# Patient Record
Sex: Female | Born: 1950 | Race: Black or African American | Hispanic: No | Marital: Single | Smoking: Never smoker
Health system: Southern US, Community
[De-identification: ages and names within clinical notes are randomized; demographics above are authoritative.]

## PROBLEM LIST (undated history)

## (undated) DIAGNOSIS — J42 Unspecified chronic bronchitis: Secondary | ICD-10-CM

## (undated) DIAGNOSIS — T7840XA Allergy, unspecified, initial encounter: Secondary | ICD-10-CM

## (undated) DIAGNOSIS — E119 Type 2 diabetes mellitus without complications: Secondary | ICD-10-CM

## (undated) DIAGNOSIS — M1990 Unspecified osteoarthritis, unspecified site: Secondary | ICD-10-CM

## (undated) DIAGNOSIS — I10 Essential (primary) hypertension: Secondary | ICD-10-CM

## (undated) DIAGNOSIS — B019 Varicella without complication: Secondary | ICD-10-CM

## (undated) DIAGNOSIS — I2699 Other pulmonary embolism without acute cor pulmonale: Secondary | ICD-10-CM

## (undated) DIAGNOSIS — I809 Phlebitis and thrombophlebitis of unspecified site: Secondary | ICD-10-CM

## (undated) DIAGNOSIS — R42 Dizziness and giddiness: Secondary | ICD-10-CM

## (undated) HISTORY — PX: OTHER SURGICAL HISTORY: SHX169

## (undated) HISTORY — DX: Phlebitis and thrombophlebitis of unspecified site: I80.9

## (undated) HISTORY — DX: Unspecified chronic bronchitis: J42

## (undated) HISTORY — PX: TONSILECTOMY, ADENOIDECTOMY, BILATERAL MYRINGOTOMY AND TUBES: SHX2538

## (undated) HISTORY — DX: Unspecified osteoarthritis, unspecified site: M19.90

## (undated) HISTORY — DX: Type 2 diabetes mellitus without complications: E11.9

## (undated) HISTORY — DX: Allergy, unspecified, initial encounter: T78.40XA

## (undated) HISTORY — PX: ABDOMINAL HYSTERECTOMY: SHX81

## (undated) HISTORY — DX: Varicella without complication: B01.9

---

## 1988-09-26 DIAGNOSIS — I2699 Other pulmonary embolism without acute cor pulmonale: Secondary | ICD-10-CM

## 1988-09-26 HISTORY — DX: Other pulmonary embolism without acute cor pulmonale: I26.99

## 2001-06-11 ENCOUNTER — Encounter: Payer: Self-pay | Admitting: Occupational Therapy

## 2001-06-11 ENCOUNTER — Ambulatory Visit (HOSPITAL_COMMUNITY): Admission: RE | Admit: 2001-06-11 | Discharge: 2001-06-11 | Payer: Self-pay | Admitting: Occupational Therapy

## 2002-08-07 ENCOUNTER — Ambulatory Visit (HOSPITAL_COMMUNITY): Admission: RE | Admit: 2002-08-07 | Discharge: 2002-08-07 | Payer: Self-pay | Admitting: General Surgery

## 2002-08-07 ENCOUNTER — Encounter: Payer: Self-pay | Admitting: General Surgery

## 2002-09-25 ENCOUNTER — Ambulatory Visit (HOSPITAL_COMMUNITY): Admission: RE | Admit: 2002-09-25 | Discharge: 2002-09-25 | Payer: Self-pay | Admitting: General Surgery

## 2007-04-12 LAB — HM COLONOSCOPY: HM Colonoscopy: NORMAL

## 2012-10-19 LAB — HM DIABETES EYE EXAM

## 2013-04-11 ENCOUNTER — Ambulatory Visit (INDEPENDENT_AMBULATORY_CARE_PROVIDER_SITE_OTHER): Payer: Commercial Managed Care - PPO | Admitting: Internal Medicine

## 2013-04-11 ENCOUNTER — Encounter: Payer: Self-pay | Admitting: Internal Medicine

## 2013-04-11 VITALS — BP 170/90 | HR 87 | Temp 98.1°F | Resp 12 | Ht 60.0 in | Wt 219.2 lb

## 2013-04-11 DIAGNOSIS — Z1211 Encounter for screening for malignant neoplasm of colon: Secondary | ICD-10-CM

## 2013-04-11 DIAGNOSIS — R5381 Other malaise: Secondary | ICD-10-CM

## 2013-04-11 DIAGNOSIS — E559 Vitamin D deficiency, unspecified: Secondary | ICD-10-CM

## 2013-04-11 DIAGNOSIS — M25569 Pain in unspecified knee: Secondary | ICD-10-CM

## 2013-04-11 DIAGNOSIS — IMO0001 Reserved for inherently not codable concepts without codable children: Secondary | ICD-10-CM

## 2013-04-11 DIAGNOSIS — D649 Anemia, unspecified: Secondary | ICD-10-CM

## 2013-04-11 DIAGNOSIS — E785 Hyperlipidemia, unspecified: Secondary | ICD-10-CM

## 2013-04-11 DIAGNOSIS — E538 Deficiency of other specified B group vitamins: Secondary | ICD-10-CM

## 2013-04-11 DIAGNOSIS — R609 Edema, unspecified: Secondary | ICD-10-CM

## 2013-04-11 DIAGNOSIS — E119 Type 2 diabetes mellitus without complications: Secondary | ICD-10-CM

## 2013-04-11 DIAGNOSIS — I10 Essential (primary) hypertension: Secondary | ICD-10-CM

## 2013-04-11 DIAGNOSIS — Z1239 Encounter for other screening for malignant neoplasm of breast: Secondary | ICD-10-CM

## 2013-04-11 LAB — MICROALBUMIN / CREATININE URINE RATIO
Creatinine,U: 97.5 mg/dL
Microalb Creat Ratio: 0.8 mg/g (ref 0.0–30.0)
Microalb, Ur: 0.8 mg/dL (ref 0.0–1.9)

## 2013-04-11 LAB — COMPREHENSIVE METABOLIC PANEL
ALT: 20 U/L (ref 0–35)
AST: 18 U/L (ref 0–37)
Albumin: 4 g/dL (ref 3.5–5.2)
Alkaline Phosphatase: 71 U/L (ref 39–117)
BUN: 16 mg/dL (ref 6–23)
CO2: 26 mEq/L (ref 19–32)
Calcium: 9.1 mg/dL (ref 8.4–10.5)
Chloride: 103 mEq/L (ref 96–112)
Creatinine, Ser: 0.7 mg/dL (ref 0.4–1.2)
GFR: 114.17 mL/min (ref >60.00)
Glucose, Bld: 147 mg/dL — ABNORMAL HIGH (ref 70–99)
Potassium: 4.2 mEq/L (ref 3.5–5.1)
Sodium: 138 mEq/L (ref 135–145)
Total Bilirubin: 0.2 mg/dL — ABNORMAL LOW (ref 0.3–1.2)
Total Protein: 7.4 g/dL (ref 6.0–8.3)

## 2013-04-11 LAB — CBC WITH DIFFERENTIAL/PLATELET
Basophils Absolute: 0 10*3/uL (ref 0.0–0.1)
Hemoglobin: 12.5 g/dL (ref 12.0–15.0)
Lymphocytes Relative: 29.2 % (ref 12.0–46.0)
Monocytes Relative: 4.5 % (ref 3.0–12.0)
Neutro Abs: 5.8 10*3/uL (ref 1.4–7.7)
Neutrophils Relative %: 65.4 % (ref 43.0–77.0)
RDW: 14.8 % — ABNORMAL HIGH (ref 11.5–14.6)

## 2013-04-11 LAB — HEMOGLOBIN A1C: Hgb A1c MFr Bld: 8.8 % — ABNORMAL HIGH (ref 4.6–6.5)

## 2013-04-11 LAB — LIPID PANEL
Cholesterol: 256 mg/dL — ABNORMAL HIGH (ref 0–200)
HDL: 45.2 mg/dL (ref >39.00)
Total CHOL/HDL Ratio: 6
Triglycerides: 264 mg/dL — ABNORMAL HIGH (ref 0.0–149.0)
VLDL: 52.8 mg/dL — ABNORMAL HIGH (ref 0.0–40.0)

## 2013-04-11 LAB — HM DIABETES FOOT EXAM: HM Diabetic Foot Exam: NORMAL

## 2013-04-11 LAB — IRON AND TIBC: %SAT: 13 % — ABNORMAL LOW (ref 20–55)

## 2013-04-11 LAB — VITAMIN B12: Vitamin B-12: 155 pg/mL — ABNORMAL LOW (ref 211–911)

## 2013-04-11 LAB — FERRITIN: Ferritin: 56.9 ng/mL (ref 10.0–291.0)

## 2013-04-11 NOTE — Patient Instructions (Addendum)
You need one calcium tablet daily ,  And 800 units of Vitamin D  Daily unless your level is low  We are checking your labs today and will set you up for mammogram  If you need to start medications, we will call you and may have you return earlier to discuss medications  Otherwise we will see you in 3 months for your physical   This is  One version of a  "Low GI"  Diet:  It will still lower your blood sugars and allow you to lose 4 to 8  lbs  per month if you follow it carefully.  Your goal with exercise is a minimum of 30 minutes of aerobic exercise 5 days per week (Walking does not count once it becomes easy!)    All of the foods can be found at grocery stores and in bulk at [employer].  The Atkins protein bars and shakes are available in more varieties at Target, WalMart and [employer]'s Foods.     7 AM Breakfast:  Choose from the following:  Low carbohydrate Protein  Shakes (I recommend the EAS AdvantEdge "Carb Control" shakes  Or the low carb shakes by Atkins.    2.5 carbs   Arnold's "Sandwhich Thin"toasted  w/ peanut butter (no jelly: about 20 net carbs  "Bagel Thin" with cream cheese and salmon: about 20 carbs   a scrambled egg/bacon/cheese burrito made with Mission's "carb balance" whole wheat tortilla  (about 10 net carbs )   Avoid cereal and bananas, oatmeal and cream of wheat and grits. They are loaded with carbohydrates!   10 AM: high protein snack  Protein bar by Atkins (the snack size, under 200 cal, usually < 6 net carbs).    A stick of cheese:  Around 1 carb,  100 cal     Dannon Light n Fit [place] Yogurt  (80 cal, 8 carbs)  Other so called "protein bars" and Greek yogurts tend to be loaded with carbohydrates.  Remember, in food advertising, the word "energy" is synonymous for " carbohydrate."  Lunch:   A Sandwich using the bread choices listed, Can use any  Eggs,  lunchmeat, grilled meat or canned tuna), avocado, regular mayo/mustard  and cheese.  A Salad using blue cheese,  ranch,  Goddess or vinagrette,  No croutons or "confetti" and no "candied nuts" but regular nuts OK.   No pretzels or chips.  Pickles and miniature sweet peppers are a good low carb alternative that provide a "crunch"  The bread is the only source of carbohydrate in a sandwich and  can be decreased by trying some of these alternatives to traditional loaf bread  Joseph's makes a pita bread and a flat bread that are 50 cal and 4 net carbs available at BJs and WalMart.  This can be toasted to use with hummous as well  Toufayan makes a low carb flatbread that's 100 cal and 9 net carbs available at [employer] and [employer] makes 2 sizes of  Low carb whole wheat tortilla  (The large one is 210 cal and 6 net carbs) Avoid "Low fat dressings, as well as Reyne Dumas and [address] dressings They are loaded with sugar!   3 PM/ Mid day  Snack:  Consider  1 ounce of  almonds, walnuts, pistachios, pecans, peanuts,  Macadamia nuts or a nut medley.  Avoid "granola"; the dried cranberries and raisins are loaded with carbohydrates. Mixed nuts as long as there are no raisins,  cranberries or dried fruit.     6 PM  Dinner:     Meat/fowl/fish with a green salad, and either broccoli, cauliflower, green beans, spinach, brussel sprouts or  Lima beans. DO NOT BREAD THE PROTEIN!!      There is a low carb pasta by Dreamfield's that is acceptable and tastes great: only 5 digestible carbs/serving.( All grocery stores but BJs carry it )  Try Kai Levins Angelo's chicken piccata or chicken or eggplant parm over low carb pasta.(Lowes and BJs)   Clifton Custard Sanchez's "Carnitas" (pulled pork, no sauce,  0 carbs) or his beef pot roast to make a dinner burrito (at BJ's)  Pesto over low carb pasta (bj's sells a good quality pesto in the center refrigerated section of the deli   Whole wheat pasta is still full of digestible carbs and  Not as low in glycemic index as Dreamfield's.   Brown rice is still rice,  So skip the rice and noodles  if you eat [place] or [place] (or at least limit to 1/2 cup)  9 PM snack :   Breyer's "low carb" fudgsicle or  ice cream bar (Carb Smart line), or  Weight Watcher's ice cream bar , or another "no sugar added" ice cream;  a serving of fresh berries/cherries with whipped cream   Cheese or DANNON'S LlGHT N FIT [place] YOGURT  Avoid bananas, pineapple, grapes  and watermelon on a regular basis because they are high in sugar.  THINK OF THEM AS DESSERT  Remember that snack Substitutions should be less than 10 NET carbs per serving and meals < 20 carbs. Remember to subtract fiber grams to get the "net carbs."

## 2013-04-11 NOTE — Progress Notes (Signed)
Patient ID: Jeanette Spencer, female   DOB: 07/28/1951, 62 y.o.   MRN: [id number]    Patient Active Problem List   Diagnosis Date Noted  . Other and unspecified hyperlipidemia 04/13/2013  . Essential hypertension, benign 04/13/2013  . Edema 04/13/2013  . Recurrent knee pain 04/13/2013  . Type II or unspecified type diabetes mellitus without mention of complication, uncontrolled     Subjective:  CC:   Chief Complaint  Patient presents with  . Establish Care    HPI:   Jeanette Spencer is a 62 y.o. female who presents as a new patient to establish primary care with the chief complaint of Multiple complaints related to history of DM2, hypertension, DJD knees, and obesity.   no treatment in 2 years due to loss of insurance.    Bilateral knee pain,  Feels like the patella moves and the catches frequently when she walks. 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  Has had ultrasounds remotely of left leg due to recurrent swelling of left knee and history of post operative DVT/PE.     Leg swelling: Uses compression stockings occasionally when the swelling is more pronounced but not on a daily basis.  Swelling is aggravated periodically by prolonged work hours of standing.    Uncontrolled dm.  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 Not following a low glycemic diet and has gained 37 lbs since then .  No neuropathic symptoms or urinary frequency.  nOt checking sugars .    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   Surgery was done at  [facility]).  She recalls thinking that the DVT may have been present prior to surgery based on history of red and blue  streaks noted in both legs prior to surgery but not evaluated prior to the surgery.  Was taking oral estrogen at the time.  Was anticoagulated for 6 years on coumadin.,  And rstogen odsoe was lowered but not discontinued by PCP.   Self discontinued it 18 years ago (after 6 years) along with the estrogen .   History of pneumonia x 2 as an infant,  Has had recurrent episodes of bronchitis since then,  No tobacco history .  Some allergic rhinitis, no episodes of  sinusitis in 2 years.  Not on a daily antihistamine or steroid nasal spray. Does not have daily symptoms .  History of obesity and Htn which resolved with wt loss years ago  Normal bp reading a few weeks ago at [facility] during an employee health screen .   Past Medical History  Diagnosis Date  . Arthritis   . Chronic bronchitis   . Allergy   . Phlebitis   . Chicken pox   . Diabetes mellitus without complication     Past Surgical History  Procedure Laterality Date  . Abdominal hysterectomy    . Tonsilectomy, adenoidectomy, bilateral myringotomy and tubes    . Abdominal tumor Left     size of grapefruit    Family History  Problem Relation Age of Onset  . Diabetes  Mother   . Cancer Father   . Cancer Paternal Aunt   . Cancer Paternal Uncle   . Diabetes Paternal Grandmother     History   Social History  . Marital Status: Legally Separated    Spouse Name: N/A    Number of Children: N/A  . Years of Education: N/A   Occupational History  . Not on file.   Social History Main Topics  . Smoking status: Never Smoker   . Smokeless tobacco: Never Used  . Alcohol Use: Yes     Comment: rarely  . Drug Use: No  . Sexually Active: No   Other Topics Concern  . Not on file   Social History Narrative  . No narrative on file       @ALLHX @    Review of Systems:   The remainder of the review of systems was negative except those addressed in the HPI.       Objective:  BP 170/90  Pulse 87  Temp(Src) 98.1 F  (36.7 C) (Oral)  Resp 12  Ht 5' (1.524 m)  Wt 219 lb 4 oz (99.451 kg)  BMI 42.82 kg/m2  SpO2 98%  General appearance: alert, cooperative and appears stated age Ears: normal TM's and external ear canals both ears Throat: lips, mucosa, and tongue normal; teeth and gums normal Neck: no adenopathy, no carotid bruit, supple, symmetrical, trachea midline and thyroid not enlarged, symmetric, no tenderness/mass/nodules Back: symmetric, no curvature. ROM normal. No CVA tenderness. Lungs: clear to auscultation bilaterally Heart: regular rate and rhythm, S1, S2 normal, no murmur, click, rub or gallop Abdomen: soft, non-tender; bowel sounds normal; no masses,  no organomegaly Pulses: 2+ and symmetric Skin: Skin color, texture, turgor normal. No rashes or lesions Lymph nodes: Cervical, supraclavicular, and axillary nodes normal.  Assessment and Plan:  Type II or unspecified type diabetes mellitus without mention of complication, uncontrolled a1c 8.8 secondary to lapse in medications and nonadherence to low glycemic index diet.  Her fasting glucose today is 147. She has normal renal function mild hyperlipidemia and obesity. She has no proteinuria but does have hypertension. We will resume metformin, start atorvastatin and lisinopril and have her return  In 6 weeks  For repeat cMET and lipids along with a log of fasting and postprandial blood sugars. We will consider adding additional therapy with glipizide if her  postprandial sugars are continually 150. She will be offered referral to diabetes education for nutritional counseling and weight management . She was counseled on the need to follow a low glycemic index diet and printed example was  given to her today.  I have recommended starting a daily baby aspirin. Referral for diabetic eye exam in process.  Other and unspecified hyperlipidemia Her untreated LDL 166 /  Trigs 260.  I am Recommending therapy with  lipitor 20 mg given concurrent DM and HTN.  She will return in 6 weeks for repeat fasting lipids and CMET  Essential hypertension, benign She has no evidence of proteinuria today but given her history of diabetes and hyperlipidemia recommend starting lisinopril 10 mg daily.  Edema Exam is consistent with venous insufficiency. She has a history of DVT remotely. She is no signs of heart failure on exam.  Normal pulses.. She has no proteinuria. Will confirmed with ultrasounds of both legs. Recommended continued use of compression stockings on a daily basis.  Recurrent knee pain She has no effusions on exam but does have some mild crepitus. Likely DJD as a cause. Will  defer workup for now. When necessary Aleve and Tylenol.   Updated Medication List Outpatient Encounter Prescriptions as of 04/11/2013  Medication Sig Dispense Refill  . atorvastatin (LIPITOR) 20 MG tablet Take 1 tablet (20 mg total) by mouth daily.  90 tablet  3  . lisinopril (PRINIVIL,ZESTRIL) 10 MG tablet Take 1 tablet (10 mg total) by mouth daily.  90 tablet  3  . metFORMIN (GLUCOPHAGE) 850 MG tablet Take 1 tablet (850 mg total) by mouth 2 (two) times daily with a meal.  180 tablet  3   No facility-administered encounter medications on file as of 04/11/2013.     Orders Placed This Encounter  Procedures  . Fecal occult blood, imunochemical  . MM Digital Screening  . HM MAMMOGRAPHY  . Lipid panel  . Hemoglobin A1c  . Microalbumin / creatinine urine ratio  . Comprehensive metabolic panel  . TSH  . CBC with Differential  . Vitamin B12  . Vitamin D 25 hydroxy  . Ferritin  . Iron and TIBC  . LDL cholesterol, direct  . Ambulatory referral to Ophthalmology  . Ambulatory referral to diabetic education  . HM DIABETES FOOT EXAM  . HM COLONOSCOPY    No Follow-up on file.

## 2013-04-12 LAB — VITAMIN D 25 HYDROXY (VIT D DEFICIENCY, FRACTURES): Vit D, 25-Hydroxy: 24 ng/mL — ABNORMAL LOW (ref 30–89)

## 2013-04-12 LAB — TSH: TSH: 1.68 u[IU]/mL (ref 0.35–5.50)

## 2013-04-12 LAB — LDL CHOLESTEROL, DIRECT: Direct LDL: 166.7 mg/dL

## 2013-04-13 ENCOUNTER — Encounter: Payer: Self-pay | Admitting: Internal Medicine

## 2013-04-13 DIAGNOSIS — M25569 Pain in unspecified knee: Secondary | ICD-10-CM | POA: Insufficient documentation

## 2013-04-13 DIAGNOSIS — E785 Hyperlipidemia, unspecified: Secondary | ICD-10-CM | POA: Insufficient documentation

## 2013-04-13 DIAGNOSIS — E1121 Type 2 diabetes mellitus with diabetic nephropathy: Secondary | ICD-10-CM | POA: Insufficient documentation

## 2013-04-13 DIAGNOSIS — R609 Edema, unspecified: Secondary | ICD-10-CM | POA: Insufficient documentation

## 2013-04-13 DIAGNOSIS — I10 Essential (primary) hypertension: Secondary | ICD-10-CM | POA: Insufficient documentation

## 2013-04-13 MED ORDER — METFORMIN HCL 850 MG PO TABS: 850.0000 mg | ORAL_TABLET | Freq: Two times a day (BID) | ORAL | Status: DC

## 2013-04-13 MED ORDER — LISINOPRIL 10 MG PO TABS: 10.0000 mg | ORAL_TABLET | Freq: Every day | ORAL | Status: DC

## 2013-04-13 MED ORDER — ATORVASTATIN CALCIUM 20 MG PO TABS: 20.0000 mg | ORAL_TABLET | Freq: Every day | ORAL | Status: DC

## 2013-04-13 NOTE — Assessment & Plan Note (Signed)
She has no effusions on exam but does have some mild crepitus. Likely DJD as a cause. Will defer workup for now. When necessary Aleve and Tylenol.

## 2013-04-13 NOTE — Assessment & Plan Note (Signed)
Exam is consistent with venous insufficiency. She has a history of DVT remotely. She is no signs of heart failure on exam.  Normal pulses.. She has no proteinuria. Will confirmed with ultrasounds of both legs. Recommended continued use of compression stockings on a daily basis.

## 2013-04-13 NOTE — Assessment & Plan Note (Addendum)
a1c 8.8 secondary to lapse in medications and nonadherence to low glycemic index diet.  Her fasting glucose today is 147. She has normal renal function mild hyperlipidemia and obesity. She has no proteinuria but does have hypertension. We will resume metformin, start atorvastatin and lisinopril and have her return  In 6 weeks  For repeat cMET and lipids along with a log of fasting and postprandial blood sugars. We will consider adding additional therapy with glipizide if her  postprandial sugars are continually 150. She will be offered referral to diabetes education for nutritional counseling and weight management . She was counseled on the need to follow a low glycemic index diet and printed example was  given to her today.  I have recommended starting a daily baby aspirin. Referral for diabetic eye exam in process.

## 2013-04-13 NOTE — Assessment & Plan Note (Signed)
Her untreated LDL 166 /  Trigs 260.  I am Recommending therapy with  lipitor 20 mg given concurrent DM and HTN. She will return in 6 weeks for repeat fasting lipids and CMET

## 2013-04-13 NOTE — Assessment & Plan Note (Signed)
She has no evidence of proteinuria today but given her history of diabetes and hyperlipidemia recommend starting lisinopril 10 mg daily.

## 2013-04-14 ENCOUNTER — Encounter: Payer: Self-pay | Admitting: Internal Medicine

## 2013-04-15 ENCOUNTER — Encounter: Payer: Self-pay | Admitting: Emergency Medicine

## 2013-04-30 ENCOUNTER — Encounter: Payer: Self-pay | Admitting: *Deleted

## 2013-05-06 ENCOUNTER — Encounter: Payer: Self-pay | Admitting: Internal Medicine

## 2013-05-23 ENCOUNTER — Encounter: Payer: Self-pay | Admitting: Internal Medicine

## 2013-05-23 ENCOUNTER — Ambulatory Visit (INDEPENDENT_AMBULATORY_CARE_PROVIDER_SITE_OTHER): Payer: Commercial Managed Care - PPO | Admitting: *Deleted

## 2013-05-23 ENCOUNTER — Ambulatory Visit (INDEPENDENT_AMBULATORY_CARE_PROVIDER_SITE_OTHER): Payer: Commercial Managed Care - PPO | Admitting: Internal Medicine

## 2013-05-23 VITALS — BP 156/84 | HR 66 | Temp 97.6°F | Resp 14 | Wt 211.2 lb

## 2013-05-23 DIAGNOSIS — E785 Hyperlipidemia, unspecified: Secondary | ICD-10-CM

## 2013-05-23 DIAGNOSIS — Z888 Allergy status to other drugs, medicaments and biological substances status: Secondary | ICD-10-CM

## 2013-05-23 DIAGNOSIS — I10 Essential (primary) hypertension: Secondary | ICD-10-CM

## 2013-05-23 DIAGNOSIS — Z789 Other specified health status: Secondary | ICD-10-CM

## 2013-05-23 DIAGNOSIS — E538 Deficiency of other specified B group vitamins: Secondary | ICD-10-CM | POA: Insufficient documentation

## 2013-05-23 DIAGNOSIS — Z1211 Encounter for screening for malignant neoplasm of colon: Secondary | ICD-10-CM

## 2013-05-23 LAB — FECAL OCCULT BLOOD, IMMUNOCHEMICAL: Fecal Occult Bld: NEGATIVE

## 2013-05-23 MED ORDER — CYANOCOBALAMIN 1000 MCG/ML IJ SOLN
1000.0000 ug | Freq: Once | INTRAMUSCULAR | Status: AC
  Administered 2013-05-23: 1000 ug via INTRAMUSCULAR

## 2013-05-23 MED ORDER — CYANOCOBALAMIN 1000 MCG/ML IJ SOLN: 1000.0000 ug | Freq: Once | INTRAMUSCULAR | Status: DC

## 2013-05-23 MED ORDER — SYRINGE (DISPOSABLE) 1 ML MISC: 1.0000 | Status: DC

## 2013-05-23 MED ORDER — GLUCOSE BLOOD VI STRP: ORAL_STRIP | Status: DC

## 2013-05-23 MED ORDER — METFORMIN HCL 500 MG PO TABS: 500.0000 mg | ORAL_TABLET | Freq: Two times a day (BID) | ORAL | Status: DC

## 2013-05-23 NOTE — Assessment & Plan Note (Addendum)
She is Not tolerating metformin 850 mg bid due to diarrhea.  I have reduced the dose. She her cut her pills in half. Have also recommended a low glycemic index diet and have asked her start checking her blood sugars twice daily. She is reminded to take a baby aspirin daily and start the ACE inhibitor. We will repeat her cholesterol in 3 months and start statin therapy at that time if indicated.

## 2013-05-23 NOTE — Progress Notes (Signed)
Patient ID: Jeanette Spencer, female   DOB: 1951/02/18, 62 y.o.   MRN: [id number]   Patient Active Problem List   Diagnosis Date Noted  . Statin intolerance 05/23/2013  . B12 deficiency 05/23/2013  . Other and unspecified hyperlipidemia 04/13/2013  . Essential hypertension, benign 04/13/2013  . Edema 04/13/2013  . Recurrent knee pain 04/13/2013  . Type II or unspecified type diabetes mellitus without mention of complication, uncontrolled     Subjective:  CC:   Chief Complaint  Patient presents with  . Follow-up    6 week    HPI:   Ellicia A Watkinsis a 62 y.o. female who presents for follow up on uncontrolled dm,  Hyperlipidemia, hypertension obesity. Patient had been lost to followup and had not been taking any medications for nearly a year when she presented lat month  She was started on metformin, lisinopril and lipitor last month for A1c greater than 8. She has been having daily gi upset with metformin despite using it for a month, including   Nocturnal loose stools she is not checking her blood sugars because she is rather strips. She also did not start the cholesterol medication.   Past Medical History  Diagnosis Date  . Arthritis   . Chronic bronchitis   . Allergy   . Phlebitis   . Chicken pox   . Diabetes mellitus without complication     Past Surgical History  Procedure Laterality Date  . Abdominal hysterectomy    . Tonsilectomy, adenoidectomy, bilateral myringotomy and tubes    . Abdominal tumor Left     size of grapefruit       The following portions of the patient's history were reviewed and updated as appropriate: Allergies, current medications, and problem list.    Review of Systems:   12 Pt  review of systems was negative except those addressed in the HPI,     History   Social History  . Marital Status: Legally Separated    Spouse Name: N/A    Number of Children: N/A  . Years of Education: N/A   Occupational History  . Not on file.    Social History Main Topics  . Smoking status: Never Smoker   . Smokeless tobacco: Never Used  . Alcohol Use: Yes     Comment: rarely  . Drug Use: No  . Sexual Activity: No   Other Topics Concern  . Not on file   Social History Narrative  . No narrative on file    Objective:  Filed Vitals:   05/23/13 0926  BP: 156/84  Pulse: 66  Temp: 97.6 F (36.4 C)  Resp: 14     General appearance: alert, cooperative and appears stated age Ears: normal TM's and external ear canals both ears Throat: lips, mucosa, and tongue normal; teeth and gums normal Neck: no adenopathy, no carotid bruit, supple, symmetrical, trachea midline and thyroid not enlarged, symmetric, no tenderness/mass/nodules Back: symmetric, no curvature. ROM normal. No CVA tenderness. Lungs: clear to auscultation bilaterally Heart: regular rate and rhythm, S1, S2 normal, no murmur, click, rub or gallop Abdomen: soft, non-tender; bowel sounds normal; no masses,  no organomegaly Pulses: 2+ and symmetric Skin: Skin color, texture, turgor normal. No rashes or lesions Lymph nodes: Cervical, supraclavicular, and axillary nodes normal.  Assessment and Plan:  Type II or unspecified type diabetes mellitus without mention of complication, uncontrolled She is Not tolerating metformin 850 mg bid due to diarrhea.  I have reduced the dose. She her cut  her pills in half. Have also recommended a low glycemic index diet and have asked her start checking her blood sugars twice daily. She is reminded to take a baby aspirin daily and start the ACE inhibitor. We will repeat her cholesterol in 3 months and start statin therapy at that time if indicated.  B12 deficiency She would prefer to use non-injection therapy; however given her use of metformin I'm recommending that we start off with IM injections and switched to sublingual after several weeks.  Essential hypertension, benign She was prescribed lisinopril 10 mg daily at last visit  one month ago and blood pressure has improved but is not at goal yet. We'll add HCTA to lisinopril   Other and unspecified hyperlipidemia Untreated secondary to statin intolerance. We will repeat in 3 months.   Updated Medication List Outpatient Encounter Prescriptions as of 05/23/2013  Medication Sig Dispense Refill  . metFORMIN (GLUCOPHAGE) 500 MG tablet Take 1 tablet (500 mg total) by mouth 2 (two) times daily with a meal.  60 tablet  3  . [DISCONTINUED] metFORMIN (GLUCOPHAGE) 850 MG tablet Take 1 tablet (850 mg total) by mouth 2 (two) times daily with a meal.  180 tablet  3  . cyanocobalamin (,VITAMIN B-12,) 1000 MCG/ML injection Inject 1 mL (1,000 mcg total) into the muscle once. I ml IM injection monthly  10 mL  2  . glucose blood test strip Use as instructed to check sugars twice daily.  Uncontrolled diabetes mellitus  100 each  12  . hydrochlorothiazide (HYDRODIURIL) 25 MG tablet Take 1 tablet (25 mg total) by mouth daily.  90 tablet  3  . lisinopril (PRINIVIL,ZESTRIL) 10 MG tablet Take 1 tablet (10 mg total) by mouth daily.  90 tablet  3  . Syringe, Disposable, 1 ML MISC 1 Syringe by Does not apply route once a week. For b12 injections  25 each  2  . [DISCONTINUED] atorvastatin (LIPITOR) 20 MG tablet Take 1 tablet (20 mg total) by mouth daily.  90 tablet  3  . [EXPIRED] cyanocobalamin ((VITAMIN B-12)) injection 1,000 mcg        No facility-administered encounter medications on file as of 05/23/2013.     Orders Placed This Encounter  Procedures  . HM DIABETES EYE EXAM    No Follow-up on file.

## 2013-05-23 NOTE — Patient Instructions (Addendum)
You may cut the metformin in half and take it twice daily  Please check your blood sugars twice daily:  1) fasting and 2) 2 hours after a meal (you can vary from day to day which meal you check)  Send me your blood sugars in 2 weeks through Mychart or fax/call the office. If your sugars are still too high we will add generic glipizide   You received a B12 injection today because you B12 is low .  You need to give yourself a B12 injection weekly for the next 3 weeks,  Then monthly  You do not need iron supplements unless you iron has not improved at next check  Return in late October for repeat fasting labs followed by an office visit.

## 2013-05-24 ENCOUNTER — Encounter: Payer: Self-pay | Admitting: Internal Medicine

## 2013-05-24 MED ORDER — HYDROCHLOROTHIAZIDE 25 MG PO TABS: 25.0000 mg | ORAL_TABLET | Freq: Every day | ORAL | Status: DC

## 2013-05-24 NOTE — Assessment & Plan Note (Addendum)
She was prescribed lisinopril 10 mg daily at last visit one month ago and blood pressure has improved but is not at goal yet. We'll add HCTA to lisinopril

## 2013-05-24 NOTE — Assessment & Plan Note (Signed)
Untreated secondary to statin intolerance. We will repeat in 3 months.

## 2013-05-24 NOTE — Assessment & Plan Note (Signed)
She would prefer to use non-injection therapy; however given her use of metformin I'm recommending that we start off with IM injections and switched to sublingual after several weeks.

## 2013-05-25 ENCOUNTER — Encounter: Payer: Self-pay | Admitting: Internal Medicine

## 2013-06-28 ENCOUNTER — Telehealth: Payer: Self-pay | Admitting: Internal Medicine

## 2013-06-28 NOTE — Telephone Encounter (Signed)
Left message per DPR for patient to call office.  

## 2013-06-28 NOTE — Telephone Encounter (Signed)
[facility] has reported that Her mammogram was abnormal on the left .   She will be contacted to get additional films and an ultrasound the facility. If she has not heard from them by Monday let us know

## 2013-07-04 ENCOUNTER — Telehealth: Payer: Self-pay | Admitting: *Deleted

## 2013-07-04 NOTE — Telephone Encounter (Signed)
Pt is coming in for labs tomorrow 10.10.2014 what labs and dx?

## 2013-07-04 NOTE — Telephone Encounter (Signed)
Tell patient she might as well wait a week since she was supposed to come back for fasting labs in late August.  We cannot check her 1qc until Oct17th.  So delay her lab appt until then

## 2013-07-04 NOTE — Telephone Encounter (Signed)
Called to let pt know she can wait til the 10.17.2014 but didn't get an answer from none of the number

## 2013-07-05 ENCOUNTER — Telehealth: Payer: Self-pay | Admitting: Internal Medicine

## 2013-07-05 ENCOUNTER — Other Ambulatory Visit: Payer: Commercial Managed Care - PPO

## 2013-07-05 NOTE — Telephone Encounter (Signed)
Left message for pt to return my call.

## 2013-07-12 ENCOUNTER — Ambulatory Visit (INDEPENDENT_AMBULATORY_CARE_PROVIDER_SITE_OTHER): Payer: Commercial Managed Care - PPO | Admitting: Internal Medicine

## 2013-07-12 ENCOUNTER — Encounter: Payer: Self-pay | Admitting: Internal Medicine

## 2013-07-12 VITALS — BP 140/76 | HR 77 | Temp 97.9°F | Resp 12 | Wt 204.0 lb

## 2013-07-12 DIAGNOSIS — Z23 Encounter for immunization: Secondary | ICD-10-CM

## 2013-07-12 DIAGNOSIS — Z888 Allergy status to other drugs, medicaments and biological substances status: Secondary | ICD-10-CM

## 2013-07-12 DIAGNOSIS — Z789 Other specified health status: Secondary | ICD-10-CM

## 2013-07-12 DIAGNOSIS — IMO0001 Reserved for inherently not codable concepts without codable children: Secondary | ICD-10-CM

## 2013-07-12 DIAGNOSIS — I10 Essential (primary) hypertension: Secondary | ICD-10-CM

## 2013-07-12 DIAGNOSIS — E785 Hyperlipidemia, unspecified: Secondary | ICD-10-CM

## 2013-07-12 DIAGNOSIS — E538 Deficiency of other specified B group vitamins: Secondary | ICD-10-CM

## 2013-07-12 LAB — COMPREHENSIVE METABOLIC PANEL
AST: 15 U/L (ref 0–37)
Albumin: 3.9 g/dL (ref 3.5–5.2)
BUN: 19 mg/dL (ref 6–23)
Calcium: 9.3 mg/dL (ref 8.4–10.5)
Chloride: 105 mEq/L (ref 96–112)
Creatinine, Ser: 0.6 mg/dL (ref 0.4–1.2)
GFR: 120.27 mL/min (ref >60.00)
Glucose, Bld: 109 mg/dL — ABNORMAL HIGH (ref 70–99)
Potassium: 4.6 mEq/L (ref 3.5–5.1)
Sodium: 141 mEq/L (ref 135–145)
Total Bilirubin: 0.5 mg/dL (ref 0.3–1.2)

## 2013-07-12 LAB — LIPID PANEL
Cholesterol: 233 mg/dL — ABNORMAL HIGH (ref 0–200)
Total CHOL/HDL Ratio: 4
Triglycerides: 146 mg/dL (ref 0.0–149.0)

## 2013-07-12 LAB — LDL CHOLESTEROL, DIRECT: Direct LDL: 159.8 mg/dL

## 2013-07-12 NOTE — Progress Notes (Signed)
Patient ID: Jeanette Spencer, female   DOB: Aug 27, 1951, 62 y.o.   MRN: [id number]   Patient Active Problem List   Diagnosis Date Noted  . Statin intolerance 05/23/2013  . B12 deficiency 05/23/2013  . Other and unspecified hyperlipidemia 04/13/2013  . Essential hypertension, benign 04/13/2013  . Edema 04/13/2013  . Recurrent knee pain 04/13/2013  . Type II or unspecified type diabetes mellitus without mention of complication, uncontrolled     Subjective:  CC:   Chief Complaint  Patient presents with  . Follow-up    3 month followup    HPI:   Jeanette A Watkinsis a 62 y.o. female who presents Follow up on hypertension, diabetes hyperlipidemia and obesity.  She has had an intentional 15 lb weight loss since July.  Was referred and went to [facility] pharmacy for diabetes refresher course with follow up planned   Blood sugars have been < 120 in the morning fasting,  And her post prandial evenings/bedtime sugars have been < 100.  No hypoglycemic symptoms or known episodes.   Tolerating all medications  without cough .  Statin intolerant due to myalgias.   Past Medical History  Diagnosis Date  . Arthritis   . Chronic bronchitis   . Allergy   . Phlebitis   . Chicken pox   . Diabetes mellitus without complication     Past Surgical History  Procedure Laterality Date  . Abdominal hysterectomy    . Tonsilectomy, adenoidectomy, bilateral myringotomy and tubes    . Abdominal tumor Left     size of grapefruit       The following portions of the patient's history were reviewed and updated as appropriate: Allergies, current medications, and problem list.    Review of Systems:   12 Pt  review of systems was negative except those addressed in the HPI,     History   Social History  . Marital Status: Legally Separated    Spouse Name: N/A    Number of Children: N/A  . Years of Education: N/A   Occupational History  . Not on file.   Social History Main Topics  . Smoking  status: Never Smoker   . Smokeless tobacco: Never Used  . Alcohol Use: Yes     Comment: rarely  . Drug Use: No  . Sexual Activity: No   Other Topics Concern  . Not on file   Social History Narrative  . No narrative on file    Objective:  Filed Vitals:   07/12/13 1008  BP: 140/76  Pulse: 77  Temp: 97.9 F (36.6 C)  Resp: 12     General appearance: alert, cooperative and appears stated age Ears: normal TM's and external ear canals both ears Throat: lips, mucosa, and tongue normal; teeth and gums normal Neck: no adenopathy, no carotid bruit, supple, symmetrical, trachea midline and thyroid not enlarged, symmetric, no tenderness/mass/nodules Back: symmetric, no curvature. ROM normal. No CVA tenderness. Lungs: clear to auscultation bilaterally Heart: regular rate and rhythm, S1, S2 normal, no murmur, click, rub or gallop Abdomen: soft, non-tender; bowel sounds normal; no masses,  no organomegaly Pulses: 2+ and symmetric Skin: Skin color, texture, turgor normal. No rashes or lesions Lymph nodes: Cervical, supraclavicular, and axillary nodes normal.  Assessment and Plan:  Statin intolerance Lab Results  Component Value Date   CHOL 233* 07/12/2013   HDL 52.80 07/12/2013   LDLDIRECT 159.8 07/12/2013   TRIG 146.0 07/12/2013   CHOLHDL 4 07/12/2013    Other and unspecified  hyperlipidemia Lab Results  Component Value Date   CHOL 233* 07/12/2013   HDL 52.80 07/12/2013   LDLDIRECT 159.8 07/12/2013   TRIG 146.0 07/12/2013   CHOLHDL 4 07/12/2013   Untreated LDL is 160,  Secondary to statin intolerance .  No prior trial of wellchol.  Will repeat in 3 months and if still > 100 recommend rial of wellchol  Type II or unspecified type diabetes mellitus without mention of complication, uncontrolled Improving blood sugars  .A1c improved as well  . Patient is up-to-date on eye exams and foot exam was done today.  There is  no proteinuria on today's micro urinalysis .  Fasting  lipids have been reviewed and statin therapy has not been tolerated in trials past.   Lab Results  Component Value Date   HGBA1C 7.2* 07/12/2013    B12 deficiency Injections started. Now on monthly IM injections.   Essential hypertension, benign Well controlled on current regimen. Renal function stable, no changes today. Lab Results  Component Value Date   CREATININE 0.6 07/12/2013    Updated Medication List Outpatient Encounter Prescriptions as of 07/12/2013  Medication Sig Dispense Refill  . cyanocobalamin (,VITAMIN B-12,) 1000 MCG/ML injection Inject 1 mL (1,000 mcg total) into the muscle once. I ml IM injection monthly  10 mL  2  . glucose blood test strip Use as instructed to check sugars twice daily.  Uncontrolled diabetes mellitus  100 each  12  . [EXPIRED] metFORMIN (GLUCOPHAGE) 500 MG tablet Take 850 mg by mouth daily with breakfast.      . metFORMIN (GLUCOPHAGE) 850 MG tablet Take 850 mg by mouth daily with breakfast.      . Syringe, Disposable, 1 ML MISC 1 Syringe by Does not apply route once a week. For b12 injections  25 each  2  . [DISCONTINUED] metFORMIN (GLUCOPHAGE) 500 MG tablet Take 1 tablet (500 mg total) by mouth 2 (two) times daily with a meal.  60 tablet  3  . hydrochlorothiazide (HYDRODIURIL) 25 MG tablet Take 1 tablet (25 mg total) by mouth daily.  90 tablet  3  . lisinopril (PRINIVIL,ZESTRIL) 10 MG tablet Take 1 tablet (10 mg total) by mouth daily.  90 tablet  3   No facility-administered encounter medications on file as of 07/12/2013.     Orders Placed This Encounter  Procedures  . Pneumococcal conjugate vaccine 13-valent less than 5yo IM  . Tdap vaccine greater than or equal to 7yo IM  . LDL cholesterol, direct    No Follow-up on file.

## 2013-07-12 NOTE — Patient Instructions (Addendum)
If your 2 hr post prandial sugar is > 150 on the shake,  Reduce the bananas or the grapes in the shake (they are high glycemic index)   You can add splenda if you need a sweetener   You had the TDaP and the Prevnar vaccines today ,    Return in 3 months

## 2013-07-14 NOTE — Assessment & Plan Note (Addendum)
Injections started. Now on monthly IM injections.

## 2013-07-14 NOTE — Assessment & Plan Note (Addendum)
Lab Results  Component Value Date   CHOL 233* 07/12/2013   HDL 52.80 07/12/2013   LDLDIRECT 159.8 07/12/2013   TRIG 146.0 07/12/2013   CHOLHDL 4 07/12/2013   Untreated LDL is 160,  Secondary to statin intolerance .  No prior trial of wellchol.  Will repeat in 3 months and if still > 100 recommend rial of wellchol

## 2013-07-14 NOTE — Assessment & Plan Note (Signed)
Well controlled on current regimen. Renal function stable, no changes today. Lab Results  Component Value Date   CREATININE 0.6 07/12/2013

## 2013-07-14 NOTE — Assessment & Plan Note (Signed)
Improving blood sugars  .A1c improved as well  . Patient is up-to-date on eye exams and foot exam was done today.  There is  no proteinuria on today's micro urinalysis .  Fasting lipids have been reviewed and statin therapy has not been tolerated in trials past.   Lab Results  Component Value Date   HGBA1C 7.2* 07/12/2013

## 2013-07-14 NOTE — Assessment & Plan Note (Signed)
Lab Results  Component Value Date   CHOL 233* 07/12/2013   HDL 52.80 07/12/2013   LDLDIRECT 159.8 07/12/2013   TRIG 146.0 07/12/2013   CHOLHDL 4 07/12/2013

## 2013-07-15 ENCOUNTER — Encounter: Payer: Self-pay | Admitting: Internal Medicine

## 2013-07-17 NOTE — Telephone Encounter (Signed)
Mailed unread message to pt  

## 2013-10-18 ENCOUNTER — Ambulatory Visit (INDEPENDENT_AMBULATORY_CARE_PROVIDER_SITE_OTHER): Payer: Commercial Managed Care - PPO | Admitting: Internal Medicine

## 2013-10-18 ENCOUNTER — Encounter: Payer: Self-pay | Admitting: Internal Medicine

## 2013-10-18 VITALS — BP 162/82 | HR 81 | Temp 98.1°F | Resp 18 | Wt 201.5 lb

## 2013-10-18 DIAGNOSIS — J069 Acute upper respiratory infection, unspecified: Secondary | ICD-10-CM

## 2013-10-18 DIAGNOSIS — I10 Essential (primary) hypertension: Secondary | ICD-10-CM

## 2013-10-18 DIAGNOSIS — IMO0001 Reserved for inherently not codable concepts without codable children: Secondary | ICD-10-CM

## 2013-10-18 DIAGNOSIS — E1165 Type 2 diabetes mellitus with hyperglycemia: Secondary | ICD-10-CM

## 2013-10-18 DIAGNOSIS — E119 Type 2 diabetes mellitus without complications: Secondary | ICD-10-CM

## 2013-10-18 DIAGNOSIS — E785 Hyperlipidemia, unspecified: Secondary | ICD-10-CM

## 2013-10-18 LAB — MICROALBUMIN / CREATININE URINE RATIO
CREATININE, U: 153.5 mg/dL
MICROALB/CREAT RATIO: 0.7 mg/g (ref 0.0–30.0)
Microalb, Ur: 1.1 mg/dL (ref 0.0–1.9)

## 2013-10-18 LAB — COMPREHENSIVE METABOLIC PANEL
ALBUMIN: 3.9 g/dL (ref 3.5–5.2)
ALT: 18 U/L (ref 0–35)
AST: 17 U/L (ref 0–37)
Alkaline Phosphatase: 58 U/L (ref 39–117)
BUN: 16 mg/dL (ref 6–23)
CHLORIDE: 105 meq/L (ref 96–112)
CO2: 28 mEq/L (ref 19–32)
Calcium: 9.2 mg/dL (ref 8.4–10.5)
Creatinine, Ser: 0.7 mg/dL (ref 0.4–1.2)
GFR: 108.36 mL/min (ref >60.00)
Glucose, Bld: 108 mg/dL — ABNORMAL HIGH (ref 70–99)
POTASSIUM: 4.4 meq/L (ref 3.5–5.1)
Sodium: 140 mEq/L (ref 135–145)
Total Bilirubin: 0.4 mg/dL (ref 0.3–1.2)
Total Protein: 7.5 g/dL (ref 6.0–8.3)

## 2013-10-18 LAB — HM DIABETES FOOT EXAM: HM Diabetic Foot Exam: NORMAL

## 2013-10-18 LAB — HEMOGLOBIN A1C: Hgb A1c MFr Bld: 6.9 % — ABNORMAL HIGH (ref 4.6–6.5)

## 2013-10-18 LAB — LDL CHOLESTEROL, DIRECT: Direct LDL: 161.4 mg/dL

## 2013-10-18 NOTE — Patient Instructions (Signed)
Your blood pressure is very high today. Please have it checked a few times over the next two weeks at work and let me know the results   You have a viral  Syndrome .  The post nasal drip is causing your sore throat. Lavage your sinuses twice daily with Simply saline nasal spray. Use benadryl 25 mg every 8 hours and Sudafed PE 10 to 30 mg  every 8 hours to manage the drainage and congestion. Gargle with salt water often for the sore throat. If the throat is no better  In 3 to 4 days OR  if you develop T > 100.4,  Green nasal discharge,  Or facial pain,  Call for an antibiotic.  Managing Your High Blood Pressure Blood pressure is a measurement of how forceful your blood is pressing against the walls of the arteries. Arteries are muscular tubes within the circulatory system. Blood pressure does not stay the same. Blood pressure rises when you are active, excited, or nervous; and it lowers during sleep and relaxation. If the numbers measuring your blood pressure stay above normal most of the time, you are at risk for health problems. High blood pressure (hypertension) is a long-term (chronic) condition in which blood pressure is elevated. A blood pressure reading is recorded as two numbers, such as 120 over 80 (or 120/80). The first, higher number is called the systolic pressure. It is a measure of the pressure in your arteries as the heart beats. The second, lower number is called the diastolic pressure. It is a measure of the pressure in your arteries as the heart relaxes between beats.  Keeping your blood pressure in a normal range is important to your overall health and prevention of health problems, such as heart disease and stroke. When your blood pressure is uncontrolled, your heart has to work harder than normal. High blood pressure is a very common condition in adults because blood pressure tends to rise with age. Men and women are equally likely to have hypertension but at different times in life.  Before age 5, men are more likely to have hypertension. After 64 years of age, women are more likely to have it. Hypertension is especially common in African Americans. This condition often has no signs or symptoms. The cause of the condition is usually not known. Your caregiver can help you come up with a plan to keep your blood pressure in a normal, healthy range. BLOOD PRESSURE STAGES Blood pressure is classified into four stages: normal, prehypertension, stage 1, and stage 2. Your blood pressure reading will be used to determine what type of treatment, if any, is necessary. Appropriate treatment options are tied to these four stages:  Normal  Systolic pressure (mm Hg): below 120.  Diastolic pressure (mm Hg): below 80. Prehypertension  Systolic pressure (mm Hg): 120 to 139.  Diastolic pressure (mm Hg): 80 to 89. Stage1  Systolic pressure (mm Hg): 140 to 159.  Diastolic pressure (mm Hg): 90 to 99. Stage2  Systolic pressure (mm Hg): 160 or above.  Diastolic pressure (mm Hg): 100 or above. RISKS RELATED TO HIGH BLOOD PRESSURE Managing your blood pressure is an important responsibility. Uncontrolled high blood pressure can lead to:  A heart attack.  A stroke.  A weakened blood vessel (aneurysm).  Heart failure.  Kidney damage.  Eye damage.  Metabolic syndrome.  Memory and concentration problems. HOW TO MANAGE YOUR BLOOD PRESSURE Blood pressure can be managed effectively with lifestyle changes and medicines (if needed). Your caregiver  will help you come up with a plan to bring your blood pressure within a normal range. Your plan should include the following: Education  Read all information provided by your caregivers about how to control blood pressure.  Educate yourself on the latest guidelines and treatment recommendations. New research is always being done to further define the risks and treatments for high blood pressure. Lifestylechanges  Control your  weight.  Avoid smoking.  Stay physically active.  Reduce the amount of salt in your diet.  Reduce stress.  Control any chronic conditions, such as high cholesterol or diabetes.  Reduce your alcohol intake. Medicines  Several medicines (antihypertensive medicines) are available, if needed, to bring blood pressure within a normal range. Communication  Review all the medicines you take with your caregiver because there may be side effects or interactions.  Talk with your caregiver about your diet, exercise habits, and other lifestyle factors that may be contributing to high blood pressure.  See your caregiver regularly. Your caregiver can help you create and adjust your plan for managing high blood pressure. RECOMMENDATIONS FOR TREATMENT AND FOLLOW-UP  The following recommendations are based on current guidelines for managing high blood pressure in nonpregnant adults. Use these recommendations to identify the proper follow-up period or treatment option based on your blood pressure reading. You can discuss these options with your caregiver.  Systolic pressure of 627 to 035 or diastolic pressure of 80 to 89: Follow up with your caregiver as directed.  Systolic pressure of 009 to 381 or diastolic pressure of 90 to 100: Follow up with your caregiver within 2 months.  Systolic pressure above 829 or diastolic pressure above 937: Follow up with your caregiver within 1 month.  Systolic pressure above 169 or diastolic pressure above 678: Consider antihypertensive therapy; follow up with your caregiver within 1 week.  Systolic pressure above 938 or diastolic pressure above 101: Begin antihypertensive therapy; follow up with your caregiver within 1 week. Document Released: 06/06/2012 Document Reviewed: 06/06/2012 [facility] Patient Information 2014 [place], [place].

## 2013-10-18 NOTE — Progress Notes (Signed)
Patient ID: Jeanette Spencer, female   DOB: 1950/11/08, 63 y.o.   MRN: [id number]   Patient Active Problem List   Diagnosis Date Noted  . Statin intolerance 05/23/2013  . B12 deficiency 05/23/2013  . Other and unspecified hyperlipidemia 04/13/2013  . Essential hypertension, benign 04/13/2013  . Edema 04/13/2013  . Recurrent knee pain 04/13/2013  . Type II or unspecified type diabetes mellitus without mention of complication, uncontrolled     Subjective:  CC:   Chief Complaint  Patient presents with  . Follow-up  . Diabetes    HPI:   Jeanette A Watkinsis a 63 y.o. female who presents 3 month DM follow up  she has made considerable effort to lose weight and dropped down to 197 pounds but gained a few pounds back due to holidays and concurrent bronchitis which has limited her ability to follow the diet and exercise.   fastings 108  Post prandials also < 125 on metformin once daily.  Since she reduce the metformin to once daily her diarrhea has also improved.    bp is elevated,  Has not checkedit  recently at work    Past Medical History  Diagnosis Date  . Arthritis   . Chronic bronchitis   . Allergy   . Phlebitis   . Chicken pox   . Diabetes mellitus without complication     Past Surgical History  Procedure Laterality Date  . Abdominal hysterectomy    . Tonsilectomy, adenoidectomy, bilateral myringotomy and tubes    . Abdominal tumor Left     size of grapefruit       The following portions of the patient's history were reviewed and updated as appropriate: Allergies, current medications, and problem list.    Review of Systems:   12 Pt  review of systems was negative except those addressed in the HPI,     History   Social History  . Marital Status: Legally Separated    Spouse Name: N/A    Number of Children: N/A  . Years of Education: N/A   Occupational History  . Not on file.   Social History Main Topics  . Smoking status: Never Smoker   .  Smokeless tobacco: Never Used  . Alcohol Use: Yes     Comment: rarely  . Drug Use: No  . Sexual Activity: No   Other Topics Concern  . Not on file   Social History Narrative  . No narrative on file    Objective:  Filed Vitals:   10/18/13 1000  BP: 162/82  Pulse: 81  Temp: 98.1 F (36.7 C)  Resp: 18     General appearance: alert, cooperative and appears stated age HEENT: normal,  No sinus pain,  Pharyngitis . TMs normal Neck: no adenopathy, no carotid bruit, supple, symmetrical, trachea midline and thyroid not enlarged, symmetric, no tenderness/mass/nodules Back: symmetric, no curvature. ROM normal. No CVA tenderness. Lungs: clear to auscultation bilaterally Heart: regular rate and rhythm, S1, S2 normal, no murmur, click, rub or gallop Abdomen: soft, non-tender; bowel sounds normal; no masses,  no organomegaly Pulses: 2+ and symmetric Skin: Skin color, texture, turgor normal. No rashes or lesions Lymph nodes: Cervical, supraclavicular, and axillary nodes normal. Foot exam:  Nails are well trimmed,  No callouses,  Sensation intact to microfilament  Assessment and Plan:  Type II or unspecified type diabetes mellitus without mention of complication, uncontrolled Improving blood sugars  .A1c improved as well  . Patient is up-to-date on eye exams and foot  exam was done today.  There is  no proteinuria on today's micro urinalysis .  Fasting lipids have been reviewed and statin therapy has not been tolerated in trials past.   Lab Results  Component Value Date   HGBA1C 6.9* 10/18/2013   Lab Results  Component Value Date   MICROALBUR 1.1 10/18/2013    Other and unspecified hyperlipidemia Her cholesterol remains untreated with medication due to history of statin intolerance. We will consider trial of WelChol in the future. Lab Results  Component Value Date   CHOL 233* 07/12/2013   HDL 52.80 07/12/2013   LDLDIRECT 161.4 10/18/2013   TRIG 146.0 07/12/2013   CHOLHDL 4  07/12/2013   Lab Results  Component Value Date   ALT 18 10/18/2013   AST 17 10/18/2013   ALKPHOS 58 10/18/2013   BILITOT 0.4 10/18/2013    Essential hypertension, benign Elevated today.  Reviewed list of meds, patient is not taking OTC meds that could be causing,. It.  Have asked patient to recheck bp at home a minimum of 5 times over the next 4 weeks and call readings to office for adjustment of medications.    Lab Results  Component Value Date   MICROALBUR 1.1 10/18/2013   Lab Results  Component Value Date   CREATININE 0.7 10/18/2013    Viral URI Symptoms of  URI are caused by viral infection currently given her current symptoms.   I have explained that in viral URIS, an antibiotic will not help the symptoms and will increase the risk of developing diarrhea. Advised to use oral and nasal decongestants,  Ibuprofen 400 mg and tylenol 650 mq 8 hrs for aches and pains,  tessalon every 8 hours prn cough  Advised to start round of abx only if symptoms worsen to include fevers, facial pain, purulent sputum./drainage.    Updated Medication List Outpatient Encounter Prescriptions as of 10/18/2013  Medication Sig  . cyanocobalamin (,VITAMIN B-12,) 1000 MCG/ML injection Inject 1 mL (1,000 mcg total) into the muscle once. I ml IM injection monthly  . glucose blood test strip Use as instructed to check sugars twice daily.  Uncontrolled diabetes mellitus  . metFORMIN (GLUCOPHAGE) 850 MG tablet Take 850 mg by mouth daily with breakfast.  . Syringe, Disposable, 1 ML MISC 1 Syringe by Does not apply route once a week. For b12 injections  . hydrochlorothiazide (HYDRODIURIL) 25 MG tablet Take 1 tablet (25 mg total) by mouth daily.  [name] lisinopril (PRINIVIL,ZESTRIL) 10 MG tablet Take 1 tablet (10 mg total) by mouth daily.

## 2013-10-18 NOTE — Progress Notes (Signed)
Pre-visit discussion using our clinic review tool. No additional management support is needed unless otherwise documented below in the visit note.  

## 2013-10-20 ENCOUNTER — Encounter: Payer: Self-pay | Admitting: Internal Medicine

## 2013-10-20 DIAGNOSIS — J069 Acute upper respiratory infection, unspecified: Secondary | ICD-10-CM | POA: Insufficient documentation

## 2013-10-20 NOTE — Assessment & Plan Note (Addendum)
Elevated today.  Reviewed list of meds, patient is not taking OTC meds that could be causing,. It.  Have asked patient to recheck bp at home a minimum of 5 times over the next 4 weeks and call readings to office for adjustment of medications.    Lab Results  Component Value Date   MICROALBUR 1.1 10/18/2013   Lab Results  Component Value Date   CREATININE 0.7 10/18/2013

## 2013-10-20 NOTE — Assessment & Plan Note (Signed)
Improving blood sugars  .A1c improved as well  . Patient is up-to-date on eye exams and foot exam was done today.  There is  no proteinuria on today's micro urinalysis .  Fasting lipids have been reviewed and statin therapy has not been tolerated in trials past.   Lab Results  Component Value Date   HGBA1C 6.9* 10/18/2013   Lab Results  Component Value Date   MICROALBUR 1.1 10/18/2013

## 2013-10-20 NOTE — Assessment & Plan Note (Signed)
Her cholesterol remains untreated with medication due to history of statin intolerance. We will consider trial of WelChol in the future. Lab Results  Component Value Date   CHOL 233* 07/12/2013   HDL 52.80 07/12/2013   LDLDIRECT 161.4 10/18/2013   TRIG 146.0 07/12/2013   CHOLHDL 4 07/12/2013   Lab Results  Component Value Date   ALT 18 10/18/2013   AST 17 10/18/2013   ALKPHOS 58 10/18/2013   BILITOT 0.4 10/18/2013

## 2013-10-20 NOTE — Assessment & Plan Note (Signed)
Symptoms of  URI are caused by viral infection currently given her current symptoms.   I have explained that in viral URIS, an antibiotic will not help the symptoms and will increase the risk of developing diarrhea. Advised to use oral and nasal decongestants,  Ibuprofen 400 mg and tylenol 650 mq 8 hrs for aches and pains,  tessalon every 8 hours prn cough  Advised to start round of abx only if symptoms worsen to include fevers, facial pain, purulent sputum./drainage.  

## 2013-10-23 ENCOUNTER — Telehealth: Payer: Self-pay

## 2013-10-23 NOTE — Telephone Encounter (Signed)
Relevant patient education assigned to patient using Emmi. ° °

## 2013-10-30 ENCOUNTER — Telehealth: Payer: Self-pay | Admitting: Internal Medicine

## 2013-10-30 NOTE — Telephone Encounter (Signed)
Relevant patient education assigned to patient using Emmi. ° °

## 2014-04-16 ENCOUNTER — Encounter: Payer: Self-pay | Admitting: *Deleted

## 2014-04-18 NOTE — Telephone Encounter (Signed)
Mailed unread message to pt  

## 2014-05-06 ENCOUNTER — Telehealth: Payer: Self-pay | Admitting: *Deleted

## 2014-05-06 NOTE — Telephone Encounter (Signed)
Left message, notifying pt she is due follow appt with Dr. Derrel Nip as well as fasting lab appt a few days prior. Requested call back to office to schedule

## 2014-09-15 ENCOUNTER — Telehealth: Payer: Self-pay | Admitting: *Deleted

## 2014-09-15 DIAGNOSIS — E785 Hyperlipidemia, unspecified: Principal | ICD-10-CM

## 2014-09-15 DIAGNOSIS — E1169 Type 2 diabetes mellitus with other specified complication: Secondary | ICD-10-CM

## 2014-09-15 NOTE — Telephone Encounter (Signed)
Pt is coming tomorrow what labs and dx?  

## 2014-09-16 ENCOUNTER — Other Ambulatory Visit (INDEPENDENT_AMBULATORY_CARE_PROVIDER_SITE_OTHER): Payer: Commercial Managed Care - PPO

## 2014-09-16 DIAGNOSIS — E1169 Type 2 diabetes mellitus with other specified complication: Secondary | ICD-10-CM

## 2014-09-16 DIAGNOSIS — E785 Hyperlipidemia, unspecified: Secondary | ICD-10-CM

## 2014-09-16 LAB — MICROALBUMIN / CREATININE URINE RATIO
Creatinine,U: 208.3 mg/dL
Microalb Creat Ratio: 0.9 mg/g (ref 0.0–30.0)
Microalb, Ur: 1.8 mg/dL (ref 0.0–1.9)

## 2014-09-16 LAB — LIPID PANEL
Cholesterol: 240 mg/dL — ABNORMAL HIGH (ref 0–200)
HDL: 45.9 mg/dL (ref >39.00)
NonHDL: 194.1
TRIGLYCERIDES: 223 mg/dL — AB (ref 0.0–149.0)
Total CHOL/HDL Ratio: 5
VLDL: 44.6 mg/dL — ABNORMAL HIGH (ref 0.0–40.0)

## 2014-09-16 LAB — COMPREHENSIVE METABOLIC PANEL
ALT: 17 U/L (ref 0–35)
AST: 15 U/L (ref 0–37)
Albumin: 3.8 g/dL (ref 3.5–5.2)
Alkaline Phosphatase: 54 U/L (ref 39–117)
BILIRUBIN TOTAL: 0.7 mg/dL (ref 0.2–1.2)
BUN: 18 mg/dL (ref 6–23)
CO2: 27 meq/L (ref 19–32)
CREATININE: 0.8 mg/dL (ref 0.4–1.2)
Calcium: 9.1 mg/dL (ref 8.4–10.5)
Chloride: 103 mEq/L (ref 96–112)
GFR: 99.78 mL/min (ref >60.00)
GLUCOSE: 135 mg/dL — AB (ref 70–99)
Potassium: 4.3 mEq/L (ref 3.5–5.1)
Sodium: 137 mEq/L (ref 135–145)
TOTAL PROTEIN: 6.9 g/dL (ref 6.0–8.3)

## 2014-09-16 LAB — LDL CHOLESTEROL, DIRECT: Direct LDL: 165.6 mg/dL

## 2014-09-16 LAB — HEMOGLOBIN A1C: Hgb A1c MFr Bld: 7.8 % — ABNORMAL HIGH (ref 4.6–6.5)

## 2014-09-18 ENCOUNTER — Encounter: Payer: Self-pay | Admitting: Internal Medicine

## 2014-09-18 ENCOUNTER — Ambulatory Visit (INDEPENDENT_AMBULATORY_CARE_PROVIDER_SITE_OTHER): Payer: Commercial Managed Care - PPO | Admitting: Internal Medicine

## 2014-09-18 VITALS — BP 140/86 | HR 82 | Temp 98.2°F | Resp 14 | Ht 60.0 in | Wt 207.0 lb

## 2014-09-18 DIAGNOSIS — E1165 Type 2 diabetes mellitus with hyperglycemia: Secondary | ICD-10-CM

## 2014-09-18 DIAGNOSIS — E1169 Type 2 diabetes mellitus with other specified complication: Secondary | ICD-10-CM

## 2014-09-18 DIAGNOSIS — E785 Hyperlipidemia, unspecified: Secondary | ICD-10-CM

## 2014-09-18 DIAGNOSIS — IMO0002 Reserved for concepts with insufficient information to code with codable children: Secondary | ICD-10-CM

## 2014-09-18 DIAGNOSIS — I10 Essential (primary) hypertension: Secondary | ICD-10-CM

## 2014-09-18 DIAGNOSIS — E119 Type 2 diabetes mellitus without complications: Secondary | ICD-10-CM

## 2014-09-18 LAB — HM DIABETES FOOT EXAM: HM Diabetic Foot Exam: NORMAL

## 2014-09-18 MED ORDER — LISINOPRIL 5 MG PO TABS: 5.0000 mg | ORAL_TABLET | Freq: Every day | ORAL | Status: DC

## 2014-09-18 MED ORDER — SYRINGE (DISPOSABLE) 1 ML MISC: 1.0000 | Status: DC

## 2014-09-18 MED ORDER — METFORMIN HCL 850 MG PO TABS: 850.0000 mg | ORAL_TABLET | Freq: Two times a day (BID) | ORAL | Status: DC

## 2014-09-18 MED ORDER — CYANOCOBALAMIN 1000 MCG/ML IJ SOLN: 1000.0000 ug | INTRAMUSCULAR | Status: DC

## 2014-09-18 NOTE — Progress Notes (Signed)
Patient ID: Jeanette Spencer, female   DOB: 07/17/1951, 63 y.o.   MRN: [id number]  Patient Active Problem List   Diagnosis Date Noted  . Statin intolerance 05/23/2013  . B12 deficiency 05/23/2013  . Hyperlipidemia associated with type 2 diabetes mellitus 04/13/2013  . Essential hypertension, benign 04/13/2013  . Edema 04/13/2013  . Recurrent knee pain 04/13/2013  . Type II diabetes mellitus, uncontrolled     Subjective:  CC:   Chief Complaint  Patient presents with  . Follow-up    diabetes     HPI:   Jeanette Spencer is a 63 y.o. female who presents for   Follow up on DM. Type 2 with hyperlipidemia.  She was last  seen January 2015 and has been lost to follow up.  She feels generally well, is not exercising and checking blood sugars once daily at variable times.  BS have been under 1500 fasting and usually up to 180 post prandially.  Denies any recent hypoglyemic events.  Taking ers medications as directed. Following a carbohydrate modified diet 6 days per week. Denies numbness, burning and tingling of extremities. Appetite is good.      Past Medical History  Diagnosis Date  . Arthritis   . Chronic bronchitis   . Allergy   . Phlebitis   . Chicken pox   . Diabetes mellitus without complication     Past Surgical History  Procedure Laterality Date  . Abdominal hysterectomy    . Tonsilectomy, adenoidectomy, bilateral myringotomy and tubes    . Abdominal tumor Left     size of grapefruit       The following portions of the patient's history were reviewed and updated as appropriate: Allergies, current medications, and problem list.    Review of Systems:   Patient denies headache, fevers, malaise, unintentional weight loss, skin rash, eye pain, sinus congestion and sinus pain, sore throat, dysphagia,  hemoptysis , cough, dyspnea, wheezing, chest pain, palpitations, orthopnea, edema, abdominal pain, nausea, melena, diarrhea, constipation, flank pain, dysuria, hematuria,  urinary  Frequency, nocturia, numbness, tingling, seizures,  Focal weakness, Loss of consciousness,  Tremor, insomnia, depression, anxiety, and suicidal ideation.     History   Social History  . Marital Status: Legally Separated    Spouse Name: N/A    Number of Children: N/A  . Years of Education: N/A   Occupational History  . Not on file.   Social History Main Topics  . Smoking status: Never Smoker   . Smokeless tobacco: Never Used  . Alcohol Use: Yes     Comment: rarely  . Drug Use: No  . Sexual Activity: No   Other Topics Concern  . Not on file   Social History Narrative    Objective:  Filed Vitals:   09/18/14 1031  BP: 140/86  Pulse: 82  Temp: 98.2 F (36.8 C)  Resp: 14     General appearance: alert, cooperative and appears stated age Ears: normal TM's and external ear canals both ears Throat: lips, mucosa, and tongue normal; teeth and gums normal Neck: no adenopathy, no carotid bruit, supple, symmetrical, trachea midline and thyroid not enlarged, symmetric, no tenderness/mass/nodules Back: symmetric, no curvature. ROM normal. No CVA tenderness. Lungs: clear to auscultation bilaterally Heart: regular rate and rhythm, S1, S2 normal, no murmur, click, rub or gallop Abdomen: soft, non-tender; bowel sounds normal; no masses,  no organomegaly Pulses: 2+ and symmetric Skin: Skin color, texture, turgor normal. No rashes or lesions Lymph nodes: Cervical, supraclavicular, and  axillary nodes normal.  Assessment and Plan:  Essential hypertension, benign Elevated today,  Patient advised to resume lisinopril at 5 mg daily,  Recurrent knee pain under  diminishing control on current regimen, and your a1c has increased to 7.8   There is  no proteinuria on today's micro urinalysis .  Fasting lipids have been reviewed and statin therapy has not been tolerated in trials past.   Lab Results  Component Value Date   HGBA1C 7.8* 09/16/2014   Lab Results  Component Value  Date   MICROALBUR 1.8 09/16/2014     Type II diabetes mellitus, uncontrolled Loss of contorl due to dietary noncompliance.  Metformin increased to two times daily and low GI diet given. No proteinuria noted.statin intolerant  Foot exam normal.  Eye exam up to date.   Lab Results  Component Value Date   HGBA1C 7.8* 09/16/2014   Lab Results  Component Value Date   MICROALBUR 1.8 09/16/2014     Hyperlipidemia associated with type 2 diabetes mellitus Her untreated LDL 166 /  Trigs 260.  She has had adverse reactions to gemfibrozil and statins in the past   Updated Medication List Outpatient Encounter Prescriptions as of 09/18/2014  Medication Sig  . cyanocobalamin (,VITAMIN B-12,) 1000 MCG/ML injection Inject 1 mL (1,000 mcg total) into the muscle every 30 (thirty) days. I ml IM injection monthly  . glucose blood test strip Use as instructed to check sugars twice daily.  Uncontrolled diabetes mellitus  . metFORMIN (GLUCOPHAGE) 850 MG tablet Take 1 tablet (850 mg total) by mouth 2 (two) times daily with a meal.  . Syringe, Disposable, 1 ML MISC 1 Syringe by Does not apply route once a week. For use with IM b12 injections  . [DISCONTINUED] cyanocobalamin (,VITAMIN B-12,) 1000 MCG/ML injection Inject 1 mL (1,000 mcg total) into the muscle once. I ml IM injection monthly  . [DISCONTINUED] hydrochlorothiazide (HYDRODIURIL) 25 MG tablet Take 1 tablet (25 mg total) by mouth daily.  . [DISCONTINUED] lisinopril (PRINIVIL,ZESTRIL) 10 MG tablet Take 1 tablet (10 mg total) by mouth daily.  . [DISCONTINUED] metFORMIN (GLUCOPHAGE) 850 MG tablet Take 850 mg by mouth 2 (two) times daily with a meal.   . [DISCONTINUED] Syringe, Disposable, 1 ML MISC 1 Syringe by Does not apply route once a week. For b12 injections  . lisinopril (PRINIVIL,ZESTRIL) 5 MG tablet Take 1 tablet (5 mg total) by mouth daily.     Orders Placed This Encounter  Procedures  . HM DIABETES EYE EXAM  . HM DIABETES FOOT EXAM     No Follow-up on file.

## 2014-09-18 NOTE — Progress Notes (Signed)
Pre visit review using our clinic review tool, if applicable. No additional management support is needed unless otherwise documented below in the visit note. 

## 2014-09-18 NOTE — Patient Instructions (Addendum)
Your blood pressure is elevated.  I want you to resume lisinopril at 5 mg daily   I want you to lose 25 lbs over the next six months with a low glycemic index diet and regular exercise (30 minutes of cardio 5 days per week is your goal)  This is  my version of a  "Low GI"  Diet:  It will still lower your blood sugars and allow you to lose 4 to 8  lbs  per month if you follow it carefully.  Your goal with exercise is a minimum of 30 minutes of aerobic exercise 5 days per week (Walking does not count once it becomes easy!)     All of the foods can be found at grocery stores and in bulk at [employer].  The Atkins protein bars and shakes are available in more varieties at Target, [employer] and [place].     7 AM Breakfast:  Choose from the following:  Low carbohydrate Protein  Shakes (I recommend the EAS AdvantEdge "Carb Control" shakes  Or the low carb shakes by Atkins.    2.5 carbs   Arnold's "Sandwhich Thin"toasted  w/ peanut butter (no jelly: about 20 net carbs  "Bagel Thin" with cream cheese and salmon: about 20 carbs   a scrambled egg/bacon/cheese burrito made with Mission's "carb balance" whole wheat tortilla  (about 10 net carbs )  A slice of home made fritatta (egg based dish without a crust:  google it)    Avoid cereal and bananas, oatmeal and cream of wheat and grits. They are loaded with carbohydrates!   10 AM: high protein snack  Protein bar by Atkins (the snack size, under 200 cal, usually < 6 net carbs).    A stick of cheese:  Around 1 carb,  100 cal     Dannon Light n Fit [place] Yogurt  (80 cal, 8 carbs)  Other so called "protein bars" and Greek yogurts tend to be loaded with carbohydrates.  Remember, in food advertising, the word "energy" is synonymous for " carbohydrate."  Lunch:   A Sandwich using the bread choices listed, Can use any  Eggs,  lunchmeat, grilled meat or canned tuna), avocado, regular mayo/mustard  and cheese.  A Salad using blue cheese, ranch,  Goddess or  vinagrette,  No croutons or "confetti" and no "candied nuts" but regular nuts OK.   No pretzels or chips.  Pickles and miniature sweet peppers are a good low carb alternative that provide a "crunch"  The bread is the only source of carbohydrate in a sandwich and  can be decreased by trying some of these alternatives to traditional loaf bread  Joseph's makes a pita bread and a flat bread that are 50 cal and 4 net carbs available at [place] and [place].  This can be toasted to use with hummous as well  Toufayan makes a low carb flatbread that's 100 cal and 9 net carbs available at [employer] and [employer]J's makes 2 sizes of  Low carb whole wheat tortilla  (The large one is 210 cal and 6 net carbs)  Flat Out makes flatbreads that are low carb as well  Avoid "Low fat dressings, as well as Barry Brunner and Wellman dressings They are loaded with sugar!   3 PM/ Mid day  Snack:  Consider  1 ounce of  almonds, walnuts, pistachios, pecans, peanuts,  Macadamia nuts or a nut medley.  Avoid "granola"; the dried cranberries and raisins are loaded  with carbohydrates. Mixed nuts as long as there are no raisins,  cranberries or dried fruit.    Try the prosciutto/mozzarella cheese sticks by Fiorruci  In deli /backery section   High protein   To avoid overindulging in snacks: Try drinking a glass of unsweeted almond/coconut milk  Or a cup of coffee with your Atkins chocolate bar to keep you from having 3!!!   Pork rinds!  Yes Pork Rinds        6 PM  Dinner:     Meat/fowl/fish with a green salad, and either broccoli, cauliflower, green beans, spinach, brussel sprouts or  Lima beans. DO NOT BREAD THE PROTEIN!!      There is a low carb pasta by Dreamfield's that is acceptable and tastes great: only 5 digestible carbs/serving.( All grocery stores but BJs carry it )  Try Hurley Cisco Angelo's chicken piccata or chicken or eggplant parm over low carb pasta.(Lowes and BJs)   Marjory Lies Sanchez's "Carnitas" (pulled pork, no  sauce,  0 carbs) or his beef pot roast to make a dinner burrito (at BJ's)  Pesto over low carb pasta (bj's sells a good quality pesto in the center refrigerated section of the deli   Try satueeing  Cheral Marker with mushroooms  Whole wheat pasta is still full of digestible carbs and  Not as low in glycemic index as Dreamfield's.   Brown rice is still rice,  So skip the rice and noodles if you eat [place] or [place] (or at least limit to 1/2 cup)  9 PM snack :   Breyer's "low carb" fudgsicle or  ice cream bar (Carb Smart line), or  Weight Watcher's ice cream bar , or another "no sugar added" ice cream;  a serving of fresh berries/cherries with whipped cream   Cheese or DANNON'S LlGHT N FIT GREEK YOGURT or the [place]os greek yogurt   8 ounces of [place] unsweetened almond/cococunut milk  Cheese and crackers (using WASA crackers,  They are low carb) or peanut butter on low carb crackers or pita bread     Avoid bananas, pineapple, grapes  and watermelon on a regular basis because they are high in sugar.  THINK OF THEM AS DESSERT  Remember that snack Substitutions should be less than 10 NET carbs per serving and meals should be < 25 net carbs. Remember that carbohydrates from fiber do not affect blood sugar, so you can  subtract fiber grams to get the "net carbs " of any particular food item.

## 2014-09-20 ENCOUNTER — Encounter: Payer: Self-pay | Admitting: Internal Medicine

## 2014-09-20 NOTE — Assessment & Plan Note (Signed)
Her untreated LDL 166 /  Trigs 260.  She has had adverse reactions to gemfibrozil and statins in the past

## 2014-09-20 NOTE — Assessment & Plan Note (Signed)
Loss of contorl due to dietary noncompliance.  Metformin increased to two times daily and low GI diet given. No proteinuria noted.statin intolerant  Foot exam normal.  Eye exam up to date.   Lab Results  Component Value Date   HGBA1C 7.8* 09/16/2014   Lab Results  Component Value Date   MICROALBUR 1.8 09/16/2014

## 2014-09-20 NOTE — Assessment & Plan Note (Signed)
under  diminishing control on current regimen, and your a1c has increased to 7.8   There is  no proteinuria on today's micro urinalysis .  Fasting lipids have been reviewed and statin therapy has not been tolerated in trials past.   Lab Results  Component Value Date   HGBA1C 7.8* 09/16/2014   Lab Results  Component Value Date   MICROALBUR 1.8 09/16/2014

## 2014-09-20 NOTE — Assessment & Plan Note (Signed)
Elevated today,  Patient advised to resume lisinopril at 5 mg daily,

## 2014-12-08 LAB — HM DIABETES EYE EXAM

## 2014-12-16 ENCOUNTER — Telehealth: Payer: Self-pay | Admitting: *Deleted

## 2014-12-16 DIAGNOSIS — IMO0002 Reserved for concepts with insufficient information to code with codable children: Secondary | ICD-10-CM

## 2014-12-16 DIAGNOSIS — E1169 Type 2 diabetes mellitus with other specified complication: Secondary | ICD-10-CM

## 2014-12-16 DIAGNOSIS — E1165 Type 2 diabetes mellitus with hyperglycemia: Secondary | ICD-10-CM

## 2014-12-16 DIAGNOSIS — I10 Essential (primary) hypertension: Secondary | ICD-10-CM

## 2014-12-16 DIAGNOSIS — E785 Hyperlipidemia, unspecified: Secondary | ICD-10-CM

## 2014-12-16 DIAGNOSIS — E538 Deficiency of other specified B group vitamins: Secondary | ICD-10-CM

## 2014-12-16 NOTE — Telephone Encounter (Signed)
What labs and dx?  

## 2014-12-17 ENCOUNTER — Other Ambulatory Visit: Payer: Self-pay | Admitting: Internal Medicine

## 2014-12-17 ENCOUNTER — Other Ambulatory Visit: Payer: Self-pay

## 2014-12-17 ENCOUNTER — Other Ambulatory Visit (INDEPENDENT_AMBULATORY_CARE_PROVIDER_SITE_OTHER): Payer: Commercial Managed Care - PPO

## 2014-12-17 DIAGNOSIS — IMO0002 Reserved for concepts with insufficient information to code with codable children: Secondary | ICD-10-CM

## 2014-12-17 DIAGNOSIS — E1169 Type 2 diabetes mellitus with other specified complication: Secondary | ICD-10-CM | POA: Diagnosis not present

## 2014-12-17 DIAGNOSIS — E119 Type 2 diabetes mellitus without complications: Secondary | ICD-10-CM

## 2014-12-17 DIAGNOSIS — E785 Hyperlipidemia, unspecified: Secondary | ICD-10-CM | POA: Diagnosis not present

## 2014-12-17 DIAGNOSIS — E1165 Type 2 diabetes mellitus with hyperglycemia: Secondary | ICD-10-CM

## 2014-12-17 DIAGNOSIS — E538 Deficiency of other specified B group vitamins: Secondary | ICD-10-CM

## 2014-12-17 NOTE — Addendum Note (Signed)
Addended by: Johnsie Cancel on: 12/17/2014 03:31 PM   Modules accepted: Orders

## 2014-12-17 NOTE — Progress Notes (Unsigned)
Re-entered labs as future.

## 2014-12-18 LAB — COMPREHENSIVE METABOLIC PANEL
ALBUMIN: 4 g/dL (ref 3.5–5.2)
ALT: 15 U/L (ref 0–35)
AST: 13 U/L (ref 0–37)
Alkaline Phosphatase: 54 U/L (ref 39–117)
BILIRUBIN TOTAL: 0.3 mg/dL (ref 0.2–1.2)
BUN: 17 mg/dL (ref 6–23)
CHLORIDE: 104 meq/L (ref 96–112)
CO2: 28 meq/L (ref 19–32)
Calcium: 9.3 mg/dL (ref 8.4–10.5)
Creatinine, Ser: 0.73 mg/dL (ref 0.40–1.20)
GFR: 102.86 mL/min (ref >60.00)
Glucose, Bld: 119 mg/dL — ABNORMAL HIGH (ref 70–99)
POTASSIUM: 4.8 meq/L (ref 3.5–5.1)
SODIUM: 138 meq/L (ref 135–145)
TOTAL PROTEIN: 6.9 g/dL (ref 6.0–8.3)

## 2014-12-18 LAB — LIPID PANEL
CHOL/HDL RATIO: 4
Cholesterol: 238 mg/dL — ABNORMAL HIGH (ref 0–200)
HDL: 53.4 mg/dL (ref >39.00)
LDL Cholesterol: 153 mg/dL — ABNORMAL HIGH (ref 0–99)
NONHDL: 184.6
Triglycerides: 156 mg/dL — ABNORMAL HIGH (ref 0.0–149.0)
VLDL: 31.2 mg/dL (ref 0.0–40.0)

## 2014-12-18 LAB — VITAMIN B12: Vitamin B-12: 409 pg/mL (ref 211–911)

## 2014-12-18 LAB — HEMOGLOBIN A1C: HEMOGLOBIN A1C: 7.2 % — AB (ref 4.6–6.5)

## 2014-12-19 ENCOUNTER — Ambulatory Visit: Payer: Commercial Managed Care - PPO | Admitting: Internal Medicine

## 2014-12-22 ENCOUNTER — Encounter: Payer: Self-pay | Admitting: Internal Medicine

## 2014-12-22 ENCOUNTER — Ambulatory Visit (INDEPENDENT_AMBULATORY_CARE_PROVIDER_SITE_OTHER): Payer: Commercial Managed Care - PPO | Admitting: Internal Medicine

## 2014-12-22 VITALS — BP 134/74 | HR 76 | Temp 97.8°F | Resp 16 | Wt 196.8 lb

## 2014-12-22 DIAGNOSIS — E1169 Type 2 diabetes mellitus with other specified complication: Secondary | ICD-10-CM

## 2014-12-22 DIAGNOSIS — I10 Essential (primary) hypertension: Secondary | ICD-10-CM | POA: Diagnosis not present

## 2014-12-22 DIAGNOSIS — E669 Obesity, unspecified: Secondary | ICD-10-CM

## 2014-12-22 DIAGNOSIS — T148 Other injury of unspecified body region: Secondary | ICD-10-CM | POA: Diagnosis not present

## 2014-12-22 DIAGNOSIS — IMO0002 Reserved for concepts with insufficient information to code with codable children: Secondary | ICD-10-CM

## 2014-12-22 DIAGNOSIS — E1165 Type 2 diabetes mellitus with hyperglycemia: Secondary | ICD-10-CM | POA: Diagnosis not present

## 2014-12-22 DIAGNOSIS — E785 Hyperlipidemia, unspecified: Secondary | ICD-10-CM

## 2014-12-22 DIAGNOSIS — E119 Type 2 diabetes mellitus without complications: Secondary | ICD-10-CM

## 2014-12-22 DIAGNOSIS — E1159 Type 2 diabetes mellitus with other circulatory complications: Secondary | ICD-10-CM

## 2014-12-22 DIAGNOSIS — W57XXXA Bitten or stung by nonvenomous insect and other nonvenomous arthropods, initial encounter: Secondary | ICD-10-CM

## 2014-12-22 LAB — HM DIABETES FOOT EXAM: HM Diabetic Foot Exam: NORMAL

## 2014-12-22 MED ORDER — METFORMIN HCL 850 MG PO TABS: 850.0000 mg | ORAL_TABLET | Freq: Two times a day (BID) | ORAL | Status: DC

## 2014-12-22 NOTE — Progress Notes (Signed)
Patient ID: Jeanette Spencer, female   DOB: Sep 25, 1951, 64 y.o.   MRN: [id number]  Patient Active Problem List   Diagnosis Date Noted  . Bed bug bite 12/23/2014  . Obesity, diabetes, and hypertension syndrome 12/23/2014  . Statin intolerance 05/23/2013  . B12 deficiency 05/23/2013  . Hyperlipidemia associated with type 2 diabetes mellitus 04/13/2013  . Essential hypertension, benign 04/13/2013  . Edema 04/13/2013  . Recurrent knee pain 04/13/2013  . Type II diabetes mellitus, uncontrolled     Subjective:  CC:   Chief Complaint  Patient presents with  . Follow-up    Small red papuleds to bilateral arms and left leg an to area between shoullder upper back nech area.  . Diabetes    Follow up on Labs.  . Back Pain    Stopped lisinopril due to causing back pain?    HPI:   Jeanette Spencer is a 64 y.o. female who presents for  Follow up on type 2 DM with obesity, hypertension and hyperlipidemia.  She has been taking her medications as directed but has stopped taking lisinopril bc she thinks it was causing right sided CVA pain. She is following a low glycemic index diet but not exercising or attempting to lose weight.   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  The rash in is the form of a line of welps along left arm and  on right wrist,   With a few n her ankles,  None have occurred on covered areas.     Past Medical History  Diagnosis Date  . Arthritis   . Chronic bronchitis   . Allergy   . Phlebitis   . Chicken pox   . Diabetes mellitus without complication     Past Surgical History  Procedure Laterality Date  . Abdominal hysterectomy    . Tonsilectomy, adenoidectomy, bilateral myringotomy and tubes    . Abdominal tumor Left     size of grapefruit       The following portions of the patient's history were reviewed and updated as appropriate: Allergies,  current medications, and problem list.    Review of Systems:   Patient denies headache, fevers, malaise, unintentional weight loss, skin rash, eye pain, sinus congestion and sinus pain, sore throat, dysphagia,  hemoptysis , cough, dyspnea, wheezing, chest pain, palpitations, orthopnea, edema, abdominal pain, nausea, melena, diarrhea, constipation, flank pain, dysuria, hematuria, urinary  Frequency, nocturia, numbness, tingling, seizures,  Focal weakness, Loss of consciousness,  Tremor, insomnia, depression, anxiety, and suicidal ideation.     History   Social History  . Marital Status: Legally Separated    Spouse Name: N/A  . Number of Children: N/A  . Years of Education: N/A   Occupational History  . Not on file.   Social History Main Topics  . Smoking status: Never Smoker   . Smokeless tobacco: Never Used  . Alcohol Use: Yes     Comment: rarely  . Drug Use: No  . Sexual Activity: No   Other Topics Concern  . Not on file   Social History Narrative    Objective:  Filed Vitals:   12/22/14 1036  BP: 134/74  Pulse: 76  Temp: 97.8 F (36.6 C)  Resp: 16     General appearance: alert, cooperative and appears stated age Ears: normal TM's and external ear canals both ears Throat:  lips, mucosa, and tongue normal; teeth and gums normal Neck: no adenopathy, no carotid bruit, supple, symmetrical, trachea midline and thyroid not enlarged, symmetric, no tenderness/mass/nodules Back: symmetric, no curvature. ROM normal. No CVA tenderness. Lungs: clear to auscultation bilaterally Heart: regular rate and rhythm, S1, S2 normal, no murmur, click, rub or gallop Abdomen: soft, non-tender; bowel sounds normal; no masses,  no organomegaly Pulses: 2+ and symmetric Skin: Skin color, texture, turgor normal. No rashes or lesions Lymph nodes: Cervical, supraclavicular, and axillary nodes normal.  Assessment and Plan:  Bed bug bite Suggested by exam and history of contact with new  patient's reclining chair  Supportive care. advice given.    Type II diabetes mellitus, uncontrolled Loss of control has been nearly reversed with dietary compliance and increase in metformin. [name] No proteinuria noted.statin intolerant  Foot exam normal.  Eye exam up to date.   Lab Results  Component Value Date   HGBA1C 7.2* 12/17/2014   Lab Results  Component Value Date   MICROALBUR 1.8 09/16/2014        Essential hypertension, benign Well controlled on current regimen despite stopping lisinopril. Renal function stable, no changes today.   Hyperlipidemia associated with type 2 diabetes mellitus Her untreated LDL 166 /  Trigs 260.  She has had adverse reactions to gemfibrozil and statins in the past     Obesity, diabetes, and hypertension syndrome I have addressed  BMI and recommended a low glycemic index diet utilizing smaller more frequent meals to increase metabolism.  I have also recommended that patient start exercising with a goal of 30 minutes of aerobic exercise a minimum of 5 days per week.     Updated Medication List Outpatient Encounter Prescriptions as of 12/22/2014  Medication Sig  . cyanocobalamin (,VITAMIN B-12,) 1000 MCG/ML injection Inject 1 mL (1,000 mcg total) into the muscle every 30 (thirty) days. I ml IM injection monthly  . glucose blood test strip Use as instructed to check sugars twice daily.  Uncontrolled diabetes mellitus  . metFORMIN (GLUCOPHAGE) 850 MG tablet Take 1 tablet (850 mg total) by mouth 2 (two) times daily with a meal.  . Syringe, Disposable, 1 ML MISC 1 Syringe by Does not apply route once a week. For use with IM b12 injections  . [DISCONTINUED] metFORMIN (GLUCOPHAGE) 850 MG tablet Take 1 tablet (850 mg total) by mouth 2 (two) times daily with a meal.  . [DISCONTINUED] lisinopril (PRINIVIL,ZESTRIL) 5 MG tablet Take 1 tablet (5 mg total) by mouth daily. (Patient not taking: Reported on 12/22/2014)     Orders Placed This Encounter   Procedures  . HM DIABETES EYE EXAM  . HM DIABETES FOOT EXAM    Return in about 3 months (around 03/24/2015) for follow up diabetes.

## 2014-12-22 NOTE — Progress Notes (Signed)
Pre-visit discussion using our clinic review tool. No additional management support is needed unless otherwise documented below in the visit note.  

## 2014-12-22 NOTE — Patient Instructions (Addendum)
Your A1c is much better  at 7.2 .  Please return every 3 months until your A1c is < 7.0 and continue your metformin.    continue to check sugars not more than once daily  1) fasting (goal is 80 to 120)  2) 2 hours post prandial (goal is 150 or less after eating)  Keep  Up the good work on the wt loss .  You're 16 lbs away from your goal  You do not need the lisinopril   You are due for your annual mammogram   For your bug bites, you can use   Benadryl at night , Use allegra or claritin  During the day and benadryl cream as needed    Bedbugs Bedbugs are tiny bugs that live in and around beds. During the day, they hide in mattresses and other places near beds. They come out at night and bite people lying in bed. They need blood to live and grow. Bedbugs can be found in beds anywhere. Usually, they are found in places where many people come and go (hotels, shelters, hospitals). It does not matter whether the place is dirty or clean. Getting bitten by bedbugs rarely causes a medical problem. The biggest problem can be getting rid of them. This often takes the work of a Financial risk analyst. CAUSES  Less use of pesticides. Bedbugs were common before the 1950s. Then, strong pesticides such as DDT nearly wiped them out. Today, these pesticides are not used because they harm the environment and can cause health problems.  More travel. Besides mattresses, bedbugs can also live in clothing and luggage. They can come along as people travel from place to place. Bedbugs are more common in certain parts of the world. When people travel to those areas, the bugs can come home with them.  Presence of birds and bats. Bedbugs often infest birds and bats. If you have these animals in or near your home, bedbugs may infest your house, too. SYMPTOMS It does not hurt to be bitten by a bedbug. You will probably not wake up when you are bitten. Bedbugs usually bite areas of the skin that are not covered. Symptoms  may show when you wake up, or they may take a day or more to show up. Symptoms may include:  Small red bumps on the skin. These might be lined up in a row or clustered in a group.  A darker red dot in the middle of red bumps.  Blisters on the skin. There may be swelling and very bad itching. These may be signs of an allergic reaction. This does not happen often. DIAGNOSIS Bedbug bites might look and feel like other types of insect bites. The bugs do not stay on the body like ticks or lice. They bite, drop off, and crawl away to hide. Your caregiver will probably:  Ask about your symptoms.  Ask about your recent activities and travel.  Check your skin for bedbug bites.  Ask you to check at home for signs of bedbugs. You should look for:  Spots or stains on the bed or nearby. This could be from bedbugs that were crushed or from their eggs or waste.  Bedbugs themselves. They are reddish-brown, oval, and flat. They do not fly. They are about the size of an apple seed.  Places to look for bedbugs include:  Beds. Check mattresses, headboards, box springs, and bed frames.  On drapes and curtains near the bed.  Under carpeting in the bedroom.  Behind electrical outlets.  Behind any wallpaper that is peeling.  Inside luggage. TREATMENT Most bedbug bites do not need treatment. They usually go away on their own in a few days. The bites are not dangerous. However, treatment may be needed if you have scratched so much that your skin has become infected. You may also need treatment if you are allergic to bedbug bites. Treatment options include:  A drug that stops swelling and itching (corticosteroid). Usually, a cream is rubbed on the skin. If you have a bad rash, you may be given a corticosteroid pill.  Oral antihistamines. These are pills to help control itching.  Antibiotic medicines. An antibiotic may be prescribed for infected skin. HOME CARE INSTRUCTIONS   Take any medicine  prescribed by your caregiver for your bites. Follow the directions carefully.  Consider wearing pajamas with long sleeves and pant legs.  Your bedroom may need to be treated. A pest control expert should make sure the bedbugs are gone. You may need to throw away mattresses or luggage. Ask the pest control expert what you can do to keep the bedbugs from coming back. Common suggestions include:  Putting a plastic cover over your mattress.  Washing and drying your clothes and bedding in hot water and a hot dryer. The temperature should be hotter than 120 F (48.9 C). Bedbugs are killed by high temperatures.  Vacuuming carefully all around your bed. Vacuum in all cracks and crevices where the bugs might hide. Do this often.  Carefully checking all used furniture, bedding, or clothes that you bring into your house.  Eliminating bird nests and bat roosts.  If you get bedbug bites when traveling, check all your possessions carefully before bringing them into your house. If you find any bugs on clothes or in your luggage, consider throwing those items away. SEEK MEDICAL CARE IF:  You have red bug bites that keep coming back.  You have red bug bites that itch badly.  You have bug bites that cause a skin rash.  You have scratch marks that are red and sore. SEEK IMMEDIATE MEDICAL CARE IF: You have a fever. Document Released: 10/15/2010 Document Revised: 12/05/2011 Document Reviewed: 10/15/2010 [facility] Patient Information 2015 [place], [place]. This information is not intended to replace advice given to you by your health care provider. Make sure you discuss any questions you have with your health care provider.

## 2014-12-23 ENCOUNTER — Encounter: Payer: Self-pay | Admitting: Internal Medicine

## 2014-12-23 DIAGNOSIS — W57XXXA Bitten or stung by nonvenomous insect and other nonvenomous arthropods, initial encounter: Secondary | ICD-10-CM | POA: Insufficient documentation

## 2014-12-23 NOTE — Assessment & Plan Note (Signed)
Suggested by exam and history of contact with new patient's reclining chair  Supportive care. advice given.

## 2014-12-23 NOTE — Assessment & Plan Note (Signed)
Well controlled on current regimen despite stopping lisinopril. Renal function stable, no changes today.

## 2014-12-23 NOTE — Assessment & Plan Note (Signed)
I have addressed  BMI and recommended a low glycemic index diet utilizing smaller more frequent meals to increase metabolism.  I have also recommended that patient start exercising with a goal of 30 minutes of aerobic exercise a minimum of 5 days per week.  

## 2014-12-23 NOTE — Assessment & Plan Note (Signed)
Her untreated LDL 166 /  Trigs 260.  She has had adverse reactions to gemfibrozil and statins in the past

## 2014-12-23 NOTE — Assessment & Plan Note (Signed)
Loss of control has been nearly reversed with dietary compliance and increase in metformin. [name] No proteinuria noted.statin intolerant  Foot exam normal.  Eye exam up to date.   Lab Results  Component Value Date   HGBA1C 7.2* 12/17/2014   Lab Results  Component Value Date   MICROALBUR 1.8 09/16/2014

## 2015-01-06 ENCOUNTER — Emergency Department
Admit: 2015-01-06 | Disposition: A | Discharge status: Home / Self Care | Payer: Self-pay | Admitting: Emergency Medicine

## 2015-03-24 ENCOUNTER — Other Ambulatory Visit: Payer: Commercial Managed Care - PPO

## 2015-03-25 ENCOUNTER — Ambulatory Visit: Payer: Commercial Managed Care - PPO | Admitting: Internal Medicine

## 2015-05-06 ENCOUNTER — Other Ambulatory Visit: Payer: Commercial Managed Care - PPO

## 2015-05-07 ENCOUNTER — Ambulatory Visit: Payer: Commercial Managed Care - PPO | Admitting: Internal Medicine

## 2015-09-07 ENCOUNTER — Telehealth: Payer: Self-pay | Admitting: *Deleted

## 2015-09-07 ENCOUNTER — Other Ambulatory Visit: Payer: Self-pay

## 2015-09-07 MED ORDER — GLUCOSE BLOOD VI STRP: ORAL_STRIP | Status: AC

## 2015-09-07 MED ORDER — CYANOCOBALAMIN 1000 MCG/ML IJ SOLN: 1000.0000 ug | INTRAMUSCULAR | Status: DC

## 2015-09-07 NOTE — Telephone Encounter (Signed)
Completed her request

## 2015-09-07 NOTE — Telephone Encounter (Signed)
Patient was requesting a medication refill for hr Accu check test stripes, and B-12. Please advise

## 2015-10-13 ENCOUNTER — Other Ambulatory Visit: Payer: Self-pay

## 2015-10-13 DIAGNOSIS — E538 Deficiency of other specified B group vitamins: Secondary | ICD-10-CM

## 2015-10-13 DIAGNOSIS — E119 Type 2 diabetes mellitus without complications: Secondary | ICD-10-CM

## 2015-10-13 DIAGNOSIS — E669 Obesity, unspecified: Secondary | ICD-10-CM

## 2015-10-13 DIAGNOSIS — E785 Hyperlipidemia, unspecified: Secondary | ICD-10-CM

## 2015-10-13 DIAGNOSIS — I10 Essential (primary) hypertension: Secondary | ICD-10-CM

## 2015-10-13 DIAGNOSIS — E1169 Type 2 diabetes mellitus with other specified complication: Secondary | ICD-10-CM

## 2015-10-13 DIAGNOSIS — E1100 Type 2 diabetes mellitus with hyperosmolarity without nonketotic hyperglycemic-hyperosmolar coma (NKHHC): Secondary | ICD-10-CM

## 2015-10-13 DIAGNOSIS — E1159 Type 2 diabetes mellitus with other circulatory complications: Secondary | ICD-10-CM

## 2015-10-13 NOTE — Telephone Encounter (Signed)
Please advise, last OV was in March of 2016.

## 2015-10-14 NOTE — Telephone Encounter (Signed)
Refill denied. Needs fasting labs and appt.   Lab Results  Component Value Date   HGBA1C 7.2* 12/17/2014

## 2016-06-27 ENCOUNTER — Encounter: Payer: Self-pay | Admitting: Emergency Medicine

## 2016-06-27 ENCOUNTER — Ambulatory Visit
Admission: EM | Admit: 2016-06-27 | Discharge: 2016-06-27 | Disposition: A | Discharge status: Home / Self Care | Payer: Medicare Other | Attending: Family Medicine | Admitting: Family Medicine

## 2016-06-27 DIAGNOSIS — S86911A Strain of unspecified muscle(s) and tendon(s) at lower leg level, right leg, initial encounter: Secondary | ICD-10-CM | POA: Diagnosis not present

## 2016-06-27 MED ORDER — HYDROCODONE-ACETAMINOPHEN 5-325 MG PO TABS: ORAL_TABLET | ORAL | 0 refills | Status: DC

## 2016-06-27 NOTE — ED Provider Notes (Signed)
[facility]    CSN: [id number] Arrival date & time: 06/27/16  0850     History   Chief Complaint Chief Complaint  Patient presents with  . Ankle Pain    HPI Jeanette Spencer is a 65 y.o. female.   65 yo female with a c/o right ankle and lower leg pain and swelling for the past 3 days. Denies any falls or direct trauma. States she's done some recent walking and may have twisted her foot.    The history is provided by the patient.    Past Medical History:  Diagnosis Date  . Allergy   . Arthritis   . Chicken pox   . Chronic bronchitis (HCC)   . Diabetes mellitus without complication (HCC)   . Phlebitis     Patient Active Problem List   Diagnosis Date Noted  . Bed bug bite 12/23/2014  . Obesity, diabetes, and hypertension syndrome (HCC) 12/23/2014  . Statin intolerance 05/23/2013  . B12 deficiency 05/23/2013  . Hyperlipidemia associated with type 2 diabetes mellitus (HCC) 04/13/2013  . Essential hypertension, benign 04/13/2013  . Edema 04/13/2013  . Recurrent knee pain 04/13/2013  . Type II diabetes mellitus, uncontrolled (HCC)     Past Surgical History:  Procedure Laterality Date  . ABDOMINAL HYSTERECTOMY    . abdominal tumor Left    size of grapefruit  . TONSILECTOMY, ADENOIDECTOMY, BILATERAL MYRINGOTOMY AND TUBES      OB History    No data available       Home Medications    Prior to Admission medications   Medication Sig Start Date End Date Taking? Authorizing Provider  cyanocobalamin (,VITAMIN B-12,) 1000 MCG/ML injection Inject 1 mL (1,000 mcg total) into the muscle every 30 (thirty) days. I ml IM injection monthly 09/07/15   Sherlene Shams, MD  glucose blood test strip Use as instructed to check sugars twice daily.  Uncontrolled diabetes mellitus E11.65 09/07/15   Sherlene Shams, MD  HYDROcodone-acetaminophen (NORCO/VICODIN) 5-325 MG tablet 1-2 tabs po qhs 06/27/16   Payton Mccallum, MD  metFORMIN (GLUCOPHAGE) 850 MG tablet Take 1  tablet (850 mg total) by mouth 2 (two) times daily with a meal. 12/22/14   Sherlene Shams, MD  Syringe, Disposable, 1 ML MISC 1 Syringe by Does not apply route once a week. For use with IM b12 injections 09/18/14   Sherlene Shams, MD    Family History Family History  Problem Relation Age of Onset  . Diabetes Mother   . Cancer Father   . Cancer Paternal Aunt   . Cancer Paternal Uncle   . Diabetes Paternal Grandmother     Social History Social History  Substance Use Topics  . Smoking status: Never Smoker  . Smokeless tobacco: Never Used  . Alcohol use Yes     Comment: rarely     Allergies   Lipitor [atorvastatin]   Review of Systems Review of Systems   Physical Exam Triage Vital Signs ED Triage Vitals  Enc Vitals Group     BP 06/27/16 0905 (!) 152/80     Pulse Rate 06/27/16 0905 79     Resp 06/27/16 0905 16     Temp 06/27/16 0905 97.3 F (36.3 C)     Temp Source 06/27/16 0905 Tympanic     SpO2 06/27/16 0905 99 %     Weight 06/27/16 0904 196 lb (88.9 kg)     Height 06/27/16 0904 5' (1.524 m)     Head  Circumference --      Peak Flow --      Pain Score 06/27/16 0906 7     Pain Loc --      Pain Edu? --      Excl. in [place]? --    No data found.   Updated Vital Signs BP (!) 152/80 (BP Location: Left Arm)   Pulse 79   Temp 97.3 F (36.3 C) (Tympanic)   Resp 16   Ht 5' (1.524 m)   Wt 196 lb (88.9 kg)   SpO2 99%   BMI 38.28 kg/m   Visual Acuity Right Eye Distance:   Left Eye Distance:   Bilateral Distance:    Right Eye Near:   Left Eye Near:    Bilateral Near:     Physical Exam  Constitutional: She appears well-developed and well-nourished. No distress.  Musculoskeletal:       Right ankle: Normal. She exhibits normal range of motion, no swelling, no ecchymosis, no deformity, no laceration and normal pulse. No tenderness. No AITFL tenderness found. Achilles tendon normal.       Right lower leg: She exhibits tenderness (over the lower shin area over the  muscle) and swelling (mild). She exhibits no bony tenderness, no edema, no deformity and no laceration.  Skin: She is not diaphoretic.  Nursing note and vitals reviewed.    UC Treatments / Results  Labs (all labs ordered are listed, but only abnormal results are displayed) Labs Reviewed - No data to display  EKG  EKG Interpretation None       Radiology No results found.  Procedures Procedures (including critical care time)  Medications Ordered in UC Medications - No data to display   Initial Impression / Assessment and Plan / UC Course  I have reviewed the triage vital signs and the nursing notes.  Pertinent labs & imaging results that were available during my care of the patient were reviewed by me and considered in my medical decision making (see chart for details).  Clinical Course      Final Clinical Impressions(s) / UC Diagnoses   Final diagnoses:  Muscle strain of right lower leg, initial encounter    New Prescriptions Discharge Medication List as of 06/27/2016  9:45 AM    START taking these medications   Details  HYDROcodone-acetaminophen (NORCO/VICODIN) 5-325 MG tablet 1-2 tabs po qhs, Print       1. diagnosis reviewed with patient 2. rx as per orders above; reviewed possible side effects, interactions, risks and benefits  3. Recommend supportive treatment with rest, ice, elevation 4. Follow-up prn if symptoms worsen or don't improve   Norval Gable, MD 06/27/16 1343

## 2016-06-27 NOTE — ED Triage Notes (Signed)
Patient c/o right ankle and foot pain that woke her up Saturday morning.

## 2016-06-29 ENCOUNTER — Telehealth: Payer: Self-pay

## 2016-06-29 NOTE — Telephone Encounter (Signed)
Courtesy call back completed today after patient's visit at [facility]. Patient improved and will call back with any questions or concerns.  

## 2016-09-01 DIAGNOSIS — H2513 Age-related nuclear cataract, bilateral: Secondary | ICD-10-CM | POA: Diagnosis not present

## 2017-06-21 NOTE — Telephone Encounter (Signed)
Error

## 2017-08-11 ENCOUNTER — Other Ambulatory Visit: Payer: Self-pay | Admitting: *Deleted

## 2017-08-11 ENCOUNTER — Telehealth: Payer: Self-pay | Admitting: Internal Medicine

## 2017-08-11 DIAGNOSIS — E119 Type 2 diabetes mellitus without complications: Secondary | ICD-10-CM

## 2017-08-11 MED ORDER — METFORMIN HCL 850 MG PO TABS: 850.0000 mg | ORAL_TABLET | Freq: Two times a day (BID) | ORAL | 0 refills | Status: DC

## 2017-08-11 NOTE — Telephone Encounter (Signed)
Copied from [place] [id number]. Topic: Quick Communication - See Telephone Encounter >> Aug 11, 2017  1:40 PM Ebony Hail wrote: CRM for notification. See Telephone encounter for:  pt has OV 12/26 but is in need or rx refills  metFORMIN (GLUCOPHAGE) 850 MG tablet [[id number]]  Order Details  Dose: 850 mg Route: Oral Frequency: 2 times daily with meals     Dispense Quantity: 180 tablet Refills: 2 Fills remaining: --            [place] [address], [place]    08/11/17.

## 2017-08-11 NOTE — Telephone Encounter (Signed)
Pt notified that medication would be refilled. No further needs voiced at this time.

## 2017-09-20 ENCOUNTER — Ambulatory Visit: Payer: Medicare Other | Admitting: Internal Medicine

## 2017-09-27 ENCOUNTER — Encounter: Payer: Self-pay | Admitting: Internal Medicine

## 2017-09-27 ENCOUNTER — Ambulatory Visit (INDEPENDENT_AMBULATORY_CARE_PROVIDER_SITE_OTHER): Payer: Medicare Other | Admitting: Internal Medicine

## 2017-09-27 VITALS — BP 150/78 | HR 85 | Temp 97.7°F | Resp 16 | Ht 59.75 in | Wt 194.0 lb

## 2017-09-27 DIAGNOSIS — E785 Hyperlipidemia, unspecified: Secondary | ICD-10-CM

## 2017-09-27 DIAGNOSIS — E1121 Type 2 diabetes mellitus with diabetic nephropathy: Secondary | ICD-10-CM | POA: Diagnosis not present

## 2017-09-27 DIAGNOSIS — Z1231 Encounter for screening mammogram for malignant neoplasm of breast: Secondary | ICD-10-CM | POA: Diagnosis not present

## 2017-09-27 DIAGNOSIS — Z1239 Encounter for other screening for malignant neoplasm of breast: Secondary | ICD-10-CM

## 2017-09-27 DIAGNOSIS — I10 Essential (primary) hypertension: Secondary | ICD-10-CM

## 2017-09-27 DIAGNOSIS — E1169 Type 2 diabetes mellitus with other specified complication: Secondary | ICD-10-CM

## 2017-09-27 DIAGNOSIS — E669 Obesity, unspecified: Secondary | ICD-10-CM

## 2017-09-27 DIAGNOSIS — E538 Deficiency of other specified B group vitamins: Secondary | ICD-10-CM | POA: Diagnosis not present

## 2017-09-27 DIAGNOSIS — Z23 Encounter for immunization: Secondary | ICD-10-CM

## 2017-09-27 DIAGNOSIS — L659 Nonscarring hair loss, unspecified: Secondary | ICD-10-CM | POA: Diagnosis not present

## 2017-09-27 DIAGNOSIS — E1165 Type 2 diabetes mellitus with hyperglycemia: Secondary | ICD-10-CM

## 2017-09-27 DIAGNOSIS — E119 Type 2 diabetes mellitus without complications: Secondary | ICD-10-CM | POA: Diagnosis not present

## 2017-09-27 DIAGNOSIS — E1159 Type 2 diabetes mellitus with other circulatory complications: Secondary | ICD-10-CM

## 2017-09-27 NOTE — Patient Instructions (Addendum)
Your blood pressure is elevated.  I will recommend a medication once I see your labs   You received the Pneumovax vaccine today,  BECAUSE YOU HAVE DIABETES AND IT IS STANDARD OF CARE     To make a low carb chip :  Take the Joseph's Lavash or Pita bread,  Or the [place] Low carb whole wheat tortilla   Place on metal cookie sheet  Brush with olive oil  Sprinkle garlic powder (NOT garlic salt), grated parmesan cheese, mediterranean seasoning , or all of them?  Bake at 275 for 30 minutes   We have substitutions for your potatoes!!  Try the mashed cauliflower and riced cauliflower dishes instead of rice and mashed potatoes  Mashed turnips are also very low carb!   For desserts :  Try the Dannon Lt n Fit greek yogurt dessert flavors and top with reddi Whip .  8 carbs,  80 calories  Try Oikos Triple Zero [place] Yogurt in the salted caramel, and the coffee flavors  With Whipped Cream for dessert  breyer's low carb ice cream, available in bars (on a stick, better ) or scoopable ice cream  HERE ARE THE LOW CARB  BREAD CHOICES

## 2017-09-27 NOTE — Progress Notes (Signed)
Subjective:  Patient ID: Jeanette Spencer, female    DOB: [birth year]  Age: 67 y.o. MRN: [id number]  CC: The primary encounter diagnosis was Alopecia. Diagnoses of Obesity, diabetes, and hypertension syndrome ([place]), Hyperlipidemia associated with type 2 diabetes mellitus ([place]), Breast cancer screening, Need for 23-polyvalent pneumococcal polysaccharide vaccine, Uncontrolled type 2 diabetes mellitus with hyperglycemia ([place]), Diabetic nephropathy associated with type 2 diabetes mellitus ([place]), Essential hypertension, benign, B12 deficiency, and Alopecia of scalp were also pertinent to this visit.  HPI :    Jeanette Spencer presents for   reestablishment of care,  Last seen March 2016.  Lost to follow up.    Has been preoccupied with sister's major medical problems, including  narcotic addiction, intestinal obstruction, malnutrition, and  Type 2 DM., complicated by medical noncompliance .  Patient feels stressed out.  Stress eats.  Not exercising or checking blood sugars.  Tries to follow a low GI diet but feels restrained by finances.   Obesity  BMI 38  Blood pressure elevated.  Has not taken lisinopril in over 6 month due to lapse in care.   Hair loss front and crown.  Told by 2 dermatologists she had an "autoimmune " cause but not given treatment    mammomgram  Overdue  Due for colonsocpy  last eye Feb 2018 [facility]  Working night shift,  [place] eggs and toast in the am .. Nibbles all night until she goes to sleep.  Tries to use fruit  And microwave popcorn.      Lab Results  Component Value Date   HGBA1C 12.4 (H) 09/27/2017     Outpatient Medications Prior to Visit  Medication Sig Dispense Refill  . glucose blood test strip Use as instructed to check sugars twice daily.  Uncontrolled diabetes mellitus E11.65 100 each 12  . metFORMIN (GLUCOPHAGE) 850 MG tablet Take 1 tablet (850 mg total) 2 (two) times daily with a meal by mouth. 60 tablet 0  . cyanocobalamin (,VITAMIN B-12,)  1000 MCG/ML injection Inject 1 mL (1,000 mcg total) into the muscle every 30 (thirty) days. I ml IM injection monthly (Patient not taking: Reported on 09/27/2017) 10 mL 2  . HYDROcodone-acetaminophen (NORCO/VICODIN) 5-325 MG tablet 1-2 tabs po qhs (Patient not taking: Reported on 09/27/2017) 6 tablet 0  . Syringe, Disposable, 1 ML MISC 1 Syringe by Does not apply route once a week. For use with IM b12 injections (Patient not taking: Reported on 09/27/2017) 25 each 0   No facility-administered medications prior to visit.     Review of Systems;  Patient denies headache, fevers, malaise, unintentional weight loss, skin rash, eye pain, sinus congestion and sinus pain, sore throat, dysphagia,  hemoptysis , cough, dyspnea, wheezing, chest pain, palpitations, orthopnea, edema, abdominal pain, nausea, melena, diarrhea, constipation, flank pain, dysuria, hematuria, urinary  Frequency, nocturia, numbness, tingling, seizures,  Focal weakness, Loss of consciousness,  Tremor, insomnia, depression, anxiety, and suicidal ideation.      Objective:  BP (!) 150/78 (BP Location: Left Arm, Patient Position: Sitting, Cuff Size: Normal)   Pulse 85   Temp 97.7 F (36.5 C) (Oral)   Resp 16   Ht 4' 11.75" (1.518 m)   Wt 194 lb (88 kg)   SpO2 96%   BMI 38.21 kg/m   BP Readings from Last 3 Encounters:  09/27/17 (!) 150/78  06/27/16 (!) 152/80  12/22/14 134/74    Wt Readings from Last 3 Encounters:  09/27/17 194 lb (88 kg)  06/27/16 196  lb (88.9 kg)  12/22/14 196 lb 12 oz (89.2 kg)    General appearance: alert, cooperative and appears stated age Scalp"  Alopecia of crown and occiput  Neck: no adenopathy, no carotid bruit, supple, symmetrical, trachea midline and thyroid not enlarged, symmetric, no tenderness/mass/nodules Back: symmetric, no curvature. ROM normal. No CVA tenderness. Lungs: clear to auscultation bilaterally Heart: regular rate and rhythm, S1, S2 normal, no murmur, click, rub or  gallop Abdomen: soft, non-tender; bowel sounds normal; no masses,  no organomegaly Pulses: 2+ and symmetric Skin: Skin color, texture, turgor normal. No rashes or lesions Lymph nodes: Cervical, supraclavicular, and axillary nodes normal.  Lab Results  Component Value Date   HGBA1C 12.4 (H) 09/27/2017   HGBA1C 7.2 (H) 12/17/2014   HGBA1C 7.8 (H) 09/16/2014    Lab Results  Component Value Date   CREATININE 0.87 09/27/2017   CREATININE 0.73 12/17/2014   CREATININE 0.8 09/16/2014    Lab Results  Component Value Date   WBC 8.9 04/11/2013   HGB 12.5 04/11/2013   HCT 37.9 04/11/2013   PLT 268.0 04/11/2013   GLUCOSE 275 (H) 09/27/2017   CHOL 254 (H) 09/27/2017   TRIG (H) 09/27/2017    421.0 Triglyceride is over 400; calculations on Lipids are invalid.   HDL 47.20 09/27/2017   LDLDIRECT 172.0 09/27/2017   LDLCALC 153 (H) 12/17/2014   ALT 15 09/27/2017   AST 13 09/27/2017   NA 137 09/27/2017   K 3.9 09/27/2017   CL 100 09/27/2017   CREATININE 0.87 09/27/2017   BUN 20 09/27/2017   CO2 28 09/27/2017   TSH 1.37 09/27/2017   HGBA1C 12.4 (H) 09/27/2017   MICROALBUR 2.3 (H) 09/27/2017    No results found.  Assessment & Plan:   Problem List Items Addressed This Visit    Hyperlipidemia associated with type 2 diabetes mellitus ([place])   Relevant Medications   losartan (COZAAR) 50 MG tablet   Other Relevant Orders   LDL cholesterol, direct (Completed)   Obesity, diabetes, and hypertension syndrome (HCC)   Relevant Medications   losartan (COZAAR) 50 MG tablet   Other Relevant Orders   Hemoglobin A1c (Completed)   Comprehensive metabolic panel (Completed)   Lipid panel (Completed)   Microalbumin / creatinine urine ratio (Completed)   Alopecia of scalp    Thyroid function normal.   Trial of biotin supplements.      B12 deficiency    Injections started. Treated in the past with  Monthly  IM injections.  Repeat level in one week.  Lab Results  Component Value Date    [id number] 902 12/17/2014         Relevant Orders   B12   Breast cancer screening    Mammogram ordered       Relevant Orders   MM DIGITAL SCREENING BILATERAL   Diabetic nephropathy associated with type 2 diabetes mellitus ([place])    Starting losartan  Lab Results  Component Value Date   MICROALBUR 2.3 (H) 09/27/2017   Lab Results  Component Value Date   CREATININE 0.87 09/27/2017         Relevant Medications   losartan (COZAAR) 50 MG tablet   Essential hypertension, benign    Previously on low dose lisinopril.  Starting losartan rtc one week for BMET      Relevant Medications   losartan (COZAAR) 50 MG tablet   Other Relevant Orders   Basic metabolic panel   Type II diabetes mellitus, uncontrolled ([place])    Secondary  to loss to follow up of over 2 years.  Resume metformin,  Add lantus 15 units .  Losartan for htn. And nephropathy  rtc one moth.  Lab Results  Component Value Date   HGBA1C 12.4 (H) 09/27/2017   Lab Results  Component Value Date   CREATININE 0.87 09/27/2017   Lab Results  Component Value Date   MICROALBUR 2.3 (H) 09/27/2017         Relevant Medications   losartan (COZAAR) 50 MG tablet    Other Visit Diagnoses    Alopecia    -  Primary   Relevant Orders   TSH (Completed)   T4, free (Completed)   Need for 23-polyvalent pneumococcal polysaccharide vaccine       Relevant Orders   Pneumococcal polysaccharide vaccine 23-valent greater than or equal to 2yo subcutaneous/IM (Completed)    A total of 40 minutes was spent with patient more than half of which was spent in counseling patient on the above mentioned issues , reviewing and explaining recent labs and imaging studies done, and coordination of care.  I have discontinued Antonette A. Stipe's Syringe (Disposable), cyanocobalamin, and HYDROcodone-acetaminophen. I am also having her start on losartan. Additionally, I am having her maintain her glucose blood.  Meds ordered this encounter   Medications  . losartan (COZAAR) 50 MG tablet    Sig: Take 1 tablet (50 mg total) by mouth daily.    Dispense:  90 tablet    Refill:  3    Medications Discontinued During This Encounter  Medication Reason  . cyanocobalamin (,VITAMIN B-12,) 1000 MCG/ML injection Patient has not taken in last 30 days  . HYDROcodone-acetaminophen (NORCO/VICODIN) 5-325 MG tablet Patient has not taken in last 30 days  . Syringe, Disposable, 1 ML MISC Patient has not taken in last 30 days    Follow-up: No Follow-up on file.   [name], MD

## 2017-09-28 ENCOUNTER — Encounter: Payer: Self-pay | Admitting: Internal Medicine

## 2017-09-28 ENCOUNTER — Other Ambulatory Visit: Payer: Self-pay | Admitting: Internal Medicine

## 2017-09-28 DIAGNOSIS — L659 Nonscarring hair loss, unspecified: Secondary | ICD-10-CM | POA: Insufficient documentation

## 2017-09-28 DIAGNOSIS — Z1239 Encounter for other screening for malignant neoplasm of breast: Secondary | ICD-10-CM | POA: Insufficient documentation

## 2017-09-28 DIAGNOSIS — E1121 Type 2 diabetes mellitus with diabetic nephropathy: Secondary | ICD-10-CM | POA: Insufficient documentation

## 2017-09-28 DIAGNOSIS — E119 Type 2 diabetes mellitus without complications: Secondary | ICD-10-CM

## 2017-09-28 LAB — MICROALBUMIN / CREATININE URINE RATIO
CREATININE, U: 44.2 mg/dL
MICROALB/CREAT RATIO: 5.2 mg/g (ref 0.0–30.0)
Microalb, Ur: 2.3 mg/dL — ABNORMAL HIGH (ref 0.0–1.9)

## 2017-09-28 LAB — COMPREHENSIVE METABOLIC PANEL
ALK PHOS: 68 U/L (ref 39–117)
ALT: 15 U/L (ref 0–35)
AST: 13 U/L (ref 0–37)
Albumin: 4.2 g/dL (ref 3.5–5.2)
BILIRUBIN TOTAL: 0.3 mg/dL (ref 0.2–1.2)
BUN: 20 mg/dL (ref 6–23)
CO2: 28 mEq/L (ref 19–32)
CREATININE: 0.87 mg/dL (ref 0.40–1.20)
Calcium: 9.9 mg/dL (ref 8.4–10.5)
Chloride: 100 mEq/L (ref 96–112)
GFR: 83.3 mL/min (ref >60.00)
GLUCOSE: 275 mg/dL — AB (ref 70–99)
Potassium: 3.9 mEq/L (ref 3.5–5.1)
SODIUM: 137 meq/L (ref 135–145)
TOTAL PROTEIN: 7.6 g/dL (ref 6.0–8.3)

## 2017-09-28 LAB — LDL CHOLESTEROL, DIRECT: Direct LDL: 172 mg/dL

## 2017-09-28 LAB — TSH: TSH: 1.37 u[IU]/mL (ref 0.35–4.50)

## 2017-09-28 LAB — T4, FREE: Free T4: 0.79 ng/dL (ref 0.60–1.60)

## 2017-09-28 LAB — HEMOGLOBIN A1C: Hgb A1c MFr Bld: 12.4 % — ABNORMAL HIGH (ref 4.6–6.5)

## 2017-09-28 LAB — LIPID PANEL
CHOL/HDL RATIO: 5
Cholesterol: 254 mg/dL — ABNORMAL HIGH (ref 0–200)
HDL: 47.2 mg/dL (ref >39.00)
Triglycerides: 421 mg/dL — ABNORMAL HIGH (ref 0.0–149.0)

## 2017-09-28 MED ORDER — INSULIN GLARGINE 100 UNIT/ML ~~LOC~~ SOLN: 15.0000 [IU] | Freq: Every day | SUBCUTANEOUS | 11 refills | Status: DC

## 2017-09-28 MED ORDER — LOSARTAN POTASSIUM 50 MG PO TABS: 50.0000 mg | ORAL_TABLET | Freq: Every day | ORAL | 3 refills | Status: DC

## 2017-09-28 MED ORDER — METFORMIN HCL 850 MG PO TABS: 850.0000 mg | ORAL_TABLET | Freq: Two times a day (BID) | ORAL | 2 refills | Status: DC

## 2017-09-28 MED ORDER — "INSULIN SYRINGE/NEEDLE 28G X 1/2"" 1 ML MISC": 1.0000 | Freq: Every day | 0 refills | Status: DC

## 2017-09-28 NOTE — Assessment & Plan Note (Signed)
Previously on low dose lisinopril.  Starting losartan rtc one week for BMET

## 2017-09-28 NOTE — Assessment & Plan Note (Signed)
Starting losartan  Lab Results  Component Value Date   MICROALBUR 2.3 (H) 09/27/2017   Lab Results  Component Value Date   CREATININE 0.87 09/27/2017

## 2017-09-28 NOTE — Assessment & Plan Note (Signed)
Thyroid function normal.   Trial of biotin supplements.

## 2017-09-28 NOTE — Assessment & Plan Note (Signed)
Injections started. Treated in the past with  Monthly  IM injections.  Repeat level in one week.  Lab Results  Component Value Date   [id number] 254 12/17/2014

## 2017-09-28 NOTE — Assessment & Plan Note (Signed)
Secondary to loss to follow up of over 2 years.  Resume metformin,  Add lantus 15 units .  Losartan for htn. And nephropathy  rtc one moth.  Lab Results  Component Value Date   HGBA1C 12.4 (H) 09/27/2017   Lab Results  Component Value Date   CREATININE 0.87 09/27/2017   Lab Results  Component Value Date   MICROALBUR 2.3 (H) 09/27/2017

## 2017-09-28 NOTE — Assessment & Plan Note (Signed)
Mammogram ordered

## 2017-10-03 ENCOUNTER — Other Ambulatory Visit: Payer: Self-pay | Admitting: Internal Medicine

## 2017-10-03 MED ORDER — GLIPIZIDE 5 MG PO TABS: 5.0000 mg | ORAL_TABLET | Freq: Two times a day (BID) | ORAL | 3 refills | Status: DC

## 2017-10-24 ENCOUNTER — Telehealth: Payer: Self-pay | Admitting: Internal Medicine

## 2017-10-24 NOTE — Telephone Encounter (Signed)
Pt was at the office and wanted to let Dr. Derrel Nip know that she got sick and is still sick after Tullo gave her the pneumococcal polysaccharide injection. Pt states she is starting to feel better.

## 2017-10-25 NOTE — Telephone Encounter (Signed)
FYI

## 2017-11-26 LAB — HM DIABETES EYE EXAM

## 2017-12-27 ENCOUNTER — Ambulatory Visit (INDEPENDENT_AMBULATORY_CARE_PROVIDER_SITE_OTHER): Payer: Medicare Other | Admitting: Internal Medicine

## 2017-12-27 ENCOUNTER — Encounter: Payer: Self-pay | Admitting: Internal Medicine

## 2017-12-27 VITALS — BP 158/82 | HR 91 | Temp 98.8°F | Resp 15 | Ht 59.75 in | Wt 198.4 lb

## 2017-12-27 DIAGNOSIS — E119 Type 2 diabetes mellitus without complications: Secondary | ICD-10-CM

## 2017-12-27 DIAGNOSIS — I10 Essential (primary) hypertension: Secondary | ICD-10-CM

## 2017-12-27 DIAGNOSIS — E1121 Type 2 diabetes mellitus with diabetic nephropathy: Secondary | ICD-10-CM | POA: Diagnosis not present

## 2017-12-27 DIAGNOSIS — E1169 Type 2 diabetes mellitus with other specified complication: Secondary | ICD-10-CM

## 2017-12-27 DIAGNOSIS — E1165 Type 2 diabetes mellitus with hyperglycemia: Secondary | ICD-10-CM | POA: Diagnosis not present

## 2017-12-27 DIAGNOSIS — E1159 Type 2 diabetes mellitus with other circulatory complications: Secondary | ICD-10-CM

## 2017-12-27 DIAGNOSIS — E669 Obesity, unspecified: Secondary | ICD-10-CM | POA: Diagnosis not present

## 2017-12-27 MED ORDER — LOSARTAN POTASSIUM-HCTZ 50-12.5 MG PO TABS: 1.0000 | ORAL_TABLET | Freq: Every day | ORAL | 3 refills | Status: DC

## 2017-12-27 NOTE — Patient Instructions (Signed)
Your blood sugars look much better ,  So I am not changing your dose of metformin and glipizide unless your A1c is > 7.0    However, your blood pressure is still too high.  Our goal is 130/70 or less.   I am changing  your medication to losartan/hct   This has a mild diuretic (fluid pill) to help get rid of excess fluid and lower your blood pressure.   Return for fasting labs next week at your convenience

## 2017-12-27 NOTE — Progress Notes (Signed)
Subjective:  Patient ID: Jeanette Spencer, female    DOB: 08/16/1951  Age: 67 y.o. MRN: [id number]  CC: The primary encounter diagnosis was Uncontrolled type 2 diabetes mellitus with hyperglycemia ([place]). Diagnoses of Diabetic nephropathy associated with type 2 diabetes mellitus ([place]), Essential hypertension, benign, and Obesity, diabetes, and hypertension syndrome ([place]) were also pertinent to this visit.  HPI Jeanette Spencer presents for 3 month follow up on controlled diabetes.  Last a1c was 12. 4 Patient has no complaints today.  Patient is following a low glycemic index diet and taking all prescribed medications regularly without side effects.  Fasting sugars have been under less than 140 most of the time and post prandials have been under 160 except on rare occasions. Patient is exercising about 3 times per week and intentionally trying to lose weight .  Patient has had an eye exam in the last 12 months and checks feet regularly for signs of infection.  Patient does not walk barefoot outside,  And denies an numbness tingling or burning in feet. Patient is up to date on all recommended vaccinations  Patient is taking her medications as prescribed and notes no adverse effects.  Home BP readings have NOT BEEN DONE  .  She is avoiding added salt in her diet and is drinking Bragg's vinegar to lower her blood pressure,  does not want to increase losartan because  she has recurrent epsidoes of "seimmy headedness" but has'mt checker her blood pressure .  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  Has been having recurrent intermittent  pain in posterior shoulder blade on the left.  Aggravated by deep breathing ,  Occurred at rest , lasted 5-10 minutes.  Not brought on by eating.     Outpatient Medications Prior to Visit  Medication Sig Dispense Refill  . glipiZIDE (GLUCOTROL) 5 MG tablet  Take 1 tablet (5 mg total) by mouth 2 (two) times daily before a meal. 60 tablet 3  . glucose blood test strip Use as instructed to check sugars twice daily.  Uncontrolled diabetes mellitus E11.65 100 each 12  . metFORMIN (GLUCOPHAGE) 850 MG tablet Take 1 tablet (850 mg total) by mouth 2 (two) times daily with a meal. 180 tablet 2  . losartan (COZAAR) 50 MG tablet Take 1 tablet (50 mg total) by mouth daily. 90 tablet 3  . INS SYRINGE/NEEDLE 1CC/28G (B-D INSULIN SYRINGE 1CC/28G) 28G X 1/2" 1 ML MISC 1 Syringe by Does not apply route daily after supper. (Patient not taking: Reported on 12/27/2017) 100 each 0   No facility-administered medications prior to visit.     Review of Systems;  Patient denies headache, fevers, malaise, unintentional weight loss, skin rash, eye pain, sinus congestion and sinus pain, sore throat, dysphagia,  hemoptysis , cough, dyspnea, wheezing, chest pain, palpitations, orthopnea, edema, abdominal pain, nausea, melena, diarrhea, constipation, flank pain, dysuria, hematuria, urinary  Frequency, nocturia, numbness, tingling, seizures,  Focal weakness, Loss of consciousness,  Tremor, insomnia, depression, anxiety, and suicidal ideation.      Objective:  BP (!) 158/82 (BP Location: Left Arm, Patient Position: Sitting, Cuff Size: Large)   Pulse 91   Temp 98.8 F (37.1 C) (Oral)   Resp 15   Ht 4' 11.75" (1.518 m)   Wt 198 lb 6.4 oz (90 kg)   SpO2 96%  BMI 39.07 kg/m   BP Readings from Last 3 Encounters:  12/27/17 (!) 158/82  09/27/17 (!) 150/78  06/27/16 (!) 152/80    Wt Readings from Last 3 Encounters:  12/27/17 198 lb 6.4 oz (90 kg)  09/27/17 194 lb (88 kg)  06/27/16 196 lb (88.9 kg)    General appearance: alert, cooperative and appears stated age Ears: normal TM's and external ear canals both ears Throat: lips, mucosa, and tongue normal; teeth and gums normal Neck: no adenopathy, no carotid bruit, supple, symmetrical, trachea midline and thyroid not  enlarged, symmetric, no tenderness/mass/nodules Back: symmetric, no curvature. ROM normal. No CVA tenderness. Lungs: clear to auscultation bilaterally Heart: regular rate and rhythm, S1, S2 normal, no murmur, click, rub or gallop Abdomen: soft, non-tender; bowel sounds normal; no masses,  no organomegaly Pulses: 2+ and symmetric Skin: Skin color, texture, turgor normal. No rashes or lesions Lymph nodes: Cervical, supraclavicular, and axillary nodes normal.  Lab Results  Component Value Date   HGBA1C 7.9 12/29/2017   HGBA1C 12.4 (H) 09/27/2017   HGBA1C 7.2 (H) 12/17/2014    Lab Results  Component Value Date   CREATININE 0.87 09/27/2017   CREATININE 0.73 12/17/2014   CREATININE 0.8 09/16/2014    Lab Results  Component Value Date   WBC 8.9 04/11/2013   HGB 12.5 04/11/2013   HCT 37.9 04/11/2013   PLT 268.0 04/11/2013   GLUCOSE 275 (H) 09/27/2017   CHOL 254 (H) 09/27/2017   TRIG (H) 09/27/2017    421.0 Triglyceride is over 400; calculations on Lipids are invalid.   HDL 47.20 09/27/2017   LDLDIRECT 172.0 09/27/2017   LDLCALC 153 (H) 12/17/2014   ALT 15 09/27/2017   AST 13 09/27/2017   NA 137 09/27/2017   K 3.9 09/27/2017   CL 100 09/27/2017   CREATININE 0.87 09/27/2017   BUN 20 09/27/2017   CO2 28 09/27/2017   TSH 1.37 09/27/2017   HGBA1C 7.9 12/29/2017   MICROALBUR 2.3 (H) 09/27/2017    No results found.  Assessment & Plan:   Problem List Items Addressed This Visit    Type II diabetes mellitus, uncontrolled ([place]) - Primary    Blood sugars have improved by review of current log. A1c has improved but is not at goal. Will recommend adding [place] or  jardiance (pending formulary coverage) for cardioprotection.  continue ARB,  Advised to consider adding asa for primary protection    Lab Results  Component Value Date   HGBA1C 7.9 12/29/2017   Lab Results  Component Value Date   MICROALBUR 2.3 (H) 09/27/2017          Relevant Medications    losartan-hydrochlorothiazide (HYZAAR) 50-12.5 MG tablet   empagliflozin (JARDIANCE) 25 MG TABS tablet   Other Relevant Orders   POCT HgB A1C (Completed)   Lipid panel   Comprehensive metabolic panel   Obesity, diabetes, and hypertension syndrome ([place])    I have addressed  BMI and recommended wt loss of 10% of body weigh over the next 6 months using a low glycemic index diet and regular exercise a minimum of 5 days per week.   bp addressed separately       Relevant Medications   losartan-hydrochlorothiazide (HYZAAR) 50-12.5 MG tablet   empagliflozin (JARDIANCE) 25 MG TABS tablet   Essential hypertension, benign    Not well controlled on losartan 50 mg . Adding hctz.  Return for assessment of Renal function stable, one week es today.      Relevant Medications  losartan-hydrochlorothiazide (HYZAAR) 50-12.5 MG tablet   Diabetic nephropathy associated with type 2 diabetes mellitus ([facility])    Minimal proteinuria  Continue losartan  Lab Results  Component Value Date   MICROALBUR 2.3 (H) 09/27/2017   Lab Results  Component Value Date   CREATININE 0.87 09/27/2017         Relevant Medications   losartan-hydrochlorothiazide (HYZAAR) 50-12.5 MG tablet   empagliflozin (JARDIANCE) 25 MG TABS tablet      I have discontinued Janiqua A. Rettke's INS SYRINGE/NEEDLE 1CC/28G and losartan. I am also having her start on losartan-hydrochlorothiazide and empagliflozin. Additionally, I am having her maintain her glucose blood, metFORMIN, and glipiZIDE.  Meds ordered this encounter  Medications  . losartan-hydrochlorothiazide (HYZAAR) 50-12.5 MG tablet    Sig: Take 1 tablet by mouth daily.    Dispense:  90 tablet    Refill:  3  . empagliflozin (JARDIANCE) 25 MG TABS tablet    Sig: Take 25 mg by mouth daily.    Dispense:  30 tablet    Refill:  1    Medications Discontinued During This Encounter  Medication Reason  . INS SYRINGE/NEEDLE 1CC/28G (B-D INSULIN SYRINGE 1CC/28G) 28G X 1/2" 1  ML MISC Prescription never filled  . losartan (COZAAR) 50 MG tablet     Follow-up: Return in about 3 months (around 03/28/2018) for follow up diabetes.   [name], MD

## 2017-12-29 LAB — POCT GLYCOSYLATED HEMOGLOBIN (HGB A1C): Hemoglobin A1C: 7.9

## 2017-12-30 MED ORDER — EMPAGLIFLOZIN 25 MG PO TABS: 25.0000 mg | ORAL_TABLET | Freq: Every day | ORAL | 1 refills | Status: DC

## 2017-12-30 NOTE — Assessment & Plan Note (Signed)
Minimal proteinuria  Continue losartan  Lab Results  Component Value Date   MICROALBUR 2.3 (H) 09/27/2017   Lab Results  Component Value Date   CREATININE 0.87 09/27/2017

## 2017-12-30 NOTE — Assessment & Plan Note (Addendum)
I have addressed  BMI and recommended wt loss of 10% of body weigh over the next 6 months using a low glycemic index diet and regular exercise a minimum of 5 days per week.   bp addressed separately

## 2017-12-30 NOTE — Assessment & Plan Note (Signed)
Blood sugars have improved by review of current log. A1c has improved but is not at goal. Will recommend adding [place] or  jardiance (pending formulary coverage) for cardioprotection.  continue ARB,  Advised to consider adding asa for primary protection    Lab Results  Component Value Date   HGBA1C 7.9 12/29/2017   Lab Results  Component Value Date   MICROALBUR 2.3 (H) 09/27/2017

## 2017-12-30 NOTE — Assessment & Plan Note (Addendum)
Not well controlled on losartan 50 mg . Adding hctz.  Return for assessment of Renal function stable, one week es today.

## 2018-01-04 ENCOUNTER — Other Ambulatory Visit (INDEPENDENT_AMBULATORY_CARE_PROVIDER_SITE_OTHER): Payer: Medicare Other

## 2018-01-04 DIAGNOSIS — E1165 Type 2 diabetes mellitus with hyperglycemia: Secondary | ICD-10-CM

## 2018-01-04 LAB — COMPREHENSIVE METABOLIC PANEL
ALK PHOS: 52 U/L (ref 39–117)
ALT: 11 U/L (ref 0–35)
AST: 10 U/L (ref 0–37)
Albumin: 4.1 g/dL (ref 3.5–5.2)
BILIRUBIN TOTAL: 0.3 mg/dL (ref 0.2–1.2)
BUN: 21 mg/dL (ref 6–23)
CALCIUM: 9.3 mg/dL (ref 8.4–10.5)
CO2: 27 mEq/L (ref 19–32)
CREATININE: 0.77 mg/dL (ref 0.40–1.20)
Chloride: 106 mEq/L (ref 96–112)
GFR: 95.83 mL/min (ref >60.00)
Glucose, Bld: 140 mg/dL — ABNORMAL HIGH (ref 70–99)
Potassium: 4.4 mEq/L (ref 3.5–5.1)
Sodium: 139 mEq/L (ref 135–145)
TOTAL PROTEIN: 7.6 g/dL (ref 6.0–8.3)

## 2018-01-04 LAB — LIPID PANEL
CHOL/HDL RATIO: 4
CHOLESTEROL: 207 mg/dL — AB (ref 0–200)
HDL: 50.3 mg/dL (ref >39.00)
LDL Cholesterol: 119 mg/dL — ABNORMAL HIGH (ref 0–99)
NonHDL: 156.2
TRIGLYCERIDES: 184 mg/dL — AB (ref 0.0–149.0)
VLDL: 36.8 mg/dL (ref 0.0–40.0)

## 2018-03-28 ENCOUNTER — Encounter: Payer: Self-pay | Admitting: Internal Medicine

## 2018-03-28 ENCOUNTER — Ambulatory Visit (INDEPENDENT_AMBULATORY_CARE_PROVIDER_SITE_OTHER): Payer: Medicare Other | Admitting: Internal Medicine

## 2018-03-28 VITALS — BP 122/70 | HR 84 | Temp 98.0°F | Resp 15 | Ht 59.75 in | Wt 189.4 lb

## 2018-03-28 DIAGNOSIS — Z1231 Encounter for screening mammogram for malignant neoplasm of breast: Secondary | ICD-10-CM

## 2018-03-28 DIAGNOSIS — I10 Essential (primary) hypertension: Secondary | ICD-10-CM

## 2018-03-28 DIAGNOSIS — E1169 Type 2 diabetes mellitus with other specified complication: Secondary | ICD-10-CM

## 2018-03-28 DIAGNOSIS — E785 Hyperlipidemia, unspecified: Secondary | ICD-10-CM | POA: Diagnosis not present

## 2018-03-28 DIAGNOSIS — Z789 Other specified health status: Secondary | ICD-10-CM

## 2018-03-28 DIAGNOSIS — E1159 Type 2 diabetes mellitus with other circulatory complications: Secondary | ICD-10-CM

## 2018-03-28 DIAGNOSIS — Z1239 Encounter for other screening for malignant neoplasm of breast: Secondary | ICD-10-CM

## 2018-03-28 DIAGNOSIS — E119 Type 2 diabetes mellitus without complications: Secondary | ICD-10-CM

## 2018-03-28 DIAGNOSIS — E669 Obesity, unspecified: Secondary | ICD-10-CM | POA: Diagnosis not present

## 2018-03-28 DIAGNOSIS — E1165 Type 2 diabetes mellitus with hyperglycemia: Secondary | ICD-10-CM

## 2018-03-28 LAB — COMPREHENSIVE METABOLIC PANEL
ALT: 12 U/L (ref 0–35)
AST: 14 U/L (ref 0–37)
Albumin: 4.2 g/dL (ref 3.5–5.2)
Alkaline Phosphatase: 47 U/L (ref 39–117)
BILIRUBIN TOTAL: 0.3 mg/dL (ref 0.2–1.2)
BUN: 24 mg/dL — ABNORMAL HIGH (ref 6–23)
CO2: 26 meq/L (ref 19–32)
CREATININE: 0.9 mg/dL (ref 0.40–1.20)
Calcium: 9.6 mg/dL (ref 8.4–10.5)
Chloride: 103 mEq/L (ref 96–112)
GFR: 79.98 mL/min (ref >60.00)
GLUCOSE: 148 mg/dL — AB (ref 70–99)
Potassium: 4.3 mEq/L (ref 3.5–5.1)
SODIUM: 137 meq/L (ref 135–145)
Total Protein: 7.3 g/dL (ref 6.0–8.3)

## 2018-03-28 LAB — LIPID PANEL
CHOL/HDL RATIO: 5
Cholesterol: 235 mg/dL — ABNORMAL HIGH (ref 0–200)
HDL: 49.5 mg/dL (ref >39.00)
LDL Cholesterol: 159 mg/dL — ABNORMAL HIGH (ref 0–99)
NONHDL: 185.87
Triglycerides: 136 mg/dL (ref 0.0–149.0)
VLDL: 27.2 mg/dL (ref 0.0–40.0)

## 2018-03-28 MED ORDER — GLIPIZIDE 5 MG PO TABS: 5.0000 mg | ORAL_TABLET | Freq: Two times a day (BID) | ORAL | 3 refills | Status: DC

## 2018-03-28 NOTE — Progress Notes (Signed)
Subjective:  Patient ID: Jeanette Spencer, female    DOB: Dec 19, 1950  Age: 67 y.o. MRN: [id number]  CC: The primary encounter diagnosis was Hyperlipidemia associated with type 2 diabetes mellitus ([place]). Diagnoses of Obesity, diabetes, and hypertension syndrome ([place]), Screening for breast cancer, Statin intolerance, Essential hypertension, benign, Uncontrolled type 2 diabetes mellitus with hyperglycemia ([place]), and Morbid obesity ([place]) were also pertinent to this visit.  HPI Jeanette Spencer presents for  3 month follow up on uncontrolled diabetes.  Patient has no complaints today.  Patient is following a low glycemic index diet and NEVER STARTED THE JARDIANCE DUE TO FEAR OF SIDE EFFECTS .  She rean out of glipzide about 2 weeks ago and did not request a refill because she was unclear about whether she was supposed to continue the medication.  She did not call to inquire .  Pateint recalls fro memory that her fasting sugars have been under less than 120 TO 130 most of the time and post prandials have been under 160 except on rare occasions. Patient  Reports that she has  had 2 episodes of symptomatic low blood sugar of 69 and 89.  The episodes have occurred in the late afternoon.  She is not exercising   or intentionally trying to lose weight .  Patient has had an eye exam in March  and checks feet regularly for signs of infection.  Patient does not walk barefoot outside,  And denies any  numbness tingling or burning in feet. Patient is up to date on all recommended vaccinations  Lab Results  Component Value Date   MICROALBUR 2.3 (H) 09/27/2017     Has been out of glipizide for two weeks  Foot exam   Lab Results  Component Value Date   HGBA1C 7.9 12/29/2017    Outpatient Medications Prior to Visit  Medication Sig Dispense Refill  . glucose blood test strip Use as instructed to check sugars twice daily.  Uncontrolled diabetes mellitus E11.65 100 each 12  . losartan-hydrochlorothiazide  (HYZAAR) 50-12.5 MG tablet Take 1 tablet by mouth daily. 90 tablet 3  . metFORMIN (GLUCOPHAGE) 850 MG tablet Take 1 tablet (850 mg total) by mouth 2 (two) times daily with a meal. 180 tablet 2  . glipiZIDE (GLUCOTROL) 5 MG tablet Take 1 tablet (5 mg total) by mouth 2 (two) times daily before a meal. 60 tablet 3  . empagliflozin (JARDIANCE) 25 MG TABS tablet Take 25 mg by mouth daily. (Patient not taking: Reported on 03/28/2018) 30 tablet 1   No facility-administered medications prior to visit.     Review of Systems;  Patient denies headache, fevers, malaise, unintentional weight loss, skin rash, eye pain, sinus congestion and sinus pain, sore throat, dysphagia,  hemoptysis , cough, dyspnea, wheezing, chest pain, palpitations, orthopnea, edema, abdominal pain, nausea, melena, diarrhea, constipation, flank pain, dysuria, hematuria, urinary  Frequency, nocturia, numbness, tingling, seizures,  Focal weakness, Loss of consciousness,  Tremor, insomnia, depression, anxiety, and suicidal ideation.      Objective:  BP 122/70 (BP Location: Left Arm, Patient Position: Sitting, Cuff Size: Normal)   Pulse 84   Temp 98 F (36.7 C) (Oral)   Resp 15   Ht 4' 11.75" (1.518 m)   Wt 189 lb 6.4 oz (85.9 kg)   SpO2 94%   BMI 37.30 kg/m   BP Readings from Last 3 Encounters:  03/28/18 122/70  12/27/17 (!) 158/82  09/27/17 (!) 150/78    Wt Readings from Last 3 Encounters:  03/28/18 189 lb 6.4 oz (85.9 kg)  12/27/17 198 lb 6.4 oz (90 kg)  09/27/17 194 lb (88 kg)    General appearance: alert, cooperative and appears stated age Ears: normal TM's and external ear canals both ears Throat: lips, mucosa, and tongue normal; teeth and gums normal Neck: no adenopathy, no carotid bruit, supple, symmetrical, trachea midline and thyroid not enlarged, symmetric, no tenderness/mass/nodules Back: symmetric, no curvature. ROM normal. No CVA tenderness. Lungs: clear to auscultation bilaterally Heart: regular rate and  rhythm, S1, S2 normal, no murmur, click, rub or gallop Abdomen: soft, non-tender; bowel sounds normal; no masses,  no organomegaly Pulses: 2+ and symmetric Skin: Skin color, texture, turgor normal. No rashes or lesions Lymph nodes: Cervical, supraclavicular, and axillary nodes normal.  Lab Results  Component Value Date   HGBA1C 7.9 12/29/2017   HGBA1C 12.4 (H) 09/27/2017   HGBA1C 7.2 (H) 12/17/2014    Lab Results  Component Value Date   CREATININE 0.90 03/28/2018   CREATININE 0.77 01/04/2018   CREATININE 0.87 09/27/2017    Lab Results  Component Value Date   WBC 8.9 04/11/2013   HGB 12.5 04/11/2013   HCT 37.9 04/11/2013   PLT 268.0 04/11/2013   GLUCOSE 148 (H) 03/28/2018   CHOL 235 (H) 03/28/2018   TRIG 136.0 03/28/2018   HDL 49.50 03/28/2018   LDLDIRECT 172.0 09/27/2017   LDLCALC 159 (H) 03/28/2018   ALT 12 03/28/2018   AST 14 03/28/2018   NA 137 03/28/2018   K 4.3 03/28/2018   CL 103 03/28/2018   CREATININE 0.90 03/28/2018   BUN 24 (H) 03/28/2018   CO2 26 03/28/2018   TSH 1.37 09/27/2017   HGBA1C 7.9 12/29/2017   MICROALBUR 2.3 (H) 09/27/2017    No results found.  Assessment & Plan:   Problem List Items Addressed This Visit    Type II diabetes mellitus, uncontrolled ([place])    Blood sugars have improved by patient recall but it is unclear if she is at goal and she has had hypoglycemia x 2 on submaximal doses of glipizide due to timing of meals.  She has declined therapy wth Jardiance . Will recommend adding januvia if repeat A1c is > 7.0. Continue ARB,  Advised to consider adding asa for primary protection    Lab Results  Component Value Date   HGBA1C 7.9 12/29/2017   Lab Results  Component Value Date   MICROALBUR 2.3 (H) 09/27/2017          Relevant Medications   glipiZIDE (GLUCOTROL) 5 MG tablet   Hyperlipidemia associated with type 2 diabetes mellitus ([place]) - Primary    Untreated due to intolerance to Lipitor. 10 yr risk using FRC is 25% .  Recommending repeat trial.   Lab Results  Component Value Date   CHOL 235 (H) 03/28/2018   HDL 49.50 03/28/2018   LDLCALC 159 (H) 03/28/2018   LDLDIRECT 172.0 09/27/2017   TRIG 136.0 03/28/2018   CHOLHDL 5 03/28/2018         Relevant Medications   glipiZIDE (GLUCOTROL) 5 MG tablet   Other Relevant Orders   Lipid panel (Completed)   Essential hypertension, benign    Well controlled on current regimen. Renal function stable, no changes today.  Lab Results  Component Value Date   CREATININE 0.90 03/28/2018   Lab Results  Component Value Date   NA 137 03/28/2018   K 4.3 03/28/2018   CL 103 03/28/2018   CO2 26 03/28/2018  Statin intolerance    Di not tolerate lipitor and gemfibrozil due to persistent dizziness.      Morbid obesity ([place])    I have addressed  BMI and recommended a low glycemic index diet utilizing smaller more frequent meals to increase metabolism.  I have also recommended that patient start exercising with a goal of 30 minutes of aerobic exercise a minimum of 5 days per week.      Relevant Medications   glipiZIDE (GLUCOTROL) 5 MG tablet    Other Visit Diagnoses    Screening for breast cancer       Relevant Orders   MM DIGITAL SCREENING BILATERAL      I have discontinued Jeanette Spencer empagliflozin. I am also having her maintain her glucose blood, metFORMIN, losartan-hydrochlorothiazide, and glipiZIDE.  Meds ordered this encounter  Medications  . glipiZIDE (GLUCOTROL) 5 MG tablet    Sig: Take 1 tablet (5 mg total) by mouth 2 (two) times daily before a meal.    Dispense:  60 tablet    Refill:  3    Medications Discontinued During This Encounter  Medication Reason  . empagliflozin (JARDIANCE) 25 MG TABS tablet Patient Preference  . glipiZIDE (GLUCOTROL) 5 MG tablet Reorder    Follow-up: Return in about 4 months (around 07/29/2018) for follow up diabetes.   [name], MD

## 2018-03-28 NOTE — Patient Instructions (Signed)
We did everything but your A1c today because your insurance will deny payment if it is done before July 6  I recommend getting back on glipizide 5 mg twice daily and continuing metformin as well  Please return for the A1c on or after July 6 and if it is > 8.0 I will adjust your glipizide dose  Remember that glipizide should not be taken except riight before a meal   To curb your appetite:  Tyr drinkig a glass of water with metamucil, citrucel, or benefiber in it 30 minutes before your biggest meal

## 2018-03-30 NOTE — Assessment & Plan Note (Signed)
I have addressed  BMI and recommended a low glycemic index diet utilizing smaller more frequent meals to increase metabolism.  I have also recommended that patient start exercising with a goal of 30 minutes of aerobic exercise a minimum of 5 days per week.  

## 2018-03-30 NOTE — Assessment & Plan Note (Signed)
Blood sugars have improved by patient recall but it is unclear if she is at goal and she has had hypoglycemia x 2 on submaximal doses of glipizide due to timing of meals.  She has declined therapy wth Jardiance . Will recommend adding januvia if repeat A1c is > 7.0. Continue ARB,  Advised to consider adding asa for primary protection    Lab Results  Component Value Date   HGBA1C 7.9 12/29/2017   Lab Results  Component Value Date   MICROALBUR 2.3 (H) 09/27/2017

## 2018-03-30 NOTE — Assessment & Plan Note (Signed)
Untreated due to intolerance to Lipitor. 10 yr risk using FRC is 25% . Recommending repeat trial.   Lab Results  Component Value Date   CHOL 235 (H) 03/28/2018   HDL 49.50 03/28/2018   LDLCALC 159 (H) 03/28/2018   LDLDIRECT 172.0 09/27/2017   TRIG 136.0 03/28/2018   CHOLHDL 5 03/28/2018

## 2018-03-30 NOTE — Assessment & Plan Note (Addendum)
Di not tolerate lipitor and gemfibrozil due to persistent dizziness.

## 2018-03-30 NOTE — Assessment & Plan Note (Signed)
Well controlled on current regimen. Renal function stable, no changes today.  Lab Results  Component Value Date   CREATININE 0.90 03/28/2018   Lab Results  Component Value Date   NA 137 03/28/2018   K 4.3 03/28/2018   CL 103 03/28/2018   CO2 26 03/28/2018

## 2018-04-05 ENCOUNTER — Other Ambulatory Visit: Payer: Medicare Other

## 2018-04-05 ENCOUNTER — Ambulatory Visit (INDEPENDENT_AMBULATORY_CARE_PROVIDER_SITE_OTHER): Payer: Medicare Other

## 2018-04-05 ENCOUNTER — Telehealth: Payer: Self-pay | Admitting: Internal Medicine

## 2018-04-05 DIAGNOSIS — Z1239 Encounter for other screening for malignant neoplasm of breast: Secondary | ICD-10-CM

## 2018-04-05 DIAGNOSIS — E119 Type 2 diabetes mellitus without complications: Secondary | ICD-10-CM | POA: Diagnosis not present

## 2018-04-05 LAB — POCT GLYCOSYLATED HEMOGLOBIN (HGB A1C): Hemoglobin A1C: 7 % — AB (ref 4.0–5.6)

## 2018-04-05 MED ORDER — COENZYME Q10 10 MG PO CAPS: 10.0000 mg | ORAL_CAPSULE | Freq: Every day | ORAL | 0 refills | Status: DC

## 2018-04-05 MED ORDER — ATORVASTATIN CALCIUM 20 MG PO TABS: 20.0000 mg | ORAL_TABLET | Freq: Every day | ORAL | 3 refills | Status: DC

## 2018-04-05 NOTE — Telephone Encounter (Signed)
rx sent.  I also recommend that she take a n OTC Co Q 10 daily,  this has been shown to make statins more tolerable.  10 mg daily

## 2018-04-05 NOTE — Telephone Encounter (Signed)
Pt would like to let you know that she decided that she will try a low dose of Lipitor.

## 2018-04-05 NOTE — Telephone Encounter (Signed)
fyi

## 2018-04-05 NOTE — Telephone Encounter (Signed)
Pt wanted Dr. Derrel Nip to know she would try a low dose of Lipator.

## 2018-04-05 NOTE — Progress Notes (Signed)
Patient comes in today for  POCT A1C. Collected sample wtih finger stick. Informed patient we will call her once her PCP reviews the results. Patient verbalized understanding and tolerated well. Patients A1C today is 7.0.

## 2018-04-06 NOTE — Telephone Encounter (Signed)
Patient notified

## 2018-04-12 DIAGNOSIS — Z1231 Encounter for screening mammogram for malignant neoplasm of breast: Secondary | ICD-10-CM | POA: Diagnosis not present

## 2018-04-12 LAB — HM MAMMOGRAPHY

## 2018-05-30 ENCOUNTER — Ambulatory Visit (INDEPENDENT_AMBULATORY_CARE_PROVIDER_SITE_OTHER): Payer: Medicare Other

## 2018-05-30 VITALS — BP 128/70 | HR 81 | Temp 98.2°F | Resp 15 | Ht 59.75 in | Wt 196.4 lb

## 2018-05-30 DIAGNOSIS — Z0000 Encounter for general adult medical examination without abnormal findings: Secondary | ICD-10-CM

## 2018-05-30 NOTE — Progress Notes (Addendum)
Subjective:   Jeanette Spencer is a 67 y.o. female who presents for an Initial Medicare Annual Wellness Visit.  Review of Systems    No ROS.  Medicare Wellness Visit. Additional risk factors are reflected in the social history.  Cardiac Risk Factors include: advanced age (>42men, >4 women);hypertension;diabetes mellitus;obesity (BMI >30kg/m2)     Objective:    Today's Vitals   05/30/18 0910  BP: 128/70  Pulse: 81  Resp: 15  Temp: 98.2 F (36.8 C)  TempSrc: Oral  SpO2: 97%  Weight: 196 lb 6.4 oz (89.1 kg)  Height: 4' 11.75" (1.518 m)   Body mass index is 38.68 kg/m.  Advanced Directives 05/30/2018 06/27/2016  Does Patient Have a Medical Advance Directive? No No  Would patient like information on creating a medical advance directive? No - Patient declined No - patient declined information    Current Medications (verified) Outpatient Encounter Medications as of 05/30/2018  Medication Sig  . atorvastatin (LIPITOR) 20 MG tablet Take 1 tablet (20 mg total) by mouth daily.  [name] glipiZIDE (GLUCOTROL) 5 MG tablet Take 1 tablet (5 mg total) by mouth 2 (two) times daily before a meal.  . glucose blood test strip Use as instructed to check sugars twice daily.  Uncontrolled diabetes mellitus E11.65  . losartan-hydrochlorothiazide (HYZAAR) 50-12.5 MG tablet Take 1 tablet by mouth daily.  . metFORMIN (GLUCOPHAGE) 850 MG tablet Take 1 tablet (850 mg total) by mouth 2 (two) times daily with a meal.  . Coenzyme Q10 10 MG capsule Take 1 capsule (10 mg total) by mouth daily. (Patient not taking: Reported on 05/30/2018)   No facility-administered encounter medications on file as of 05/30/2018.     Allergies (verified) Lipitor [atorvastatin]   History: Past Medical History:  Diagnosis Date  . Allergy   . Arthritis   . Chicken pox   . Chronic bronchitis ([place])   . Diabetes mellitus without complication ([place])   . Phlebitis    Past Surgical History:  Procedure Laterality Date  . ABDOMINAL  HYSTERECTOMY    . abdominal tumor Left    size of grapefruit  . TONSILECTOMY, ADENOIDECTOMY, BILATERAL MYRINGOTOMY AND TUBES     Family History  Problem Relation Age of Onset  . Diabetes Mother   . Cancer Father   . Cancer Paternal Aunt   . Cancer Paternal Uncle   . Diabetes Paternal Grandmother    Social History   Socioeconomic History  . Marital status: Single    Spouse name: Not on file  . Number of children: Not on file  . Years of education: Not on file  . Highest education level: Not on file  Occupational History  . Not on file  Social Needs  . Financial resource strain: Not hard at all  . Food insecurity:    Worry: Never true    Inability: Never true  . Transportation needs:    Medical: No    Non-medical: No  Tobacco Use  . Smoking status: Never Smoker  . Smokeless tobacco: Never Used  Substance and Sexual Activity  . Alcohol use: Yes    Comment: rarely  . Drug use: No  . Sexual activity: Never  Lifestyle  . Physical activity:    Days per week: Not on file    Minutes per session: Not on file  . Stress: Only a little  Relationships  . Social connections:    Talks on phone: Not on file    Gets together: Not on file  Attends religious service: Not on file    Active member of club or organization: Not on file    Attends meetings of clubs or organizations: Not on file    Relationship status: Divorced  Other Topics Concern  . Not on file  Social History Narrative  . Not on file    Tobacco Counseling Counseling given: Not Answered   Clinical Intake:  Pre-visit preparation completed: Yes  Pain : No/denies pain     Nutritional Status: BMI > 30  Obese Diabetes: Yes(Followed by pcp)  How often do you need to have someone help you when you read instructions, pamphlets, or other written materials from your doctor or pharmacy?: 1 - Never  Interpreter Needed?: No      Activities of Daily Living In your present state of health, do you have any  difficulty performing the following activities: 05/30/2018  Hearing? N  Vision? N  Difficulty concentrating or making decisions? N  Walking or climbing stairs? N  Dressing or bathing? N  Doing errands, shopping? N  Preparing Food and eating ? N  Using the Toilet? N  In the past six months, have you accidently leaked urine? Y  Comment Managed with a daily liner  Do you have problems with loss of bowel control? N  Managing your Medications? N  Managing your Finances? N  Housekeeping or managing your Housekeeping? N  Some recent data might be hidden     Immunizations and Health Maintenance Immunization History  Administered Date(s) Administered  . Influenza Whole 07/23/2012  . Influenza-Unspecified 07/11/2013, 07/10/2014, 07/11/2017  . Pneumococcal Conjugate-13 07/12/2013  . Pneumococcal Polysaccharide-23 09/27/2017  . Tdap 05/23/2005, 07/12/2013   Health Maintenance Due  Topic Date Due  . Hepatitis C Screening  1951-05-15  . DEXA SCAN  11/05/2014  . COLONOSCOPY  04/11/2017  . INFLUENZA VACCINE  04/26/2018    Patient Care Team: [name], MD as PCP - General (Internal Medicine)  Indicate any recent Medical Services you may have received from other than [facility] providers in the past year (date may be approximate).     Assessment:   This is a routine wellness examination for Jeanette Spencer.  The goal of the wellness visit is to assist the patient how to close the gaps in care and create a preventative care plan for the patient.   The roster of all physicians providing medical care to patient is listed in the Snapshot section of the chart.  Osteoporosis risk reviewed.    Safety issues reviewed; Smoke and carbon monoxide detectors in the home. No firearms in the home. Wears seatbelts when driving or riding with others. No violence in the home.  They do not have excessive sun exposure.  Discussed the need for sun protection: hats, long sleeves and the use of sunscreen if there  is significant sun exposure.  Patient is alert, normal appearance, oriented to person/place/and time.  Correctly identified the president of the [place] and recalls of 2/3 words. Performs simple calculations and can read correct time from watch face.  Displays appropriate judgement.  No new identified risk were noted.  No failures at ADL's or IADL's.    BMI- discussed the importance of a healthy diet, water intake and the benefits of aerobic exercise. Educational material provided.   24 hour diet recall: Moderate diet  Dental- UTD.  Eye- Visual acuity not assessed per patient preference since they have regular follow up with the ophthalmologist.  Wears corrective lenses.  Sleep patterns- Sleeps without issues.  She admits staying awake for long hours due to watching television for a lengthy periods.   Hepatitis C screening discussed. She will notify pcp if interested in receiving the screening during future lab draws. Educational material provided.   Dexa Scan discussed. Deferred per patient request. Educational material provided.   Influenza vaccine discussed. She plans to receive later in the season.   Colonoscopy discussed. Deferred to pcp for follow up. Educational material provided.   Patient Concerns: None at this time. Follow up with PCP as needed.  Hearing/Vision screen Hearing Screening Comments: Patient is able to hear conversational tones without difficulty.  No issues reported.   Vision Screening Comments: Followed by [facility]  Diabetic eye exam Wears corrective lenses Last OV 09/2017 Visual acuity not assessed per patient preference since they have regular follow up with the ophthalmologist.  Seen within the last 12 months.   Dietary issues and exercise activities discussed: Current Exercise Habits: The patient does not participate in regular exercise at present  Goals    . DIET - INCREASE LEAN PROTEINS     Low carb diet    . Increase physical activity      Stay active with walking, chair exercises      Depression Screen PHQ 2/9 Scores 05/30/2018 09/27/2017  PHQ - 2 Score 0 0  PHQ- 9 Score - 1    Fall Risk Fall Risk  05/30/2018 09/27/2017  Falls in the past year? No No   Cognitive Function: MMSE - Mini Mental State Exam 05/30/2018  Orientation to time 5  Orientation to Place 5  Registration 3  Attention/ Calculation 5  Recall 2  Language- name 2 objects 2  Language- repeat 1  Language- follow 3 step command 3  Language- read & follow direction 1  Write a sentence 1  Copy design 1  Total score 29        Screening Tests Health Maintenance  Topic Date Due  . Hepatitis C Screening  1951/02/04  . DEXA SCAN  11/05/2014  . COLONOSCOPY  04/11/2017  . INFLUENZA VACCINE  04/26/2018  . FOOT EXAM  09/27/2018  . HEMOGLOBIN A1C  10/06/2018  . OPHTHALMOLOGY EXAM  11/27/2018  . MAMMOGRAM  04/12/2020  . TETANUS/TDAP  07/13/2023  . PNA vac Low Risk Adult  Completed     Plan:   End of life planning; Advanced aging; Advanced directives discussed.  No HCPOA/Living Will.  Additional information declined at this time.  I have personally reviewed and noted the following in the patient's chart:   . Medical and social history . Use of alcohol, tobacco or illicit drugs  . Current me dications and supplements . Functional ability and status . Nutritional status . Physical activity . Advanced directives . List of other physicians . Hospitalizations, surgeries, and ER visits in previous 12 months . Vitals . Screenings to include cognitive, depression, and falls . Referrals and appointments  In addition, I have reviewed and discussed with patient certain preventive protocols, quality metrics, and best practice recommendations. A written personalized care plan for preventive services as well as general preventive health recommendations were provided to patient.     OBrien-Blaney, Denisa L, LPN   0/03/8674      I have reviewed the above  information and agree with above.   Deborra Medina, MD

## 2018-05-30 NOTE — Patient Instructions (Addendum)
Jeanette Spencer , Thank you for taking time to come for your Medicare Wellness Visit. I appreciate your ongoing commitment to your health goals. Please review the following plan we discussed and let me know if I can assist you in the future.   Notify your doctor if interested in having the hepatitis c screening at you next lab appointment. Educational material provided.   Consider a bone density/dexa scan. Educational material provided.  Its flu season! Get vaccinated and notify us when you do OR get your vaccine here when you return.  Talk to your doctor about scheduling your colonoscopy or COLOguard option. Educational material provided.   Nice to meet you!  Have a great day!  These are the goals we discussed: Goals    . DIET - INCREASE LEAN PROTEINS     Low carb diet    . Increase physical activity     Stay active with walking, chair exercises       This is a list of the screening recommended for you and due dates:  Health Maintenance  Topic Date Due  .  Hepatitis C: One time screening is recommended by Center for Disease Control  (CDC) for  adults born from [birth year] through 1966.   01-Feb-1951  . DEXA scan (bone density measurement)  11/05/2014  . Colon Cancer Screening  04/11/2017  . Flu Shot  04/26/2018  . Complete foot exam   09/27/2018  . Hemoglobin A1C  10/06/2018  . Eye exam for diabetics  11/27/2018  . Mammogram  04/12/2020  . Tetanus Vaccine  07/13/2023  . Pneumonia vaccines  Completed   Bone Densitometry Bone densitometry is an imaging test that uses a special X-ray to measure the amount of calcium and other minerals in your bones (bone density). This test is also known as a bone mineral density test or dual-energy X-ray absorptiometry (DXA). The test can measure bone density at your hip and your spine. It is similar to having a regular X-ray. You may have this test to:  Diagnose a condition that causes weak or thin bones (osteoporosis).  Predict your risk of a broken  bone (fracture).  Determine how well osteoporosis treatment is working.  Tell a health care provider about:  Any allergies you have.  All medicines you are taking, including vitamins, herbs, eye drops, creams, and over-the-counter medicines.  Any problems you or family members have had with anesthetic medicines.  Any blood disorders you have.  Any surgeries you have had.  Any medical conditions you have.  Possibility of pregnancy.  Any other medical test you had within the previous 14 days that used contrast material. What are the risks? Generally, this is a safe procedure. However, problems can occur and may include the following:  This test exposes you to a very small amount of radiation.  The risks of radiation exposure may be greater to unborn children.  What happens before the procedure?  Do not take any calcium supplements for 24 hours before having the test. You can otherwise eat and drink what you usually do.  Take off all metal jewelry, eyeglasses, dental appliances, and any other metal objects. What happens during the procedure?  You may lie on an exam table. There will be an X-ray generator below you and an imaging device above you.  Other devices, such as boxes or braces, may be used to position your body properly for the scan.  You will need to lie still while the machine slowly scans your body.  The images will show up on a computer monitor. What happens after the procedure? You may need more testing at a later time. This information is not intended to replace advice given to you by your health care provider. Make sure you discuss any questions you have with your health care provider. Document Released: 10/04/2004 Document Revised: 02/18/2016 Document Reviewed: 02/20/2014 Elsevier Interactive Patient Education  2018 [employer].   Colonoscopy, Adult A colonoscopy is an exam to look at the entire large intestine. During the exam, a lubricated, bendable  tube is inserted into the anus and then passed into the rectum, colon, and other parts of the large intestine. A colonoscopy is often done as a part of normal colorectal screening or in response to certain symptoms, such as anemia, persistent diarrhea, abdominal pain, and blood in the stool. The exam can help screen for and diagnose medical problems, including:  Tumors.  Polyps.  Inflammation.  Areas of bleeding.  Tell a health care provider about:  Any allergies you have.  All medicines you are taking, including vitamins, herbs, eye drops, creams, and over-the-counter medicines.  Any problems you or family members have had with anesthetic medicines.  Any blood disorders you have.  Any surgeries you have had.  Any medical conditions you have.  Any problems you have had passing stool. What are the risks? Generally, this is a safe procedure. However, problems may occur, including:  Bleeding.  A tear in the intestine.  A reaction to medicines given during the exam.  Infection (rare).  What happens before the procedure? Eating and drinking restrictions Follow instructions from your health care provider about eating and drinking, which may include:  A few days before the procedure - follow a low-fiber diet. Avoid nuts, seeds, dried fruit, raw fruits, and vegetables.  1-3 days before the procedure - follow a clear liquid diet. Drink only clear liquids, such as clear broth or bouillon, black coffee or tea, clear juice, clear soft drinks or sports drinks, gelatin dessert, and popsicles. Avoid any liquids that contain red or purple dye.  On the day of the procedure - do not eat or drink anything during the 2 hours before the procedure, or within the time period that your health care provider recommends.  Bowel prep If you were prescribed an oral bowel prep to clean out your colon:  Take it as told by your health care provider. Starting the day before your procedure, you will  need to drink a large amount of medicated liquid. The liquid will cause you to have multiple loose stools until your stool is almost clear or light green.  If your skin or anus gets irritated from diarrhea, you may use these to relieve the irritation: ? Medicated wipes, such as adult wet wipes with aloe and vitamin E. ? A skin soothing-product like petroleum jelly.  If you vomit while drinking the bowel prep, take a break for up to 60 minutes and then begin the bowel prep again. If vomiting continues and you cannot take the bowel prep without vomiting, call your health care provider.  General instructions  Ask your health care provider about changing or stopping your regular medicines. This is especially important if you are taking diabetes medicines or blood thinners.  Plan to have someone take you home from the hospital or clinic. What happens during the procedure?  An IV tube may be inserted into one of your veins.  You will be given medicine to help you relax (sedative).  To reduce your risk of infection: ? Your health care team will wash or sanitize their hands. ? Your anal area will be washed with soap.  You will be asked to lie on your side with your knees bent.  Your health care provider will lubricate a long, thin, flexible tube. The tube will have a camera and a light on the end.  The tube will be inserted into your anus.  The tube will be gently eased through your rectum and colon.  Air will be delivered into your colon to keep it open. You may feel some pressure or cramping.  The camera will be used to take images during the procedure.  A small tissue sample may be removed from your body to be examined under a microscope (biopsy). If any potential problems are found, the tissue will be sent to a lab for testing.  If small polyps are found, your health care provider may remove them and have them checked for cancer cells.  The tube that was inserted into your anus  will be slowly removed. The procedure may vary among health care providers and hospitals. What happens after the procedure?  Your blood pressure, heart rate, breathing rate, and blood oxygen level will be monitored until the medicines you were given have worn off.  Do not drive for 24 hours after the exam.  You may have a small amount of blood in your stool.  You may pass gas and have mild abdominal cramping or bloating due to the air that was used to inflate your colon during the exam.  It is up to you to get the results of your procedure. Ask your health care provider, or the department performing the procedure, when your results will be ready. This information is not intended to replace advice given to you by your health care provider. Make sure you discuss any questions you have with your health care provider. Document Released: 09/09/2000 Document Revised: 07/13/2016 Document Reviewed: 11/24/2015 Elsevier Interactive Patient Education  2018 [employer].

## 2018-06-11 LAB — HM DIABETES EYE EXAM

## 2018-07-27 ENCOUNTER — Other Ambulatory Visit: Payer: Self-pay | Admitting: Internal Medicine

## 2018-07-27 DIAGNOSIS — E119 Type 2 diabetes mellitus without complications: Secondary | ICD-10-CM

## 2018-08-06 ENCOUNTER — Ambulatory Visit (INDEPENDENT_AMBULATORY_CARE_PROVIDER_SITE_OTHER): Payer: Medicare Other | Admitting: Internal Medicine

## 2018-08-06 ENCOUNTER — Telehealth: Payer: Self-pay | Admitting: Internal Medicine

## 2018-08-06 ENCOUNTER — Encounter: Payer: Self-pay | Admitting: Internal Medicine

## 2018-08-06 VITALS — BP 142/84 | HR 66 | Temp 98.2°F | Resp 15 | Ht 59.75 in | Wt 192.8 lb

## 2018-08-06 DIAGNOSIS — Z1211 Encounter for screening for malignant neoplasm of colon: Secondary | ICD-10-CM

## 2018-08-06 DIAGNOSIS — E1121 Type 2 diabetes mellitus with diabetic nephropathy: Secondary | ICD-10-CM

## 2018-08-06 DIAGNOSIS — I10 Essential (primary) hypertension: Secondary | ICD-10-CM

## 2018-08-06 DIAGNOSIS — E1169 Type 2 diabetes mellitus with other specified complication: Secondary | ICD-10-CM

## 2018-08-06 DIAGNOSIS — E785 Hyperlipidemia, unspecified: Secondary | ICD-10-CM | POA: Diagnosis not present

## 2018-08-06 DIAGNOSIS — E1165 Type 2 diabetes mellitus with hyperglycemia: Secondary | ICD-10-CM

## 2018-08-06 LAB — COMPREHENSIVE METABOLIC PANEL
ALBUMIN: 4.3 g/dL (ref 3.5–5.2)
ALK PHOS: 59 U/L (ref 39–117)
ALT: 15 U/L (ref 0–35)
AST: 12 U/L (ref 0–37)
BILIRUBIN TOTAL: 0.3 mg/dL (ref 0.2–1.2)
BUN: 22 mg/dL (ref 6–23)
CO2: 29 mEq/L (ref 19–32)
Calcium: 9.5 mg/dL (ref 8.4–10.5)
Chloride: 105 mEq/L (ref 96–112)
Creatinine, Ser: 0.83 mg/dL (ref 0.40–1.20)
GFR: 87.72 mL/min (ref >60.00)
Glucose, Bld: 156 mg/dL — ABNORMAL HIGH (ref 70–99)
POTASSIUM: 4.5 meq/L (ref 3.5–5.1)
SODIUM: 140 meq/L (ref 135–145)
TOTAL PROTEIN: 7.3 g/dL (ref 6.0–8.3)

## 2018-08-06 LAB — LIPID PANEL
CHOLESTEROL: 172 mg/dL (ref 0–200)
HDL: 57.8 mg/dL (ref >39.00)
LDL Cholesterol: 84 mg/dL (ref 0–99)
NonHDL: 114.43
Total CHOL/HDL Ratio: 3
Triglycerides: 152 mg/dL — ABNORMAL HIGH (ref 0.0–149.0)
VLDL: 30.4 mg/dL (ref 0.0–40.0)

## 2018-08-06 LAB — MICROALBUMIN / CREATININE URINE RATIO
Creatinine,U: 135.8 mg/dL
MICROALB UR: 5 mg/dL — AB (ref 0.0–1.9)
Microalb Creat Ratio: 3.7 mg/g (ref 0.0–30.0)

## 2018-08-06 LAB — POCT GLYCOSYLATED HEMOGLOBIN (HGB A1C): Hemoglobin A1C: 6.9 % — AB (ref 4.0–5.6)

## 2018-08-06 MED ORDER — OLMESARTAN MEDOXOMIL-HCTZ 40-25 MG PO TABS: 1.0000 | ORAL_TABLET | Freq: Every day | ORAL | 2 refills | Status: DC

## 2018-08-06 MED ORDER — GLIPIZIDE 5 MG PO TABS: 5.0000 mg | ORAL_TABLET | Freq: Two times a day (BID) | ORAL | 3 refills | Status: DC

## 2018-08-06 MED ORDER — TELMISARTAN-HCTZ 80-25 MG PO TABS: 1.0000 | ORAL_TABLET | Freq: Every day | ORAL | 0 refills | Status: DC

## 2018-08-06 NOTE — Telephone Encounter (Signed)
Please advise on alternative.  

## 2018-08-06 NOTE — Telephone Encounter (Signed)
I am ordering blindly because her formulary information is not loading,.   She is with [facility]  ,  Why can't they load her coverage info?   Olmesartan/hct sent

## 2018-08-06 NOTE — Telephone Encounter (Signed)
Copied from [place] [phone]. Topic: Quick Communication - See Telephone Encounter >> Aug 06, 2018  9:45 AM Ivar Drape wrote: CRM for notification. See Telephone encounter for: 08/06/18. Patient stated that the telmisartan-hydrochlorothiazide (MICARDIS HCT) 80-25 MG tablet medication that she was prescribed today cost over $100 and she can't afford that.  She would like to be prescribed something else that's cheaper.

## 2018-08-06 NOTE — Assessment & Plan Note (Signed)
Blood sugars have improved by patient recall  And she is at goal .  She has had hypoglycemia x 2 on submaximal doses of glipizide due to timing of meals.  She has declined therapy wth Jardiance. Continue ARB,  Advised to consider adding asa for primary protection    Lab Results  Component Value Date   HGBA1C 6.9 (A) 08/06/2018   Lab Results  Component Value Date   MICROALBUR 2.3 (H) 09/27/2017

## 2018-08-06 NOTE — Progress Notes (Signed)
Subjective:  Patient ID: Jeanette Spencer, female    DOB: [birth year]  Age: 67 y.o. MRN: [id number]  CC: The primary encounter diagnosis was Hyperlipidemia associated with type 2 diabetes mellitus ([place]). Diagnoses of Essential hypertension, benign, Uncontrolled type 2 diabetes mellitus with hyperglycemia ([place]), Colon cancer screening, Diabetic nephropathy associated with type 2 diabetes mellitus ([place]), and Type 2 diabetes mellitus with nephropathy ([place]) were also pertinent to this visit.  HPI Jeanette Spencer presents for 3 month follow up on diabetes.  Patient has no complaints today.  Patient is following a low glycemic index diet and taking all prescribed medications regularly without side effects.  Fasting sugars have been under  130 most of the time ,  Had two lows , to  60 and 80 , symptomatic . after eating and dinner and skipping dessert.  Does not check a post prandial unless she feels bad.  Patient  Has lost 4 lbs since last visit,  Is not exercising regularly or  intentionally trying to lose weight .  Patient has had an eye exam in September  By Dr Steffanie Rainwater and checks feet regularly for signs of infection.  Patient does not walk barefoot outside,  And denies any numbness tingling or burning in feet. Patient is up to date on all recommended vaccinations  Has not taken losartan hct in 3 weeks due to medication recall.    Taking co Q 10 but back of legs starting aching with the higher (100 mg ) dose so she stopped    Vaccination status :  Bret [place]e Was sick in bed for a full week after her  pneumovax vaccine  In January  With sweats,  Chills  Malaise blames the vaccine    Overdue for colon cancer screening  Discussed cologauard   Lab Results  Component Value Date   HGBA1C 6.9 (A) 08/06/2018   Lab Results  Component Value Date   MICROALBUR 2.3 (H) 09/27/2017     Outpatient Medications Prior to Visit  Medication Sig Dispense Refill  . atorvastatin (LIPITOR) 20 MG tablet Take  1 tablet (20 mg total) by mouth daily. 90 tablet 3  . glucose blood test strip Use as instructed to check sugars twice daily.  Uncontrolled diabetes mellitus E11.65 100 each 12  . metFORMIN (GLUCOPHAGE) 850 MG tablet TAKE 1 TABLET BY MOUTH TWICE DAILY WITH MEALS 180 tablet 2  . glipiZIDE (GLUCOTROL) 5 MG tablet Take 1 tablet (5 mg total) by mouth 2 (two) times daily before a meal. 60 tablet 3  . Coenzyme Q10 10 MG capsule Take 1 capsule (10 mg total) by mouth daily. (Patient not taking: Reported on 05/30/2018) 90 capsule 0  . losartan-hydrochlorothiazide (HYZAAR) 50-12.5 MG tablet Take 1 tablet by mouth daily. (Patient not taking: Reported on 08/06/2018) 90 tablet 3   No facility-administered medications prior to visit.     Review of Systems;  Patient denies headache, fevers, malaise, unintentional weight loss, skin rash, eye pain, sinus congestion and sinus pain, sore throat, dysphagia,  hemoptysis , cough, dyspnea, wheezing, chest pain, palpitations, orthopnea, edema, abdominal pain, nausea, melena, diarrhea, constipation, flank pain, dysuria, hematuria, urinary  Frequency, nocturia, numbness, tingling, seizures,  Focal weakness, Loss of consciousness,  Tremor, insomnia, depression, anxiety, and suicidal ideation.      Objective:  BP (!) 142/84 (BP Location: Left Arm, Patient Position: Sitting, Cuff Size: Normal)   Pulse 66   Temp 98.2 F (36.8 C) (Oral)   Resp 15   Ht  4' 11.75" (1.518 m)   Wt 192 lb 12.8 oz (87.5 kg)   SpO2 98%   BMI 37.97 kg/m   BP Readings from Last 3 Encounters:  08/06/18 (!) 142/84  05/30/18 128/70  03/28/18 122/70    Wt Readings from Last 3 Encounters:  08/06/18 192 lb 12.8 oz (87.5 kg)  05/30/18 196 lb 6.4 oz (89.1 kg)  03/28/18 189 lb 6.4 oz (85.9 kg)    General appearance: alert, cooperative and appears stated age Ears: normal TM's and external ear canals both ears Throat: lips, mucosa, and tongue normal; teeth and gums normal Neck: no adenopathy,  no carotid bruit, supple, symmetrical, trachea midline and thyroid not enlarged, symmetric, no tenderness/mass/nodules Back: symmetric, no curvature. ROM normal. No CVA tenderness. Lungs: clear to auscultation bilaterally Heart: regular rate and rhythm, S1, S2 normal, no murmur, click, rub or gallop Abdomen: soft, non-tender; bowel sounds normal; no masses,  no organomegaly Pulses: 2+ and symmetric Skin: Skin color, texture, turgor normal. No rashes or lesions Lymph nodes: Cervical, supraclavicular, and axillary nodes normal.  Lab Results  Component Value Date   HGBA1C 6.9 (A) 08/06/2018   HGBA1C 7.0 (A) 04/05/2018   HGBA1C 7.9 12/29/2017    Lab Results  Component Value Date   CREATININE 0.90 03/28/2018   CREATININE 0.77 01/04/2018   CREATININE 0.87 09/27/2017    Lab Results  Component Value Date   WBC 8.9 04/11/2013   HGB 12.5 04/11/2013   HCT 37.9 04/11/2013   PLT 268.0 04/11/2013   GLUCOSE 148 (H) 03/28/2018   CHOL 235 (H) 03/28/2018   TRIG 136.0 03/28/2018   HDL 49.50 03/28/2018   LDLDIRECT 172.0 09/27/2017   LDLCALC 159 (H) 03/28/2018   ALT 12 03/28/2018   AST 14 03/28/2018   NA 137 03/28/2018   K 4.3 03/28/2018   CL 103 03/28/2018   CREATININE 0.90 03/28/2018   BUN 24 (H) 03/28/2018   CO2 26 03/28/2018   TSH 1.37 09/27/2017   HGBA1C 6.9 (A) 08/06/2018   MICROALBUR 2.3 (H) 09/27/2017    No results found.  Assessment & Plan:   Problem List Items Addressed This Visit    Diabetic nephropathy associated with type 2 diabetes mellitus ([place])    Advised to resume ARB.  Substituting olmesartan for losartan  Lab Results  Component Value Date   MICROALBUR 2.3 (H) 09/27/2017   Lab Results  Component Value Date   CREATININE 0.90 03/28/2018         Relevant Medications   glipiZIDE (GLUCOTROL) 5 MG tablet   Essential hypertension, benign    Not at goal due to dc of losartan by patient due to recall  olmesartan hct prescribed      Relevant Orders    Comprehensive metabolic panel   Hyperlipidemia associated with type 2 diabetes mellitus ([place]) - Primary    Tolerating  Lipitor, restarted in July due to  10 yr risk of CAD using FRC as 25% .repeat labs are due.  She did not tolerate co q 10  Lab Results  Component Value Date   CHOL 235 (H) 03/28/2018   HDL 49.50 03/28/2018   LDLCALC 159 (H) 03/28/2018   LDLDIRECT 172.0 09/27/2017   TRIG 136.0 03/28/2018   CHOLHDL 5 03/28/2018         Relevant Medications   glipiZIDE (GLUCOTROL) 5 MG tablet   Other Relevant Orders   Lipid panel   Type 2 diabetes mellitus with nephropathy ([place])    Blood sugars have improved by  patient recall  And she is at goal .  She has had hypoglycemia x 2 on submaximal doses of glipizide due to timing of meals.  She has declined therapy wth Jardiance. Continue ARB,  Advised to consider adding asa for primary protection    Lab Results  Component Value Date   HGBA1C 6.9 (A) 08/06/2018   Lab Results  Component Value Date   MICROALBUR 2.3 (H) 09/27/2017          Relevant Medications   glipiZIDE (GLUCOTROL) 5 MG tablet    Other Visit Diagnoses    Colon cancer screening       Relevant Orders   Cologuard     A total of 25 minutes of face to face time was spent with patient more than half of which was spent in counselling about the above mentioned conditions  and coordination of care  I have discontinued Icis A. Beezley's losartan-hydrochlorothiazide. I am also having her maintain her glucose blood, atorvastatin, Coenzyme Q10, metFORMIN, and glipiZIDE.  Meds ordered this encounter  Medications  . DISCONTD: telmisartan-hydrochlorothiazide (MICARDIS HCT) 80-25 MG tablet    Sig: Take 1 tablet by mouth daily.    Dispense:  30 tablet    Refill:  0  . glipiZIDE (GLUCOTROL) 5 MG tablet    Sig: Take 1 tablet (5 mg total) by mouth 2 (two) times daily before a meal.    Dispense:  180 tablet    Refill:  3    Medications Discontinued During This Encounter    Medication Reason  . losartan-hydrochlorothiazide (HYZAAR) 50-12.5 MG tablet   . glipiZIDE (GLUCOTROL) 5 MG tablet Reorder    Follow-up: Return in about 3 months (around 11/06/2018) for follow up diabetes.   [name], MD

## 2018-08-06 NOTE — Patient Instructions (Addendum)
If your meal is low in carbs ( unbreaded meat or fish,  Green vegetable and salad , no cornbread potatoes or noodles  or rice)  Skip the glipizide but take the metformin   I am sending Cologuard to your house.  This is a screening test for colon cancer and is very accurate in patients who are at average risk for colon cancer, if  Done correctly (repeated  every 3 years) .  We  Can  use this until you are 82 for  colon CA screening, unless you develop a change in bowel function or a family history of colon Cancer.  These developments would  change you from being at average risk to increased risk for colon cancer and would require colonoscopy for ongoing screening .    Keep working on the weight loss!  Your goal is 189 lbs by next visit in 3 months

## 2018-08-06 NOTE — Assessment & Plan Note (Addendum)
Advised to resume ARB.  Substituting olmesartan for losartan  Lab Results  Component Value Date   MICROALBUR 2.3 (H) 09/27/2017   Lab Results  Component Value Date   CREATININE 0.90 03/28/2018

## 2018-08-06 NOTE — Assessment & Plan Note (Addendum)
Tolerating  Lipitor, restarted in July due to  10 yr risk of CAD using FRC as 25% .repeat labs are due.  She did not tolerate co q 10  Lab Results  Component Value Date   CHOL 235 (H) 03/28/2018   HDL 49.50 03/28/2018   LDLCALC 159 (H) 03/28/2018   LDLDIRECT 172.0 09/27/2017   TRIG 136.0 03/28/2018   CHOLHDL 5 03/28/2018

## 2018-08-06 NOTE — Assessment & Plan Note (Signed)
Not at goal due to dc of losartan by patient due to recall  olmesartan hct prescribed

## 2018-08-07 NOTE — Telephone Encounter (Signed)
Patient states her pharmacy just called her and informed her that her new medication was ready and it was $652. She states she can not afford that and wants to know if she just needs to hold off on taking this medicine.   CB# [phone]

## 2018-08-07 NOTE — Telephone Encounter (Signed)
Please advise on possible alternative?

## 2018-08-08 NOTE — Telephone Encounter (Signed)
olmesartan-hctz is going to cost $652. She is wanting to know if she just needs to hold off on this medication for now.

## 2018-08-09 MED ORDER — LISINOPRIL-HYDROCHLOROTHIAZIDE 20-25 MG PO TABS: 1.0000 | ORAL_TABLET | Freq: Every day | ORAL | 0 refills | Status: DC

## 2018-08-09 NOTE — Telephone Encounter (Signed)
Spoke with pharmacy and they have d/c'd both the telmisartan and the olmesartan. They stated that the lisinopril-hctz was going through for $10. Called the pt to let the pt know what medication was called in and that it was going to only cost her $10. Pt was very thankful.

## 2018-08-09 NOTE — Telephone Encounter (Signed)
plesae call her  phaemacy and dc telmisartan  a nd olmesartan,   We will try lisinopril  hct

## 2018-08-27 DIAGNOSIS — Z1211 Encounter for screening for malignant neoplasm of colon: Secondary | ICD-10-CM | POA: Diagnosis not present

## 2018-08-27 LAB — COLOGUARD

## 2018-09-02 ENCOUNTER — Telehealth: Payer: Self-pay | Admitting: Internal Medicine

## 2018-09-02 NOTE — Telephone Encounter (Signed)
Jeanette Spencer,  The results of  Your  cologuard is  Positive. This may indicate a polyp or a cancer somewhere in the colon.  I  would strongly recommend that you have a colonoscopy for further evaluation . Are you willing to go and you you have a  Preference who I send you to  ?   Regards,   Deborra Medina, MD

## 2018-09-04 NOTE — Telephone Encounter (Signed)
mychart message sent to Dr. Derrel Nip.

## 2018-09-06 ENCOUNTER — Telehealth: Payer: Self-pay

## 2018-09-06 DIAGNOSIS — R195 Other fecal abnormalities: Secondary | ICD-10-CM

## 2018-09-06 DIAGNOSIS — L659 Nonscarring hair loss, unspecified: Secondary | ICD-10-CM

## 2018-09-06 NOTE — Telephone Encounter (Signed)
Copied from [place] [id number]. Topic: General - Inquiry >> Sep 06, 2018 12:15 PM Vernona Rieger wrote: Reason for CRM: Melissa with [employer] called and wanted to confirm that the office received an abnormal colo-guard result that was faxed over on 09/09/18  Call back @ [phone] if you DID NOT receive it, prompt to provider support.

## 2018-09-06 NOTE — Telephone Encounter (Signed)
Yes and patient was  mycharted,  But I did not receive the message you received from her  On dec 10th so nothing has been done.  I do not know where you message was forwarded to so that means messages are getting lost   I will make the GI referral but you need to call her and let her know that if will no  Done before the 17th so   once we make the referral she can change the date if she is not available on the day it is made

## 2018-09-06 NOTE — Telephone Encounter (Signed)
I think I placed this in the yellow results folder. If you do not have will you let me know and I will call them to have it faxed over again.

## 2018-09-06 NOTE — Addendum Note (Signed)
Addended by: [name] on: 09/06/2018 09:38 PM   Modules accepted: Orders

## 2018-09-07 ENCOUNTER — Other Ambulatory Visit: Payer: Self-pay

## 2018-09-07 DIAGNOSIS — R195 Other fecal abnormalities: Secondary | ICD-10-CM

## 2018-09-11 ENCOUNTER — Telehealth: Payer: Self-pay

## 2018-09-11 MED ORDER — ACYCLOVIR 400 MG PO TABS: 400.0000 mg | ORAL_TABLET | Freq: Every day | ORAL | 1 refills | Status: DC

## 2018-09-11 MED ORDER — ACYCLOVIR 5 % EX OINT: 1.0000 "application " | TOPICAL_OINTMENT | CUTANEOUS | 1 refills | Status: DC

## 2018-09-11 NOTE — Telephone Encounter (Signed)
Acyclovir oral tablets called in,  Please cancel the ointment with the pharmacy

## 2018-09-11 NOTE — Telephone Encounter (Signed)
Spoke with pt and she stated that she woke up this morning with cold sores above her top lip and in her nose. Pt is wanting to know if something can be called in to help dry them up.

## 2018-09-11 NOTE — Addendum Note (Signed)
Addended by: [name] on: 09/11/2018 04:35 PM   Modules accepted: Orders

## 2018-09-11 NOTE — Telephone Encounter (Signed)
Zovirax ointment sent to  [facility]

## 2018-09-11 NOTE — Telephone Encounter (Signed)
Spoke with pt and she stated that she has used the ointments before and they don't do anything but give her a scab. Pt is wanting to know if a "pill" can be called in instead.

## 2018-09-11 NOTE — Telephone Encounter (Signed)
Spoke with pt and she stated that she is scheduled for 09/24/2018 with GI.

## 2018-09-12 ENCOUNTER — Encounter: Payer: Self-pay | Admitting: *Deleted

## 2018-09-12 ENCOUNTER — Other Ambulatory Visit: Payer: Self-pay

## 2018-09-12 NOTE — Telephone Encounter (Signed)
Spoke with pt and informed her that Dr. Derrel Nip sent in a prescription for tablets called acyclovir.Pt gave a verbal understanding. Attempted to call [facility] to cancel the ointment and didn't get an answer will try back again.

## 2018-09-17 NOTE — Discharge Instructions (Signed)
General Anesthesia, Adult, Care After  This sheet gives you information about how to care for yourself after your procedure. Your health care provider may also give you more specific instructions. If you have problems or questions, contact your health care provider.  What can I expect after the procedure?  After the procedure, the following side effects are common:  Pain or discomfort at the IV site.  Nausea.  Vomiting.  Sore throat.  Trouble concentrating.  Feeling cold or chills.  Weak or tired.  Sleepiness and fatigue.  Soreness and body aches. These side effects can affect parts of the body that were not involved in surgery.  Follow these instructions at home:    For at least 24 hours after the procedure:  Have a responsible adult stay with you. It is important to have someone help care for you until you are awake and alert.  Rest as needed.  Do not:  Participate in activities in which you could fall or become injured.  Drive.  Use heavy machinery.  Drink alcohol.  Take sleeping pills or medicines that cause drowsiness.  Make important decisions or sign legal documents.  Take care of children on your own.  Eating and drinking  Follow any instructions from your health care provider about eating or drinking restrictions.  When you feel hungry, start by eating small amounts of foods that are soft and easy to digest (bland), such as toast. Gradually return to your regular diet.  Drink enough fluid to keep your urine pale yellow.  If you vomit, rehydrate by drinking water, juice, or clear broth.  General instructions  If you have sleep apnea, surgery and certain medicines can increase your risk for breathing problems. Follow instructions from your health care provider about wearing your sleep device:  Anytime you are sleeping, including during daytime naps.  While taking prescription pain medicines, sleeping medicines, or medicines that make you drowsy.  Return to your normal activities as told by your health care  provider. Ask your health care provider what activities are safe for you.  Take over-the-counter and prescription medicines only as told by your health care provider.  If you smoke, do not smoke without supervision.  Keep all follow-up visits as told by your health care provider. This is important.  Contact a health care provider if:  You have nausea or vomiting that does not get better with medicine.  You cannot eat or drink without vomiting.  You have pain that does not get better with medicine.  You are unable to pass urine.  You develop a skin rash.  You have a fever.  You have redness around your IV site that gets worse.  Get help right away if:  You have difficulty breathing.  You have chest pain.  You have blood in your urine or stool, or you vomit blood.  Summary  After the procedure, it is common to have a sore throat or nausea. It is also common to feel tired.  Have a responsible adult stay with you for the first 24 hours after general anesthesia. It is important to have someone help care for you until you are awake and alert.  When you feel hungry, start by eating small amounts of foods that are soft and easy to digest (bland), such as toast. Gradually return to your regular diet.  Drink enough fluid to keep your urine pale yellow.  Return to your normal activities as told by your health care provider. Ask your health care   provider what activities are safe for you.  This information is not intended to replace advice given to you by your health care provider. Make sure you discuss any questions you have with your health care provider.  Document Released: 12/19/2000 Document Revised: 04/28/2017 Document Reviewed: 04/28/2017  Elsevier Interactive Patient Education  2019 [employer]

## 2018-09-24 ENCOUNTER — Ambulatory Visit: Payer: Medicare Other | Admitting: Anesthesiology

## 2018-09-24 ENCOUNTER — Ambulatory Visit
Admission: RE | Admit: 2018-09-24 | Discharge: 2018-09-24 | Disposition: A | Discharge status: Home / Self Care | Payer: Medicare Other | Attending: Gastroenterology | Admitting: Gastroenterology

## 2018-09-24 ENCOUNTER — Encounter
Admission: RE | Disposition: A | Discharge status: Home / Self Care | Payer: Self-pay | Source: Home / Self Care | Attending: Gastroenterology

## 2018-09-24 DIAGNOSIS — E119 Type 2 diabetes mellitus without complications: Secondary | ICD-10-CM | POA: Insufficient documentation

## 2018-09-24 DIAGNOSIS — D125 Benign neoplasm of sigmoid colon: Secondary | ICD-10-CM

## 2018-09-24 DIAGNOSIS — Z6837 Body mass index (BMI) 37.0-37.9, adult: Secondary | ICD-10-CM | POA: Diagnosis not present

## 2018-09-24 DIAGNOSIS — K635 Polyp of colon: Secondary | ICD-10-CM

## 2018-09-24 DIAGNOSIS — K640 First degree hemorrhoids: Secondary | ICD-10-CM | POA: Insufficient documentation

## 2018-09-24 DIAGNOSIS — Z79899 Other long term (current) drug therapy: Secondary | ICD-10-CM | POA: Diagnosis not present

## 2018-09-24 DIAGNOSIS — Z86711 Personal history of pulmonary embolism: Secondary | ICD-10-CM | POA: Diagnosis not present

## 2018-09-24 DIAGNOSIS — Z7984 Long term (current) use of oral hypoglycemic drugs: Secondary | ICD-10-CM | POA: Insufficient documentation

## 2018-09-24 DIAGNOSIS — I10 Essential (primary) hypertension: Secondary | ICD-10-CM | POA: Insufficient documentation

## 2018-09-24 DIAGNOSIS — R195 Other fecal abnormalities: Secondary | ICD-10-CM | POA: Diagnosis not present

## 2018-09-24 HISTORY — PX: COLONOSCOPY WITH PROPOFOL: SHX5780

## 2018-09-24 HISTORY — DX: Essential (primary) hypertension: I10

## 2018-09-24 HISTORY — PX: POLYPECTOMY: SHX5525

## 2018-09-24 HISTORY — DX: Other pulmonary embolism without acute cor pulmonale: I26.99

## 2018-09-24 LAB — GLUCOSE, CAPILLARY
Glucose-Capillary: 160 mg/dL — ABNORMAL HIGH (ref 70–99)
Glucose-Capillary: 162 mg/dL — ABNORMAL HIGH (ref 70–99)

## 2018-09-24 SURGERY — COLONOSCOPY WITH PROPOFOL
Anesthesia: General | Site: Rectum

## 2018-09-24 MED ORDER — OXYCODONE HCL 5 MG/5ML PO SOLN: 5.0000 mg | Freq: Once | ORAL | Status: DC | PRN

## 2018-09-24 MED ORDER — LIDOCAINE HCL (CARDIAC) PF 100 MG/5ML IV SOSY
PREFILLED_SYRINGE | INTRAVENOUS | Status: DC | PRN
  Administered 2018-09-24: 40 mg via INTRAVENOUS

## 2018-09-24 MED ORDER — OXYCODONE HCL 5 MG PO TABS: 5.0000 mg | ORAL_TABLET | Freq: Once | ORAL | Status: DC | PRN

## 2018-09-24 MED ORDER — LACTATED RINGERS IV SOLN
INTRAVENOUS | Status: DC
  Administered 2018-09-24: 07:00:00 via INTRAVENOUS

## 2018-09-24 MED ORDER — PROPOFOL 10 MG/ML IV BOLUS
INTRAVENOUS | Status: DC | PRN
  Administered 2018-09-24 (×2): 20 mg via INTRAVENOUS
  Administered 2018-09-24: 70 mg via INTRAVENOUS
  Administered 2018-09-24: 20 mg via INTRAVENOUS

## 2018-09-24 MED ORDER — STERILE WATER FOR IRRIGATION IR SOLN
Status: DC | PRN
  Administered 2018-09-24: 08:00:00

## 2018-09-24 SURGICAL SUPPLY — 6 items
CANISTER SUCT 1200ML W/VALVE (MISCELLANEOUS) ×3 IMPLANT
FORCEPS BIOP RAD 4 LRG CAP 4 (CUTTING FORCEPS) ×3 IMPLANT
GOWN CVR UNV OPN BCK APRN NK (MISCELLANEOUS) ×2 IMPLANT
GOWN ISOL THUMB LOOP REG UNIV (MISCELLANEOUS) ×4
KIT ENDO PROCEDURE OLY (KITS) ×3 IMPLANT
WATER STERILE IRR 250ML POUR (IV SOLUTION) ×3 IMPLANT

## 2018-09-24 NOTE — Op Note (Signed)
[facility] Patient Name: Jeanette Spencer Procedure Date: 09/24/2018 7:19 AM MRN: [id number] Account #: [id number] Date of Birth: Sep 26, 1951 Admit Type: Outpatient Age: 67 Room: [facility] OR ROOM 01 Gender: Female Note Status: Finalized Procedure:            Colonoscopy Indications:          Positive Cologuard test Providers:            [name] MD, MD Referring MD:         Deborra Medina, MD (Referring MD) Medicines:            Propofol per Anesthesia Complications:        No immediate complications. Procedure:            Pre-Anesthesia Assessment:                       - Prior to the procedure, a History and Physical was                        performed, and patient medications and allergies were                        reviewed. The patient's tolerance of previous                        anesthesia was also reviewed. The risks and benefits of                        the procedure and the sedation options and risks were                        discussed with the patient. All questions were                        answered, and informed consent was obtained. Prior                        Anticoagulants: The patient has taken no previous                        anticoagulant or antiplatelet agents. ASA Grade                        Assessment: II - A patient with mild systemic disease.                        After reviewing the risks and benefits, the patient was                        deemed in satisfactory condition to undergo the                        procedure.                       After obtaining informed consent, the colonoscope was                        passed under direct vision. Throughout the procedure,  the patient's blood pressure, pulse, and oxygen                        saturations were monitored continuously. The                        Colonoscope was introduced through the anus and                        advanced to  the the cecum, identified by appendiceal                        orifice and ileocecal valve. The colonoscopy was                        performed without difficulty. The patient tolerated the                        procedure well. The quality of the bowel preparation                        was excellent. Findings:      The perianal and digital rectal examinations were normal.      A 4 mm polyp was found in the sigmoid colon. The polyp was sessile. The       polyp was removed with a cold biopsy forceps. Resection and retrieval       were complete.      Multiple small-mouthed diverticula were found in the sigmoid colon.      Non-bleeding internal hemorrhoids were found during retroflexion. The       hemorrhoids were Grade I (internal hemorrhoids that do not prolapse).      Thickened IC valve, Biopsies were taken with a cold forceps for       histology. Impression:           - One 4 mm polyp in the sigmoid colon, removed with a                        cold biopsy forceps. Resected and retrieved.                       - Diverticulosis in the sigmoid colon.                       - Non-bleeding internal hemorrhoids. Recommendation:       - Discharge patient to home.                       - Resume previous diet.                       - Continue present medications.                       - Await pathology results.                       - Repeat colonoscopy in 5 years if polyp adenoma and 10                        years if hyperplastic Procedure Code(s):    --- Professional ---  45380, Colonoscopy, flexible; with biopsy, single or                        multiple Diagnosis Code(s):    --- Professional ---                       R19.5, Other fecal abnormalities                       D12.5, Benign neoplasm of sigmoid colon CPT copyright 2018 American Medical Association. All rights reserved. The codes documented in this report are preliminary and upon coder review may  be  revised to meet current compliance requirements. [name] MD, MD 09/24/2018 8:08:37 AM This report has been signed electronically. Number of Addenda: 0 Note Initiated On: 09/24/2018 7:19 AM Scope Withdrawal Time: 0 hours 7 minutes 37 seconds  Total Procedure Duration: 0 hours 11 minutes 2 seconds       [facility]

## 2018-09-24 NOTE — Anesthesia Preprocedure Evaluation (Signed)
Anesthesia Evaluation  Patient identified by MRN, date of birth, ID band  Reviewed: NPO status   History of Anesthesia Complications Negative for: history of anesthetic complications  Airway Mallampati: II  TM Distance: >3 FB Neck ROM: full    Dental no notable dental hx.    Pulmonary PE (1990) Chronic bronchitis during winters   Pulmonary exam normal        Cardiovascular Exercise Tolerance: Good hypertension, Normal cardiovascular exam     Neuro/Psych negative neurological ROS  negative psych ROS   GI/Hepatic negative GI ROS, Neg liver ROS,   Endo/Other  diabetesMorbid obesity (bmi 36)  Renal/GU negative Renal ROS  negative genitourinary   Musculoskeletal  (+) Arthritis ,   Abdominal   Peds  Hematology negative hematology ROS (+)   Anesthesia Other Findings   Reproductive/Obstetrics                             Anesthesia Physical Anesthesia Plan  ASA: II  Anesthesia Plan: General   Post-op Pain Management:    Induction:   PONV Risk Score and Plan:   Airway Management Planned: Natural Airway  Additional Equipment:   Intra-op Plan:   Post-operative Plan:   Informed Consent: I have reviewed the patients History and Physical, chart, labs and discussed the procedure including the risks, benefits and alternatives for the proposed anesthesia with the patient or authorized representative who has indicated his/her understanding and acceptance.     Plan Discussed with: CRNA  Anesthesia Plan Comments:         Anesthesia Quick Evaluation

## 2018-09-24 NOTE — Anesthesia Procedure Notes (Signed)
Performed by: Briyana Badman, CRNA Pre-anesthesia Checklist: Patient identified, Emergency Drugs available, Suction available, Timeout performed and Patient being monitored Patient Re-evaluated:Patient Re-evaluated prior to induction Oxygen Delivery Method: Nasal cannula Placement Confirmation: positive ETCO2       

## 2018-09-24 NOTE — Anesthesia Postprocedure Evaluation (Signed)
Anesthesia Post Note  Patient: Jeanette Spencer  Procedure(s) Performed: COLONOSCOPY WITH BIOPSY (N/A Rectum) POLYPECTOMY (N/A Rectum)  Patient location during evaluation: PACU Anesthesia Type: General Level of consciousness: awake and alert Pain management: pain level controlled Vital Signs Assessment: post-procedure vital signs reviewed and stable Respiratory status: spontaneous breathing, nonlabored ventilation, respiratory function stable and patient connected to nasal cannula oxygen Cardiovascular status: blood pressure returned to baseline and stable Postop Assessment: no apparent nausea or vomiting Anesthetic complications: no    Josefa [place]

## 2018-09-24 NOTE — H&P (Signed)
Jeanette Lame, MD [address] [place], [place] [address] Phone:[phone] Fax : [phone]  Primary Care Physician:  [name], MD Primary Gastroenterologist:  Dr. Allen Norris  Pre-Procedure History & Physical: HPI:  Jeanette Spencer is a 67 y.o. female is here for an colonoscopy.   Past Medical History:  Diagnosis Date  . Allergy   . Arthritis    knees  . Chicken pox   . Chronic bronchitis ([place])   . Diabetes mellitus without complication ([place])    type 2  . Hypertension   . Phlebitis   . Pulmonary embolism ([place]) 1990   after abdominal tumor removal    Past Surgical History:  Procedure Laterality Date  . ABDOMINAL HYSTERECTOMY    . abdominal tumor Left    size of grapefruit  . TONSILECTOMY, ADENOIDECTOMY, BILATERAL MYRINGOTOMY AND TUBES      Prior to Admission medications   Medication Sig Start Date End Date Taking? Authorizing Provider  acyclovir (ZOVIRAX) 400 MG tablet Take 1 tablet (400 mg total) by mouth 5 (five) times daily. 09/11/18  Yes [name], MD  atorvastatin (LIPITOR) 20 MG tablet Take 1 tablet (20 mg total) by mouth daily. 04/05/18  Yes [name], MD  COD LIVER OIL PO Take by mouth as needed.   Yes [provider]  glipiZIDE (GLUCOTROL) 5 MG tablet Take 1 tablet (5 mg total) by mouth 2 (two) times daily before a meal. 08/06/18  Yes [name], MD  glucose blood test strip Use as instructed to check sugars twice daily.  Uncontrolled diabetes mellitus E11.65 09/07/15  Yes [name], MD  lisinopril-hydrochlorothiazide (PRINZIDE,ZESTORETIC) 20-25 MG tablet Take 1 tablet by mouth daily. 08/09/18  Yes [name], MD  metFORMIN (GLUCOPHAGE) 850 MG tablet TAKE 1 TABLET BY MOUTH TWICE DAILY WITH MEALS 07/30/18  Yes [name], MD  Multiple Vitamin (MULTIVITAMIN) tablet Take 1 tablet by mouth daily.   Yes [provider]  acyclovir ointment (ZOVIRAX) 5 % Apply 1 application topically every 3 (three)  hours. Patient not taking: Reported on 09/12/2018 09/11/18   [name], MD    Allergies as of 09/07/2018 - Review Complete 08/06/2018  Allergen Reaction Noted  . Lipitor [atorvastatin]  05/23/2013    Family History  Problem Relation Age of Onset  . Diabetes Mother   . Cancer Father   . Cancer Paternal Aunt   . Cancer Paternal Uncle   . Diabetes Paternal Grandmother     Social History   Socioeconomic History  . Marital status: Single    Spouse name: Not on file  . Number of children: Not on file  . Years of education: Not on file  . Highest education level: Not on file  Occupational History  . Not on file  Social Needs  . Financial resource strain: Not hard at all  . Food insecurity:    Worry: Never true    Inability: Never true  . Transportation needs:    Medical: No    Non-medical: No  Tobacco Use  . Smoking status: Never Smoker  . Smokeless tobacco: Never Used  Substance and Sexual Activity  . Alcohol use: Yes    Comment:  rarely - Holidays  . Drug use: No  . Sexual activity: Never  Lifestyle  . Physical activity:    Days per week: Not on file    Minutes per session: Not on file  . Stress: Only a little  Relationships  . Social connections:    Talks on phone: Not on file    Gets together: Not on file    Attends religious service: Not on file    Active member of club or organization: Not on file    Attends meetings of clubs or organizations: Not on file    Relationship status: Divorced  . Intimate partner violence:    Fear of current or ex partner: Not on file    Emotionally abused: Not on file    Physically abused: Not on file    Forced sexual activity: Not on file  Other Topics Concern  . Not on file  Social History Narrative  . Not on file    Review of Systems: See HPI, otherwise negative ROS  Physical Exam: BP 134/66   Pulse 91   Temp (!) 97.3 F (36.3 C) (Temporal)   Resp 15   Ht 4' 11.75" (1.518 m)   Wt 83.9 kg   SpO2 99%    BMI 36.43 kg/m  General:   Alert,  pleasant and cooperative in NAD Head:  Normocephalic and atraumatic. Neck:  Supple; no masses or thyromegaly. Lungs:  Clear throughout to auscultation.    Heart:  Regular rate and rhythm. Abdomen:  Soft, nontender and nondistended. Normal bowel sounds, without guarding, and without rebound.   Neurologic:  Alert and  oriented x4;  grossly normal neurologically.  Impression/Plan: [name] is here for an colonoscopy to be performed for positive cologuard  Risks, benefits, limitations, and alternatives regarding  colonoscopy have been reviewed with the patient.  Questions have been answered.  All parties agreeable.   Jeanette Lame, MD  09/24/2018, 7:44 AM

## 2018-09-24 NOTE — Transfer of Care (Signed)
Immediate Anesthesia Transfer of Care Note  Patient: Jeanette Spencer  Procedure(s) Performed: COLONOSCOPY WITH BIOPSY (N/A Rectum)  Patient Location: PACU  Anesthesia Type: General  Level of Consciousness: awake, alert  and patient cooperative  Airway and Oxygen Therapy: Patient Spontanous Breathing and Patient connected to supplemental oxygen  Post-op Assessment: Post-op Vital signs reviewed, Patient's Cardiovascular Status Stable, Respiratory Function Stable, Patent Airway and No signs of Nausea or vomiting  Post-op Vital Signs: Reviewed and stable  Complications: No apparent anesthesia complications

## 2018-09-25 ENCOUNTER — Encounter: Payer: Self-pay | Admitting: Gastroenterology

## 2018-09-27 ENCOUNTER — Encounter: Payer: Self-pay | Admitting: Gastroenterology

## 2018-11-09 ENCOUNTER — Ambulatory Visit (INDEPENDENT_AMBULATORY_CARE_PROVIDER_SITE_OTHER): Payer: Medicare Other

## 2018-11-09 ENCOUNTER — Encounter: Payer: Self-pay | Admitting: Internal Medicine

## 2018-11-09 ENCOUNTER — Ambulatory Visit (INDEPENDENT_AMBULATORY_CARE_PROVIDER_SITE_OTHER): Payer: Medicare Other | Admitting: Internal Medicine

## 2018-11-09 VITALS — BP 122/78 | HR 92 | Temp 98.1°F | Resp 15 | Ht 59.75 in | Wt 185.4 lb

## 2018-11-09 DIAGNOSIS — R109 Unspecified abdominal pain: Secondary | ICD-10-CM

## 2018-11-09 DIAGNOSIS — E1169 Type 2 diabetes mellitus with other specified complication: Secondary | ICD-10-CM | POA: Diagnosis not present

## 2018-11-09 DIAGNOSIS — G8929 Other chronic pain: Secondary | ICD-10-CM

## 2018-11-09 DIAGNOSIS — E1121 Type 2 diabetes mellitus with diabetic nephropathy: Secondary | ICD-10-CM

## 2018-11-09 DIAGNOSIS — E785 Hyperlipidemia, unspecified: Secondary | ICD-10-CM | POA: Diagnosis not present

## 2018-11-09 DIAGNOSIS — M47816 Spondylosis without myelopathy or radiculopathy, lumbar region: Secondary | ICD-10-CM | POA: Diagnosis not present

## 2018-11-09 DIAGNOSIS — E119 Type 2 diabetes mellitus without complications: Secondary | ICD-10-CM | POA: Diagnosis not present

## 2018-11-09 LAB — COMPREHENSIVE METABOLIC PANEL
ALT: 11 U/L (ref 0–35)
AST: 15 U/L (ref 0–37)
Albumin: 4.2 g/dL (ref 3.5–5.2)
Alkaline Phosphatase: 57 U/L (ref 39–117)
BUN: 26 mg/dL — ABNORMAL HIGH (ref 6–23)
CO2: 28 mEq/L (ref 19–32)
Calcium: 9.7 mg/dL (ref 8.4–10.5)
Chloride: 101 mEq/L (ref 96–112)
Creatinine, Ser: 1.08 mg/dL (ref 0.40–1.20)
GFR: 60.86 mL/min (ref >60.00)
Glucose, Bld: 148 mg/dL — ABNORMAL HIGH (ref 70–99)
Potassium: 3.8 mEq/L (ref 3.5–5.1)
Sodium: 139 mEq/L (ref 135–145)
TOTAL PROTEIN: 7.6 g/dL (ref 6.0–8.3)
Total Bilirubin: 0.4 mg/dL (ref 0.2–1.2)

## 2018-11-09 LAB — HEMOGLOBIN A1C: Hgb A1c MFr Bld: 8.1 % — ABNORMAL HIGH (ref 4.6–6.5)

## 2018-11-09 LAB — LIPID PANEL
Cholesterol: 169 mg/dL (ref 0–200)
HDL: 51.3 mg/dL (ref >39.00)
LDL Cholesterol: 86 mg/dL (ref 0–99)
NonHDL: 117.67
Total CHOL/HDL Ratio: 3
Triglycerides: 160 mg/dL — ABNORMAL HIGH (ref 0.0–149.0)
VLDL: 32 mg/dL (ref 0.0–40.0)

## 2018-11-09 LAB — URINALYSIS, MICROSCOPIC ONLY

## 2018-11-09 LAB — POCT URINALYSIS DIPSTICK
Bilirubin, UA: NEGATIVE
Glucose, UA: NEGATIVE
Ketones, UA: NEGATIVE
LEUKOCYTES UA: NEGATIVE
Nitrite, UA: NEGATIVE
Protein, UA: NEGATIVE
Spec Grav, UA: >=1.03 — AB (ref 1.010–1.025)
Urobilinogen, UA: 0.2 E.U./dL
pH, UA: 5.5 (ref 5.0–8.0)

## 2018-11-09 MED ORDER — PREDNISONE 10 MG PO TABS: ORAL_TABLET | ORAL | 0 refills | Status: DC

## 2018-11-09 NOTE — Progress Notes (Signed)
Subjective:  Patient ID: Jeanette Spencer, female    DOB: 09-25-1951  Age: 68 y.o. MRN: [id number]  CC: The primary encounter diagnosis was Chronic left flank pain. Diagnoses of Hyperlipidemia associated with type 2 diabetes mellitus ([place]), Type 2 diabetes mellitus with nephropathy ([place]), and Acute left flank pain were also pertinent to this visit.  HPI Jeanette Spencer presents for DIABETES FOLLOW UP. Multiple complaints.   CC:  RECURRENT AND PERSISTENT  leflt flank  pain started Dec 24th  During an acute GI illness.  Had one day of recurrent emesis  On dec 25.  Improved with tylenol  But pain returned,  Described as throbbing or burning, lasting all day . Also wakes up with hamstring tightness every day,  Lifts.  a 20 lb granddaughter daily during care but this does not aggravate the flank pain . Not sharp like a kidney stone   PERSISTENT SINUS DRAINAGE,  Causing a dry cough  At night mostly   Since last visit had a colonoscopy on dec 30th : hyperplastic polyp  2) right lateral foot pain , intermittent burning quality occurring at night   3)  She does not feel  well, is not exercising but checks blood sugars once daily at variable times.  BS have been under 130 fasting and < 160 post prandially.  Denies any recent hypoglyemic events.  Taking her medications as directed. Following a carbohydrate modified diet 6 days per week.  Appetite is good.     Outpatient Medications Prior to Visit  Medication Sig Dispense Refill  . acyclovir (ZOVIRAX) 400 MG tablet Take 1 tablet (400 mg total) by mouth 5 (five) times daily. 35 tablet 1  . atorvastatin (LIPITOR) 20 MG tablet Take 1 tablet (20 mg total) by mouth daily. 90 tablet 3  . COD LIVER OIL PO Take by mouth as needed.    [name] glipiZIDE (GLUCOTROL) 5 MG tablet Take 1 tablet (5 mg total) by mouth 2 (two) times daily before a meal. 180 tablet 3  . glucose blood test strip Use as instructed to check sugars twice daily.  Uncontrolled diabetes mellitus  E11.65 100 each 12  . lisinopril-hydrochlorothiazide (PRINZIDE,ZESTORETIC) 20-25 MG tablet Take 1 tablet by mouth daily. 90 tablet 0  . metFORMIN (GLUCOPHAGE) 850 MG tablet TAKE 1 TABLET BY MOUTH TWICE DAILY WITH MEALS 180 tablet 2  . Multiple Vitamin (MULTIVITAMIN) tablet Take 1 tablet by mouth daily.    [name] acyclovir ointment (ZOVIRAX) 5 % Apply 1 application topically every 3 (three) hours. (Patient not taking: Reported on 09/12/2018) 15 g 1   No facility-administered medications prior to visit.     Review of Systems;  Patient denies headache, fevers, malaise, unintentional weight loss, skin rash, eye pain, sinus congestion and sinus pain, sore throat, dysphagia,  hemoptysis , cough, dyspnea, wheezing, chest pain, palpitations, orthopnea, edema, abdominal pain, nausea, melena, diarrhea, constipation, flank pain, dysuria, hematuria, urinary  Frequency, nocturia, numbness, tingling, seizures,  Focal weakness, Loss of consciousness,  Tremor, insomnia, depression, anxiety, and suicidal ideation.      Objective:  BP 122/78 (BP Location: Left Arm, Patient Position: Sitting, Cuff Size: Normal)   Pulse 92   Temp 98.1 F (36.7 C) (Oral)   Resp 15   Ht 4' 11.75" (1.518 m)   Wt 185 lb 6.4 oz (84.1 kg)   SpO2 96%   BMI 36.51 kg/m   BP Readings from Last 3 Encounters:  11/09/18 122/78  09/24/18 126/70  08/06/18 (!) 142/84  Wt Readings from Last 3 Encounters:  11/09/18 185 lb 6.4 oz (84.1 kg)  09/24/18 185 lb (83.9 kg)  08/06/18 192 lb 12.8 oz (87.5 kg)    General appearance: alert, cooperative and appears stated age Ears: normal TM's and external ear canals both ears Throat: lips, mucosa, and tongue normal; teeth and gums normal Neck: no adenopathy, no carotid bruit, supple, symmetrical, trachea midline and thyroid not enlarged, symmetric, no tenderness/mass/nodules Back: symmetric, no curvature. ROM normal. No CVA tenderness. Lungs: clear to auscultation bilaterally Heart: regular  rate and rhythm, S1, S2 normal, no murmur, click, rub or gallop Abdomen: soft, non-tender; bowel sounds normal; no masses,  no organomegaly Pulses: 2+ and symmetric Skin: Skin color, texture, turgor normal. No rashes or lesions Lymph nodes: Cervical, supraclavicular, and axillary nodes normal.  Lab Results  Component Value Date   HGBA1C 8.1 (H) 11/09/2018   HGBA1C 6.9 (A) 08/06/2018   HGBA1C 7.0 (A) 04/05/2018    Lab Results  Component Value Date   CREATININE 1.08 11/09/2018   CREATININE 0.83 08/06/2018   CREATININE 0.90 03/28/2018    Lab Results  Component Value Date   WBC 8.9 04/11/2013   HGB 12.5 04/11/2013   HCT 37.9 04/11/2013   PLT 268.0 04/11/2013   GLUCOSE 148 (H) 11/09/2018   CHOL 169 11/09/2018   TRIG 160.0 (H) 11/09/2018   HDL 51.30 11/09/2018   LDLDIRECT 172.0 09/27/2017   LDLCALC 86 11/09/2018   ALT 11 11/09/2018   AST 15 11/09/2018   NA 139 11/09/2018   K 3.8 11/09/2018   CL 101 11/09/2018   CREATININE 1.08 11/09/2018   BUN 26 (H) 11/09/2018   CO2 28 11/09/2018   TSH 1.37 09/27/2017   HGBA1C 8.1 (H) 11/09/2018   MICROALBUR 5.0 (H) 08/06/2018    No results found.  Assessment & Plan:   Problem List Items Addressed This Visit    Type 2 diabetes mellitus with nephropathy ([place])    Loss of control by A1c today despite patient's reports of good blood sugars .  Will increase metformin to 100 mg bid and glipizide to 10 mg bid       Relevant Orders   Hemoglobin A1c (Completed)   Comprehensive metabolic panel (Completed)   Hyperlipidemia associated with type 2 diabetes mellitus ([place])   Relevant Orders   Lipid panel (Completed)   Acute left flank pain    subacute,  Present since Dec 24 (7 weeks)   dx includes stone (less likely) vs muscle strain (more likely) based on exam.  .Urinalysis suggests a UTI, but her exam is not consistent with pyelonephritis. Plain films note degenerative changes and retrolisthesis from L2 through L5   Will treat with  prednisone taper and  await culture        Other Visit Diagnoses    Chronic left flank pain    -  Primary   Relevant Medications   predniSONE (DELTASONE) 10 MG tablet   Other Relevant Orders   POCT urinalysis dipstick (Completed)   Urine Microscopic Only (Completed)   Urine Culture   DG Lumbar Spine Complete (Completed)      I have discontinued Jeanette Spencer's acyclovir ointment. I am also having her start on predniSONE. Additionally, I am having her maintain her glucose blood, atorvastatin, metFORMIN, glipiZIDE, lisinopril-hydrochlorothiazide, acyclovir, multivitamin, and COD LIVER OIL PO.  Meds ordered this encounter  Medications  . predniSONE (DELTASONE) 10 MG tablet    Sig: 6 tablets on Day 1 , then reduce by  1 tablet daily until gone    Dispense:  21 tablet    Refill:  0    Medications Discontinued During This Encounter  Medication Reason  . acyclovir ointment (ZOVIRAX) 5 % Patient has not taken in last 30 days    Follow-up: Return in about 3 months (around 02/07/2019) for follow up diabetes.   [name], MD

## 2018-11-09 NOTE — Patient Instructions (Addendum)
You can use colace and miralax /citrucel/metamucil/benefiber  together on a daily basis EVERY NIGHT IF NEEDED TO MAINTAIN REGULARITY  Of bowel movememnts  Your flank plain may be coming from your back  ,  But we will check for infection as well   I am sending a prednisone taper pack to your pharmacy to see if it improves your flank pain.  Do not start it unless I tell you that that your urine is negative for infection

## 2018-11-10 DIAGNOSIS — R109 Unspecified abdominal pain: Secondary | ICD-10-CM | POA: Insufficient documentation

## 2018-11-10 MED ORDER — GLIPIZIDE 10 MG PO TABS: 10.0000 mg | ORAL_TABLET | Freq: Two times a day (BID) | ORAL | 3 refills | Status: DC

## 2018-11-10 MED ORDER — SULFAMETHOXAZOLE-TRIMETHOPRIM 800-160 MG PO TABS: 1.0000 | ORAL_TABLET | Freq: Two times a day (BID) | ORAL | 0 refills | Status: DC

## 2018-11-10 MED ORDER — METFORMIN HCL 1000 MG PO TABS: 1000.0000 mg | ORAL_TABLET | Freq: Two times a day (BID) | ORAL | 2 refills | Status: DC

## 2018-11-10 NOTE — Assessment & Plan Note (Signed)
Loss of control by A1c today despite patient's reports of good blood sugars .  Will increase metformin to 100 mg bid and glipizide to 10 mg bid

## 2018-11-10 NOTE — Assessment & Plan Note (Addendum)
subacute,  Present since Dec 24 (7 weeks)   dx includes stone (less likely) vs muscle strain (more likely) based on exam.  .Urinalysis suggests a UTI, but her exam is not consistent with pyelonephritis. Plain films note degenerative changes and retrolisthesis from L2 through L5   Will treat with prednisone taper and  await culture

## 2018-11-10 NOTE — Assessment & Plan Note (Addendum)
Tolerating  Lipitor, restarted in July due to  10 yr risk of CAD using FRC as 25% .She did not tolerate co q 10  Lab Results  Component Value Date   CHOL 169 11/09/2018   HDL 51.30 11/09/2018   LDLCALC 86 11/09/2018   LDLDIRECT 172.0 09/27/2017   TRIG 160.0 (H) 11/09/2018   CHOLHDL 3 11/09/2018   Lab Results  Component Value Date   ALT 11 11/09/2018   AST 15 11/09/2018   ALKPHOS 57 11/09/2018   BILITOT 0.4 11/09/2018

## 2018-11-11 LAB — URINE CULTURE
MICRO NUMBER:: 197261
SPECIMEN QUALITY:: ADEQUATE

## 2018-11-12 ENCOUNTER — Other Ambulatory Visit: Payer: Self-pay

## 2018-11-12 MED ORDER — LISINOPRIL-HYDROCHLOROTHIAZIDE 20-25 MG PO TABS: 1.0000 | ORAL_TABLET | Freq: Every day | ORAL | 1 refills | Status: DC

## 2018-11-14 ENCOUNTER — Telehealth: Payer: Self-pay | Admitting: Internal Medicine

## 2018-11-14 NOTE — Telephone Encounter (Signed)
Patient returning call for lab results. NOD currently unavailable. Would like a call back after 3:30pm today, if possible.  Copied from [place] [phone]. Topic: Quick Communication - Lab Results (Clinic Use ONLY) >> Nov 14, 2018  2:07 PM Gordy Councilman, CMA wrote: Called patient to inform them of 11/09/2018 lab results. When patient returns call, triage nurse may disclose results.  Gae Bon, CMA

## 2018-12-25 DIAGNOSIS — R05 Cough: Secondary | ICD-10-CM | POA: Diagnosis not present

## 2018-12-25 DIAGNOSIS — J0140 Acute pansinusitis, unspecified: Secondary | ICD-10-CM | POA: Diagnosis not present

## 2019-02-06 DIAGNOSIS — R05 Cough: Secondary | ICD-10-CM | POA: Diagnosis not present

## 2019-02-07 ENCOUNTER — Other Ambulatory Visit: Payer: Self-pay

## 2019-02-07 ENCOUNTER — Ambulatory Visit (INDEPENDENT_AMBULATORY_CARE_PROVIDER_SITE_OTHER): Payer: Medicare Other | Admitting: Internal Medicine

## 2019-02-07 VITALS — Wt 182.0 lb

## 2019-02-07 DIAGNOSIS — E1169 Type 2 diabetes mellitus with other specified complication: Secondary | ICD-10-CM | POA: Diagnosis not present

## 2019-02-07 DIAGNOSIS — I10 Essential (primary) hypertension: Secondary | ICD-10-CM | POA: Diagnosis not present

## 2019-02-07 DIAGNOSIS — E1121 Type 2 diabetes mellitus with diabetic nephropathy: Secondary | ICD-10-CM

## 2019-02-07 DIAGNOSIS — E785 Hyperlipidemia, unspecified: Secondary | ICD-10-CM

## 2019-02-07 DIAGNOSIS — R059 Cough, unspecified: Secondary | ICD-10-CM

## 2019-02-07 DIAGNOSIS — R05 Cough: Secondary | ICD-10-CM

## 2019-02-07 DIAGNOSIS — Z7189 Other specified counseling: Secondary | ICD-10-CM | POA: Diagnosis not present

## 2019-02-07 NOTE — Progress Notes (Signed)
Telephone Note  This visit type was conducted due to national recommendations for restrictions regarding the COVID-19 pandemic (e.g. social distancing).  This format is felt to be most appropriate for this patient at this time.  All issues noted in this document were discussed and addressed.  No physical exam was performed (except for noted visual exam findings with Video Visits).   I connected with@ on 02/07/19 at 10:30 AM EDT by a video enabled telemedicine application or telephone and verified that I am speaking with the correct person using two identifiers. Location patient: home Location provider: work or home office Persons participating in the virtual visit: patient, provider  I discussed the limitations, risks, security and privacy concerns of performing an evaluation and management service by telephone and the availability of in person appointments. I also discussed with the patient that there may be a patient responsible charge related to this service. The patient expressed understanding and agreed to proceed.  Reason for visit: type 2  DM follow up  HPI:  3 month follow up on diabetes.  Patient has no complaints today.  Patient is following a low glycemic index diet about 60% of the time and has reduced her metformin to 850 mg twice daily and  Glipizide to 5 mg morning only,  due to  Recurrent hypoglycemia .  Fasting sugars have been less than 140 most of the time and post prandials have been under 160 except on rare occasions. Patient is walking about 3 times per week and intentionally trying to lose weight .  Patient has had an eye exam in the last 12 months and checks feet regularly for signs of infection.  Patient does not walk barefoot outside,  And denies an numbness tingling or burning in feet. Patient is up to date on all recommended vaccinations  Persistent cough : she was treated several weeks ago for sinusitis but continues to have a persistent cough , accompanied by sneezing  and  rhinitis.  The cough is worse at night .  She has received  a prescription for a cough syrup with codeine which is helping .  Marland KitchenHypertension: patient checks blood pressure twice weekly at home.  Readings have been for the most part < 140/80 at rest . Patient is following a reduced salt diet most days and is taking medications as prescribed.  The patient has no signs or symptoms of COVID 19 infection (fever, cough, sore throat  or shortness of breath beyond what is typical for patient).  Patient denies contact with other persons with the above mentioned symptoms or with anyone confirmed to have COVID 19    ROS: See pertinent positives and negatives per HPI.  Past Medical History:  Diagnosis Date  . Allergy   . Arthritis    knees  . Chicken pox   . Chronic bronchitis ([place])   . Diabetes mellitus without complication ([place])    type 2  . Hypertension   . Phlebitis   . Pulmonary embolism ([place]) 1990   after abdominal tumor removal    Past Surgical History:  Procedure Laterality Date  . ABDOMINAL HYSTERECTOMY    . abdominal tumor Left    size of grapefruit  . COLONOSCOPY WITH PROPOFOL N/A 09/24/2018   Procedure: COLONOSCOPY WITH BIOPSY;  Surgeon: [name], MD;  Location: [facility];  Service: Endoscopy;  Laterality: N/A;  diabetic - oral meds  requests arrival after 8:30  . POLYPECTOMY N/A 09/24/2018   Procedure: POLYPECTOMY;  Surgeon: [name], MD;  Location: [facility];  Service: Endoscopy;  Laterality: N/A;  . TONSILECTOMY, ADENOIDECTOMY, BILATERAL MYRINGOTOMY AND TUBES      Family History  Problem Relation Age of Onset  . Diabetes Mother   . Cancer Father   . Cancer Paternal Aunt   . Cancer Paternal Uncle   . Diabetes Paternal Grandmother     SOCIAL HX: married, retired Public house manager    Current Outpatient Medications:  .  acyclovir (ZOVIRAX) 400 MG tablet, Take 1 tablet (400 mg total) by mouth 5 (five) times daily., Disp: 35 tablet, Rfl:  1 .  atorvastatin (LIPITOR) 20 MG tablet, Take 1 tablet (20 mg total) by mouth daily., Disp: 90 tablet, Rfl: 3 .  glipiZIDE (GLUCOTROL) 10 MG tablet, Take 1 tablet (10 mg total) by mouth 2 (two) times daily before a meal. (Patient taking differently: Take 5 mg by mouth daily before breakfast. ), Disp: 180 tablet, Rfl: 3 .  glucose blood test strip, Use as instructed to check sugars twice daily.  Uncontrolled diabetes mellitus E11.65, Disp: 100 each, Rfl: 12 .  metFORMIN (GLUCOPHAGE) 1000 MG tablet, Take 1 tablet (1,000 mg total) by mouth 2 (two) times daily with a meal. (Patient taking differently: Take 1,000 mg by mouth 2 (two) times daily with a meal. ), Disp: 180 tablet, Rfl: 2 .  Multiple Vitamin (MULTIVITAMIN) tablet, Take 1 tablet by mouth daily., Disp: , Rfl:  .  COD LIVER OIL PO, Take by mouth as needed., Disp: , Rfl:  .  telmisartan-hydrochlorothiazide (MICARDIS HCT) 80-25 MG tablet, Take 1 tablet by mouth daily. In the evening, Disp: 90 tablet, Rfl: 0  EXAM:   General impression: alert, cooperative and articulate.  No signs of being in distress  Lungs: speech is fluent sentence length suggests that patient is not short of breath and not punctuated by cough, sneezing or sniffing. [name]   Psych: affect normal.  speech is articulate and non pressured .  Denies suicidal thoughts   ASSESSMENT AND PLAN:  Discussed the following assessment and plan:  Cough - Plan: CBC with Differential/Platelet  Essential hypertension, benign  Hyperlipidemia associated with type 2 diabetes mellitus ([place]) - Plan: Lipid panel  Type 2 diabetes mellitus with nephropathy ([place]) - Plan: Hemoglobin A1c, Comprehensive metabolic panel  Educated About Covid-19 Virus Infection  Educated About Covid-19 Virus Infection Educated patient on the signs and symptoms of COVID-19 infection and ways to avoid the viral infection including washing hands frequently with soap and water,  using hand sanitizer if unable to wash,  avoiding touching face,  staying at home and limiting visitors,  and avoiding contact with people coming in and out of home.  Reminded patient to call office with questions/concerns.  The importance of social distancing was discussed today  Type 2 diabetes mellitus with nephropathy ([place]) Loss of control by A1c  At last visit  despite patient's reports of good blood sugars .  Did not tolerate the  increase metformin to 1000 mg bid and glipizide to 10 mg bid  Due to recurrent hypoglycemia.  Her diet has improved.  She is taking 5 mg glipizide in the am . And 850 mg bid  .  A1c is due.   Lab Results  Component Value Date   HGBA1C 8.1 (H) 11/09/2018     Essential hypertension, benign Well controlled on current regimen. Renal function has been stable.  I am making a decision to change patient's ACE Inhibitor to an ARB  based on increased reports  of  angioedema.  I also advised patient to to take it at night instead of morning,  as recent studies have shown a reduction in incidence of heart attacks and strokes.   Hyperlipidemia associated with type 2 diabetes mellitus Tolerating  Lipitor, restarted in July due to  Elevated 10 yr risk of CAD using FRC of   25% .She did not tolerate co q 10  Lab Results  Component Value Date   CHOL 169 11/09/2018   HDL 51.30 11/09/2018   LDLCALC 86 11/09/2018   LDLDIRECT 172.0 09/27/2017   TRIG 160.0 (H) 11/09/2018   CHOLHDL 3 11/09/2018   Lab Results  Component Value Date   ALT 11 11/09/2018   AST 15 11/09/2018   ALKPHOS 57 11/09/2018   BILITOT 0.4 11/09/2018     Cough Discussed the possible etiologies of persistent cough including but not limited to post nasal drip,  Allergic rhinitis,  Asthma,  GERD and chronic cyclic cough due to persistent irritation.  She is  taking an ACE Inhibitor.  Switching to ARB . Suggested treating for allergic rhinitis,  PND and GERD to break cycle and reevaluation in a few weeks.     I discussed the assessment and  treatment plan with the patient. The patient was provided an opportunity to ask questions and all were answered. The patient agreed with the plan and demonstrated an understanding of the instructions.   The patient was advised to call back or seek an in-person evaluation if the symptoms worsen or if the condition fails to improve as anticipated.  I provided 25 minutes of non-face-to-face time during this encounter.   [name], MD

## 2019-02-07 NOTE — Progress Notes (Signed)
LMTCB. Need to schedule pt for a fasting lab appt.

## 2019-02-07 NOTE — Patient Instructions (Addendum)
Your cough may be coming from reflux, allergies, or post nasal drip (PND).  PND and allergies can be treated with benadryl,  Allegra, Zyrtec and claritin. Benadryl is the most effective for drying you up but it is also the most sedating,  So try taking it at night and use one of the others in the daytime   Add Sudafed PE as needed for congestion ONLY   Consider using simply Saline to flush your sinuses twice daily when you have congestion to prevent sinus infections   For your allergies ,  You can use Benadryl but you should also consider adding one of these newer second generation antihistamines that are longer acting, non sedating and  available OTC:  Generic  Zyrtec, which is cetirizine.    generic Allegra , available generically as fexofenadine ; comes in 60 mg and 180 mg once daily strengths.    Generic Claritin :  also available as loratidine .   Lab Results  Component Value Date   MICROALBUR 5.0 (H) 08/06/2018

## 2019-02-07 NOTE — Progress Notes (Signed)
Pt stated that at the end of March she had went to Urgent Care for a sinus infection and was prescribed doxycycline and codeine cough syrup. Pt stated that she finished both of those medications and that cough and congestion has returned again. Pt also stated that she has had to cut both her metformin and glipizide. Pt stated that she is taking 850mg  BID of Metformin and taking the Glipizide 5mg  once daily. She stated that she had several lows in the 50s so that's why she cut it back. She stated that her sugars are running 128-150 fasting. This morning was 131.

## 2019-02-09 ENCOUNTER — Telehealth: Payer: Self-pay | Admitting: Internal Medicine

## 2019-02-09 DIAGNOSIS — R05 Cough: Secondary | ICD-10-CM | POA: Insufficient documentation

## 2019-02-09 DIAGNOSIS — R059 Cough, unspecified: Secondary | ICD-10-CM | POA: Insufficient documentation

## 2019-02-09 DIAGNOSIS — Z7189 Other specified counseling: Secondary | ICD-10-CM | POA: Insufficient documentation

## 2019-02-09 MED ORDER — TELMISARTAN-HCTZ 80-25 MG PO TABS: 1.0000 | ORAL_TABLET | Freq: Every day | ORAL | 0 refills | Status: DC

## 2019-02-09 NOTE — Assessment & Plan Note (Addendum)
Well controlled on current regimen. Renal function has been stable.  I am making a decision to change patient's ACE Inhibitor to an ARB  based on increased reports of  angioedema.  I also advised patient to to take it at night instead of morning,  as recent studies have shown a reduction in incidence of heart attacks and strokes.

## 2019-02-09 NOTE — Assessment & Plan Note (Signed)
Educated patient on the signs and symptoms of COVID-19 infection and ways to avoid the viral infection including washing hands frequently with soap and water,  using hand sanitizer if unable to wash, avoiding touching face,  staying at home and limiting visitors,  and avoiding contact with people coming in and out of home.  Reminded patient to call office with questions/concerns.  The importance of social distancing was discussed today 

## 2019-02-09 NOTE — Assessment & Plan Note (Signed)
Discussed the possible etiologies of persistent cough including but not limited to post nasal drip,  Allergic rhinitis,  Asthma,  GERD and chronic cyclic cough due to persistent irritation.  She is  taking an ACE Inhibitor.  Switching to ARB . Suggested treating for allergic rhinitis,  PND and GERD to break cycle and reevaluation in a few weeks.

## 2019-02-09 NOTE — Assessment & Plan Note (Signed)
Tolerating  Lipitor, restarted in July due to  Elevated 10 yr risk of CAD using FRC of   25% .She did not tolerate co q 10  Lab Results  Component Value Date   CHOL 169 11/09/2018   HDL 51.30 11/09/2018   LDLCALC 86 11/09/2018   LDLDIRECT 172.0 09/27/2017   TRIG 160.0 (H) 11/09/2018   CHOLHDL 3 11/09/2018   Lab Results  Component Value Date   ALT 11 11/09/2018   AST 15 11/09/2018   ALKPHOS 57 11/09/2018   BILITOT 0.4 11/09/2018

## 2019-02-09 NOTE — Assessment & Plan Note (Signed)
Loss of control by A1c  At last visit  despite patient's reports of good blood sugars .  Did not tolerate the  increase metformin to 1000 mg bid and glipizide to 10 mg bid  Due to recurrent hypoglycemia.  Her diet has improved.  She is taking 5 mg glipizide in the am . And 850 mg bid  .  A1c is due.   Lab Results  Component Value Date   HGBA1C 8.1 (H) 11/09/2018

## 2019-06-29 IMAGING — DX DG LUMBAR SPINE COMPLETE 4+V
5 series · 5 of 5 positions shown · non-contrast
Comparison: None.

CLINICAL DATA: Left low back pain.  No known injury.

EXAM:
LUMBAR SPINE - COMPLETE 4+ VIEW

[lumbar spine ap]
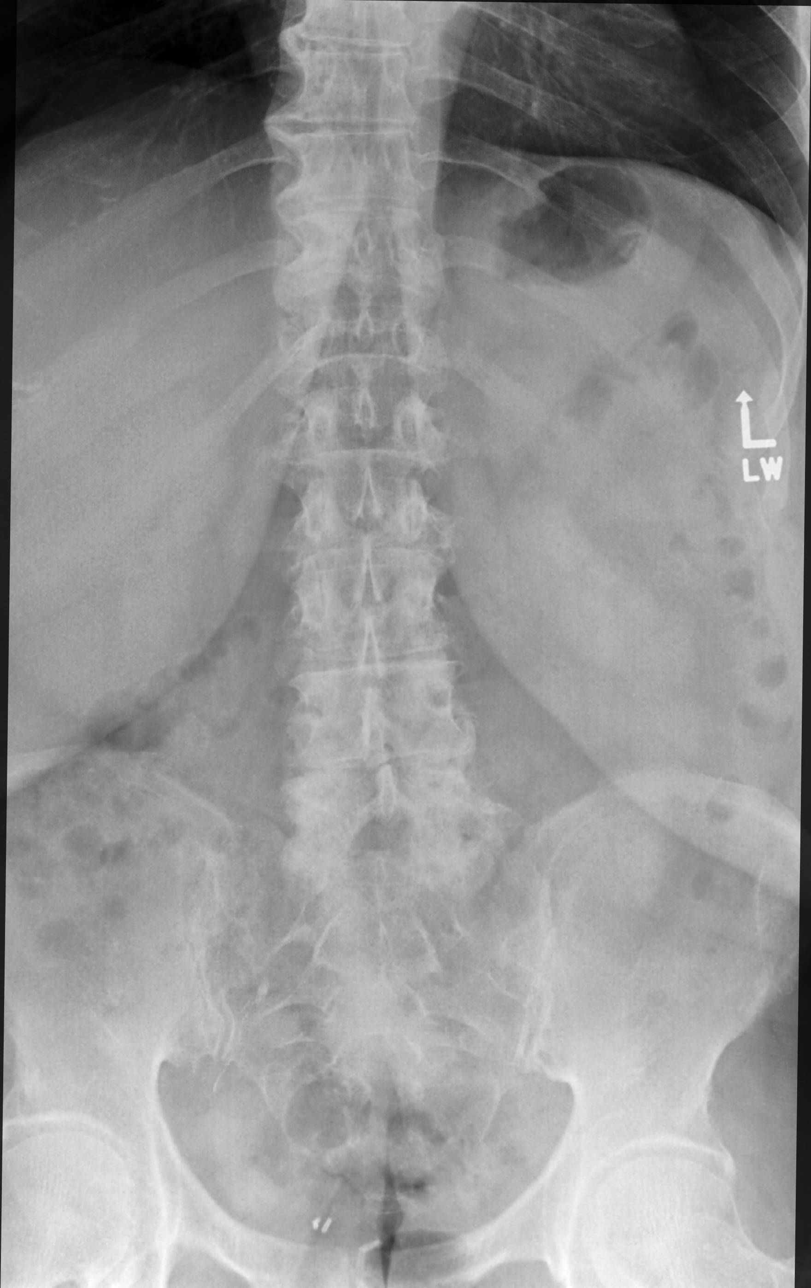

[lumbar spine obl (oblique) (1 of 2)]
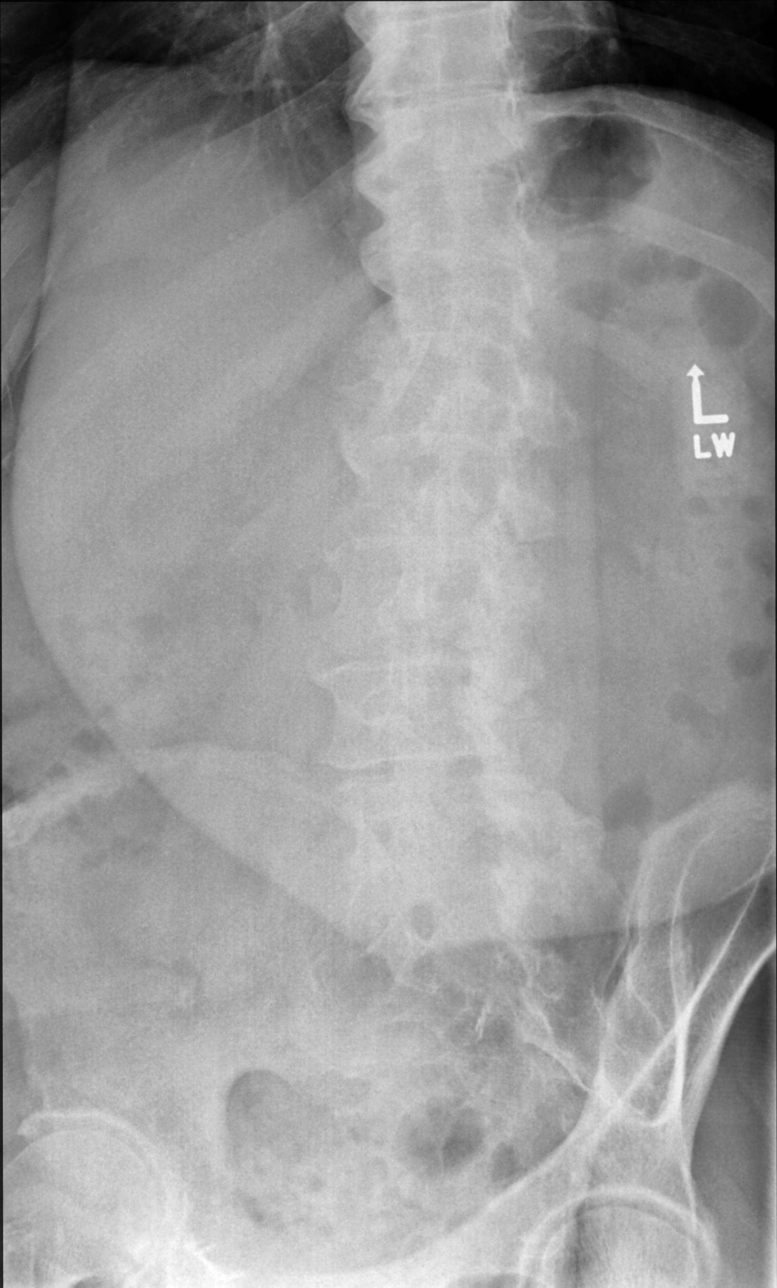

[lumbar spine obl (oblique) (2 of 2)]
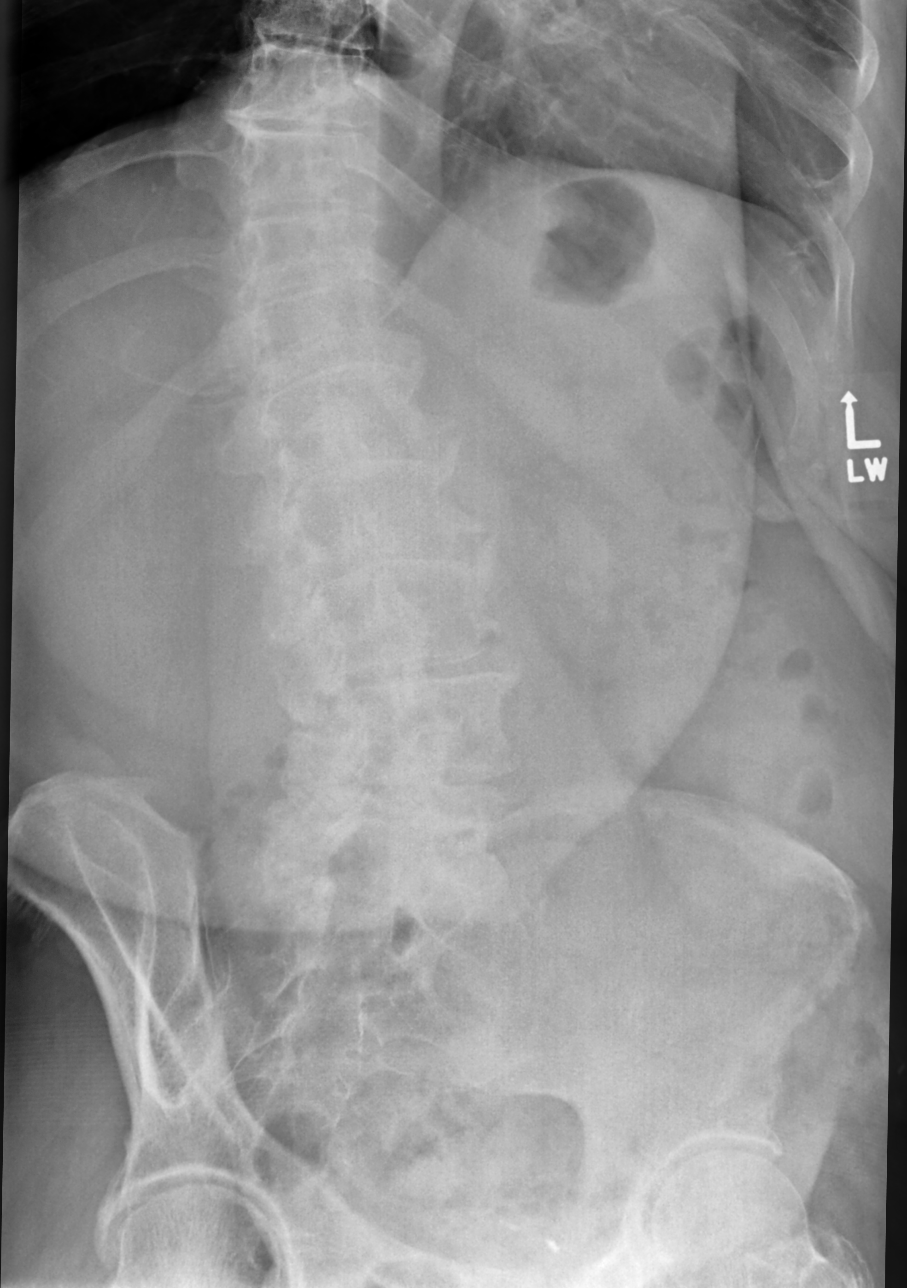

[lumbar spine lat]
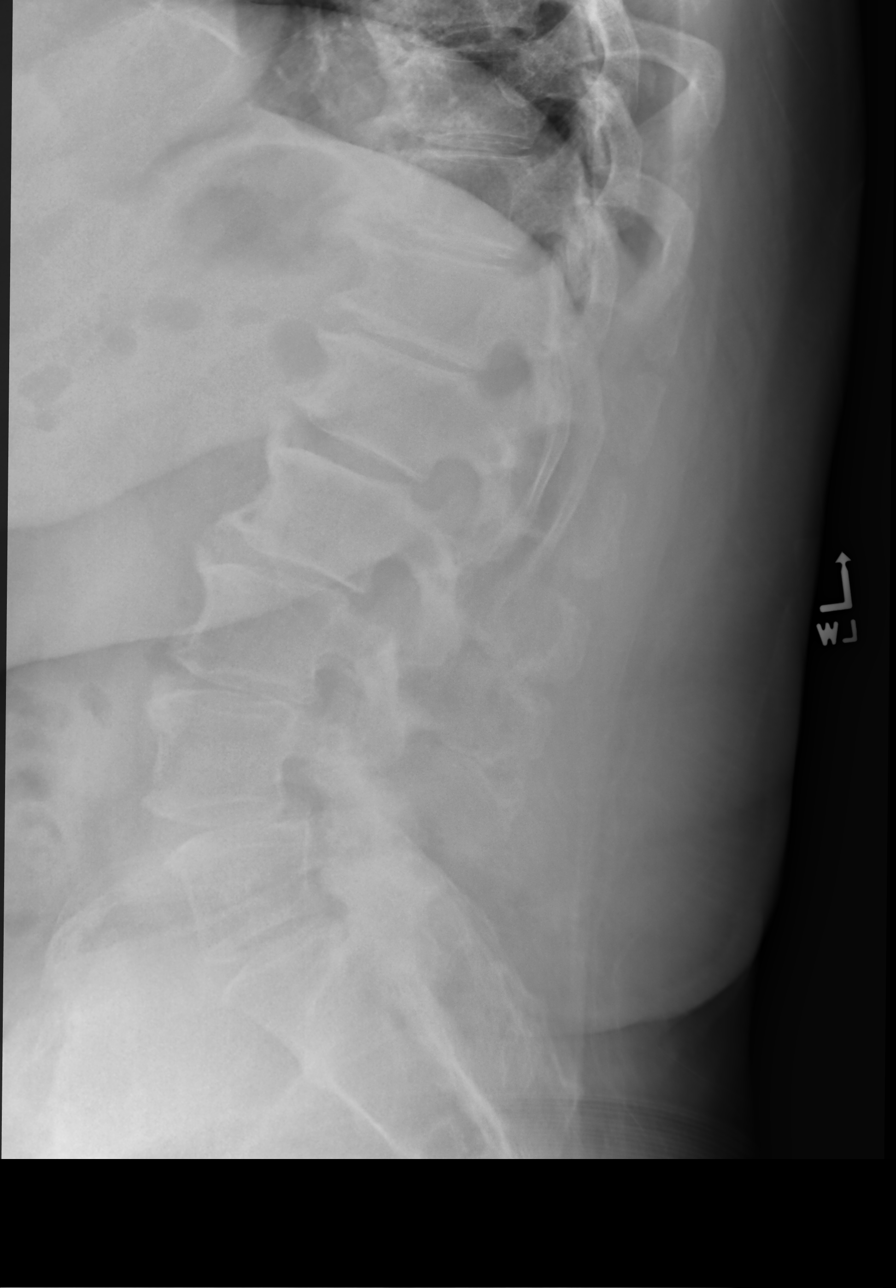

[lumbar spot lat]
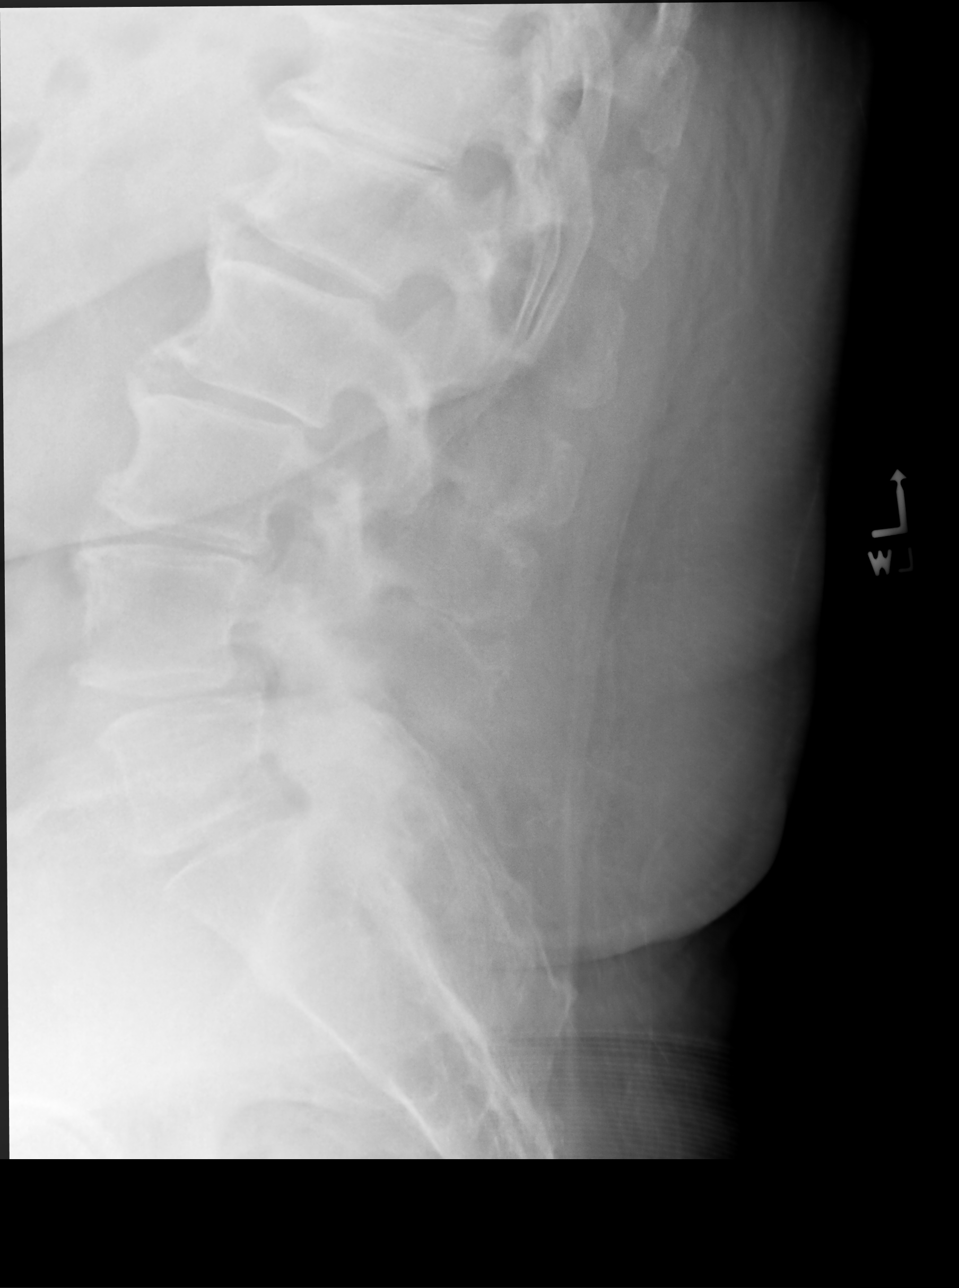

[5 of 5 positions shown; findings below may reference images not displayed]

FINDINGS: Vertebral body height is maintained. Facet degenerative disease
results in 0.7 cm anterolisthesis L4 on L5. Trace degenerative
retrolisthesis L2 on L3 and L3 on L4 is also noted. Loss of disc
space height and endplate spurring are seen at all levels.
Paraspinous structures demonstrate no acute or focal abnormality.
IMPRESSION: Multilevel degenerative disease.

Negative for fracture.  No acute finding.

## 2019-07-16 DIAGNOSIS — Z23 Encounter for immunization: Secondary | ICD-10-CM | POA: Diagnosis not present

## 2019-08-07 ENCOUNTER — Ambulatory Visit: Payer: Medicare Other | Admitting: Internal Medicine

## 2019-08-07 ENCOUNTER — Other Ambulatory Visit: Payer: Self-pay

## 2019-08-07 ENCOUNTER — Ambulatory Visit (INDEPENDENT_AMBULATORY_CARE_PROVIDER_SITE_OTHER): Payer: Medicare Other

## 2019-08-07 DIAGNOSIS — Z0000 Encounter for general adult medical examination without abnormal findings: Secondary | ICD-10-CM

## 2019-08-07 NOTE — Patient Instructions (Addendum)
  Jeanette Spencer , Thank you for taking time to come for your Medicare Wellness Visit. I appreciate your ongoing commitment to your health goals. Please review the following plan we discussed and let me know if I can assist you in the future.   These are the goals we discussed: Goals    . DIET - INCREASE LEAN PROTEINS     Low carb diet    . Increase physical activity     Stay active with walking, chair exercises       This is a list of the screening recommended for you and due dates:  Health Maintenance  Topic Date Due  .  Hepatitis C: One time screening is recommended by Center for Disease Control  (CDC) for  adults born from [birth year] through 1966.   09-25-1951  . DEXA scan (bone density measurement)  11/05/2014  . Flu Shot  04/27/2019  . Hemoglobin A1C  05/10/2019  . Eye exam for diabetics  06/12/2019  . Complete foot exam   08/07/2019  . Mammogram  04/12/2020  . Tetanus Vaccine  07/13/2023  . Colon Cancer Screening  09/24/2028  . Pneumonia vaccines  Completed

## 2019-08-07 NOTE — Progress Notes (Signed)
Subjective:   Jeanette Spencer is a 69 y.o. female who presents for Medicare Annual (Subsequent) preventive examination.  Review of Systems:  No ROS.  Medicare Wellness Virtual Visit.  Visual/audio telehealth visit, UTA vital signs.   See social history for additional risk factors.   Cardiac Risk Factors include: advanced age (>5men, >28 women);hypertension;diabetes mellitus     Objective:     Vitals: There were no vitals taken for this visit.  There is no height or weight on file to calculate BMI.  Advanced Directives 08/07/2019 09/24/2018 05/30/2018 06/27/2016  Does Patient Have a Medical Advance Directive? No No No No  Would patient like information on creating a medical advance directive? No - Patient declined No - Patient declined No - Patient declined No - patient declined information    Tobacco Social History   Tobacco Use  Smoking Status Never Smoker  Smokeless Tobacco Never Used     Counseling given: Not Answered   Clinical Intake:  Pre-visit preparation completed: Yes        Diabetes: Yes(Followed by pcp)  How often do you need to have someone help you when you read instructions, pamphlets, or other written materials from your doctor or pharmacy?: 1 - Never  Interpreter Needed?: No     Past Medical History:  Diagnosis Date  . Allergy   . Arthritis    knees  . Chicken pox   . Chronic bronchitis ([place])   . Diabetes mellitus without complication ([place])    type 2  . Hypertension   . Phlebitis   . Pulmonary embolism ([place]) 1990   after abdominal tumor removal   Past Surgical History:  Procedure Laterality Date  . ABDOMINAL HYSTERECTOMY    . abdominal tumor Left    size of grapefruit  . COLONOSCOPY WITH PROPOFOL N/A 09/24/2018   Procedure: COLONOSCOPY WITH BIOPSY;  Surgeon: [name], MD;  Location: [facility];  Service: Endoscopy;  Laterality: N/A;  diabetic - oral meds  requests arrival after 8:30  . POLYPECTOMY N/A 09/24/2018   Procedure: POLYPECTOMY;  Surgeon: [name], MD;  Location: [place];  Service: Endoscopy;  Laterality: N/A;  . TONSILECTOMY, ADENOIDECTOMY, BILATERAL MYRINGOTOMY AND TUBES     Family History  Problem Relation Age of Onset  . Diabetes Mother   . Cancer Father   . Cancer Paternal Aunt   . Cancer Paternal Uncle   . Diabetes Paternal Grandmother    Social History   Socioeconomic History  . Marital status: Single    Spouse name: Not on file  . Number of children: Not on file  . Years of education: Not on file  . Highest education level: Not on file  Occupational History  . Not on file  Social Needs  . Financial resource strain: Not hard at all  . Food insecurity    Worry: Never true    Inability: Never true  . Transportation needs    Medical: No    Non-medical: No  Tobacco Use  . Smoking status: Never Smoker  . Smokeless tobacco: Never Used  Substance and Sexual Activity  . Alcohol use: Yes    Comment:  rarely - Holidays  . Drug use: No  . Sexual activity: Never  Lifestyle  . Physical activity    Days per week: Not on file    Minutes per session: Not on file  . Stress: Only a little  Relationships  . Social Herbalist on phone: Not  on file    Gets together: Not on file    Attends religious service: Not on file    Active member of club or organization: Not on file    Attends meetings of clubs or organizations: Not on file    Relationship status: Divorced  Other Topics Concern  . Not on file  Social History Narrative  . Not on file    Outpatient Encounter Medications as of 08/07/2019  Medication Sig  . acyclovir (ZOVIRAX) 400 MG tablet Take 1 tablet (400 mg total) by mouth 5 (five) times daily.  [name] atorvastatin (LIPITOR) 20 MG tablet Take 1 tablet (20 mg total) by mouth daily.  . COD LIVER OIL PO Take by mouth as needed.  [name] glucose blood test strip Use as instructed to check sugars twice daily.  Uncontrolled diabetes mellitus E11.65  .  metFORMIN (GLUCOPHAGE) 1000 MG tablet Take 1 tablet (1,000 mg total) by mouth 2 (two) times daily with a meal. (Patient taking differently: Take 1,000 mg by mouth 2 (two) times daily with a meal. )  . Multiple Vitamin (MULTIVITAMIN) tablet Take 1 tablet by mouth daily.  [name] glipiZIDE (GLUCOTROL) 10 MG tablet Take 1 tablet (10 mg total) by mouth 2 (two) times daily before a meal. (Patient not taking: Reported on 08/07/2019)  . telmisartan-hydrochlorothiazide (MICARDIS HCT) 80-25 MG tablet Take 1 tablet by mouth daily. In the evening (Patient not taking: Reported on 08/07/2019)   No facility-administered encounter medications on file as of 08/07/2019.     Activities of Daily Living In your present state of health, do you have any difficulty performing the following activities: 08/07/2019 09/24/2018  Hearing? N N  Vision? N N  Difficulty concentrating or making decisions? N N  Walking or climbing stairs? N N  Dressing or bathing? N N  Doing errands, shopping? N -  Preparing Food and eating ? N -  Using the Toilet? N -  In the past six months, have you accidently leaked urine? N -  Do you have problems with loss of bowel control? N -  Managing your Medications? N -  Managing your Finances? N -  Housekeeping or managing your Housekeeping? N -  Some recent data might be hidden    Patient Care Team: [name], MD as PCP - General (Internal Medicine)    Assessment:   This is a routine wellness examination for Jeanette Spencer.  Nurse connected with patient 08/07/19 at  9:00 AM EST by a telephone enabled telemedicine application and verified that I am speaking with the correct person using two identifiers. Patient stated full name and DOB. Patient gave permission to continue with virtual visit. Patient's location was at home and Nurse's location was at [facility] office.   Health Maintenance Due: -Dexa Scan- declined -Eye Exam- she plans to schedule -Foot Exam- followed by pcp. Denies any wounds,  numbness, tingling.  -Hgb A1c- 11/09/18 (8.1)  Update all pending maintenance due as appropriate.   See completed HM at the end of note.   Eye: Visual acuity not assessed. Virtual visit. Wears corrective lenses. Followed by their ophthalmologist. Last seen 05/2018.  Retinopathy- none reported  Dental: Visits every 12 months.    Hearing: Demonstrates normal hearing during visit.  Safety:  Patient feels safe at home- yes Patient does have smoke detectors at home- yes Patient does wear sunscreen or protective clothing when in direct sunlight - yes Patient does wear seat belt when in a moving vehicle - yes Patient drives- yes  Adequate lighting in walkways free from debris- yes Grab bars and handrails used as appropriate- yes Ambulates with no assistive device Cell phone on person when ambulating outside of the home- yes  Social: Alcohol intake - yes      Smoking history- never  Smokers in home? none Illicit drug use? none  Depression: PHQ 2 &9 complete. See screening below. Denies irritability, anhedonia, sadness/tearfullness.     Falls: See screening below.    Medication: Taking as directed and without issues. Not taking glipizide or telmisartan  Covid-19: Precautions and sickness symptoms discussed. Wears mask, social distancing, hand hygiene as appropriate.   Activities of Daily Living Patient denies needing assistance with: household chores, feeding themselves, getting from bed to chair, getting to the toilet, bathing/showering, dressing, managing money, or preparing meals.   Memory: Patient is alert. Patient denies difficulty focusing or concentrating. Correctly identified the president of the [place], season and recall.  Patient still works weekly assisting clients.   BMI- discussed the importance of a healthy diet, water intake and the benefits of aerobic exercise.  Educational material provided.  Physical activity- chair exercises, foot peddler, leg and arm  lifts  Diet:  Regular Water: 32-64 ounces daily  Other Providers Patient Care Team: [name], MD as PCP - General (Internal Medicine) Exercise Activities and Dietary recommendations Current Exercise Habits: Home exercise routine, Type of exercise: stretching(chair exercises, peddler), Intensity: Mild  Goals    . DIET - INCREASE LEAN PROTEINS     Low carb diet    . Increase physical activity     Stay active with walking, chair exercises       Fall Risk Fall Risk  08/07/2019 05/30/2018 09/27/2017  Falls in the past year? 0 No No   Timed Get Up and Go performed: no, virtual visit  Depression Screen PHQ 2/9 Scores 08/07/2019 05/30/2018 09/27/2017  PHQ - 2 Score 0 0 0  PHQ- 9 Score - - 1     Cognitive Function MMSE - Mini Mental State Exam 05/30/2018  Orientation to time 5  Orientation to Place 5  Registration 3  Attention/ Calculation 5  Recall 2  Language- name 2 objects 2  Language- repeat 1  Language- follow 3 step command 3  Language- read & follow direction 1  Write a sentence 1  Copy design 1  Total score 29     6CIT Screen 08/07/2019  What Year? 0 points  What month? 0 points  What time? 0 points  Count back from 20 0 points  Months in reverse 0 points  Repeat phrase 2 points  Total Score 2    Immunization History  Administered Date(s) Administered  . Influenza Whole 07/23/2012  . Influenza-Unspecified 07/11/2013, 07/10/2014, 07/11/2017, 07/11/2018  . Pneumococcal Conjugate-13 07/12/2013  . Pneumococcal Polysaccharide-23 09/27/2017  . Tdap 05/23/2005, 07/12/2013   Screening Tests Health Maintenance  Topic Date Due  . Hepatitis C Screening  [birth year]  . DEXA SCAN  11/05/2014  . INFLUENZA VACCINE  04/27/2019  . HEMOGLOBIN A1C  05/10/2019  . OPHTHALMOLOGY EXAM  06/12/2019  . FOOT EXAM  08/07/2019  . MAMMOGRAM  04/12/2020  . TETANUS/TDAP  07/13/2023  . COLONOSCOPY  09/24/2028  . PNA vac Low Risk Adult  Completed      Plan:   Keep all  routine maintenance appointments.   Follow up 08/29/19 @ 11:00  Medicare Attestation I have personally reviewed: The patient's medical and social history Their use of alcohol, tobacco or illicit drugs Their  current medications and supplements The patient's functional ability including ADLs,fall risks, home safety risks, cognitive, and hearing and visual impairment Diet and physical activities Evidence for depression   In addition, I have reviewed and discussed with patient certain preventive protocols, quality metrics, and best practice recommendations. A written personalized care plan for preventive services as well as general preventive health recommendations were provided to patient via mail.     Varney Biles, LPN  [phone]

## 2019-08-27 ENCOUNTER — Other Ambulatory Visit: Payer: Self-pay

## 2019-08-29 ENCOUNTER — Ambulatory Visit: Payer: Medicare Other | Admitting: Internal Medicine

## 2019-08-30 ENCOUNTER — Other Ambulatory Visit (INDEPENDENT_AMBULATORY_CARE_PROVIDER_SITE_OTHER): Payer: Medicare Other

## 2019-08-30 ENCOUNTER — Other Ambulatory Visit: Payer: Self-pay

## 2019-08-30 DIAGNOSIS — R05 Cough: Secondary | ICD-10-CM | POA: Diagnosis not present

## 2019-08-30 DIAGNOSIS — E785 Hyperlipidemia, unspecified: Secondary | ICD-10-CM | POA: Diagnosis not present

## 2019-08-30 DIAGNOSIS — E1121 Type 2 diabetes mellitus with diabetic nephropathy: Secondary | ICD-10-CM | POA: Diagnosis not present

## 2019-08-30 DIAGNOSIS — R059 Cough, unspecified: Secondary | ICD-10-CM

## 2019-08-30 DIAGNOSIS — E1169 Type 2 diabetes mellitus with other specified complication: Secondary | ICD-10-CM | POA: Diagnosis not present

## 2019-08-30 DIAGNOSIS — R109 Unspecified abdominal pain: Secondary | ICD-10-CM | POA: Diagnosis not present

## 2019-08-30 LAB — CBC WITH DIFFERENTIAL/PLATELET
Basophils Absolute: 0 10*3/uL (ref 0.0–0.1)
Basophils Relative: 0.4 % (ref 0.0–3.0)
Eosinophils Absolute: 0.1 10*3/uL (ref 0.0–0.7)
Eosinophils Relative: 1 % (ref 0.0–5.0)
HCT: 35.3 % — ABNORMAL LOW (ref 36.0–46.0)
Hemoglobin: 11 g/dL — ABNORMAL LOW (ref 12.0–15.0)
Lymphocytes Relative: 29.1 % (ref 12.0–46.0)
Lymphs Abs: 2.3 10*3/uL (ref 0.7–4.0)
MCHC: 31.2 g/dL (ref 30.0–36.0)
MCV: 84.9 fl (ref 78.0–100.0)
Monocytes Absolute: 0.4 10*3/uL (ref 0.1–1.0)
Monocytes Relative: 4.8 % (ref 3.0–12.0)
Neutro Abs: 5 10*3/uL (ref 1.4–7.7)
Neutrophils Relative %: 64.7 % (ref 43.0–77.0)
Platelets: 294 10*3/uL (ref 150.0–400.0)
RBC: 4.16 Mil/uL (ref 3.87–5.11)
RDW: 14.6 % (ref 11.5–15.5)
WBC: 7.8 10*3/uL (ref 4.0–10.5)

## 2019-08-30 LAB — COMPREHENSIVE METABOLIC PANEL
ALT: 11 U/L (ref 0–35)
AST: 12 U/L (ref 0–37)
Albumin: 4.2 g/dL (ref 3.5–5.2)
Alkaline Phosphatase: 61 U/L (ref 39–117)
BUN: 31 mg/dL — ABNORMAL HIGH (ref 6–23)
CO2: 26 mEq/L (ref 19–32)
Calcium: 9.8 mg/dL (ref 8.4–10.5)
Chloride: 105 mEq/L (ref 96–112)
Creatinine, Ser: 1.47 mg/dL — ABNORMAL HIGH (ref 0.40–1.20)
GFR: 42.54 mL/min — ABNORMAL LOW (ref >60.00)
Glucose, Bld: 130 mg/dL — ABNORMAL HIGH (ref 70–99)
Potassium: 4.7 mEq/L (ref 3.5–5.1)
Sodium: 139 mEq/L (ref 135–145)
Total Bilirubin: 0.3 mg/dL (ref 0.2–1.2)
Total Protein: 7.1 g/dL (ref 6.0–8.3)

## 2019-08-30 LAB — LIPID PANEL
Cholesterol: 278 mg/dL — ABNORMAL HIGH (ref 0–200)
HDL: 54.2 mg/dL (ref >39.00)
LDL Cholesterol: 186 mg/dL — ABNORMAL HIGH (ref 0–99)
NonHDL: 223.55
Total CHOL/HDL Ratio: 5
Triglycerides: 187 mg/dL — ABNORMAL HIGH (ref 0.0–149.0)
VLDL: 37.4 mg/dL (ref 0.0–40.0)

## 2019-08-30 LAB — HEMOGLOBIN A1C: Hgb A1c MFr Bld: 7.6 % — ABNORMAL HIGH (ref 4.6–6.5)

## 2019-08-30 NOTE — Addendum Note (Signed)
Addended by: Elpidio Galea T on: 08/30/2019 10:29 AM   Modules accepted: Orders

## 2019-08-30 NOTE — Addendum Note (Signed)
Addended by: Elpidio Galea T on: 08/30/2019 10:28 AM   Modules accepted: Orders

## 2019-09-01 LAB — URINE CULTURE
MICRO NUMBER:: 1164841
SPECIMEN QUALITY:: ADEQUATE

## 2019-09-02 ENCOUNTER — Other Ambulatory Visit: Payer: Self-pay

## 2019-09-02 ENCOUNTER — Ambulatory Visit (INDEPENDENT_AMBULATORY_CARE_PROVIDER_SITE_OTHER): Payer: Medicare Other | Admitting: Internal Medicine

## 2019-09-02 ENCOUNTER — Encounter: Payer: Self-pay | Admitting: Internal Medicine

## 2019-09-02 VITALS — Ht 59.75 in | Wt 182.0 lb

## 2019-09-02 DIAGNOSIS — N183 Chronic kidney disease, stage 3 unspecified: Secondary | ICD-10-CM | POA: Insufficient documentation

## 2019-09-02 DIAGNOSIS — E1121 Type 2 diabetes mellitus with diabetic nephropathy: Secondary | ICD-10-CM | POA: Diagnosis not present

## 2019-09-02 DIAGNOSIS — D513 Other dietary vitamin B12 deficiency anemia: Secondary | ICD-10-CM | POA: Diagnosis not present

## 2019-09-02 DIAGNOSIS — R944 Abnormal results of kidney function studies: Secondary | ICD-10-CM | POA: Diagnosis not present

## 2019-09-02 DIAGNOSIS — E1169 Type 2 diabetes mellitus with other specified complication: Secondary | ICD-10-CM

## 2019-09-02 DIAGNOSIS — B952 Enterococcus as the cause of diseases classified elsewhere: Secondary | ICD-10-CM | POA: Diagnosis not present

## 2019-09-02 DIAGNOSIS — E538 Deficiency of other specified B group vitamins: Secondary | ICD-10-CM | POA: Diagnosis not present

## 2019-09-02 DIAGNOSIS — E785 Hyperlipidemia, unspecified: Secondary | ICD-10-CM

## 2019-09-02 DIAGNOSIS — D649 Anemia, unspecified: Secondary | ICD-10-CM | POA: Diagnosis not present

## 2019-09-02 DIAGNOSIS — N390 Urinary tract infection, site not specified: Secondary | ICD-10-CM | POA: Diagnosis not present

## 2019-09-02 DIAGNOSIS — I10 Essential (primary) hypertension: Secondary | ICD-10-CM

## 2019-09-02 DIAGNOSIS — Z1159 Encounter for screening for other viral diseases: Secondary | ICD-10-CM | POA: Diagnosis not present

## 2019-09-02 DIAGNOSIS — E1122 Type 2 diabetes mellitus with diabetic chronic kidney disease: Secondary | ICD-10-CM | POA: Insufficient documentation

## 2019-09-02 MED ORDER — ATORVASTATIN CALCIUM 20 MG PO TABS: 20.0000 mg | ORAL_TABLET | Freq: Every day | ORAL | 3 refills | Status: DC

## 2019-09-02 MED ORDER — CIPROFLOXACIN HCL 250 MG PO TABS: 250.0000 mg | ORAL_TABLET | Freq: Two times a day (BID) | ORAL | 0 refills | Status: DC

## 2019-09-02 NOTE — Assessment & Plan Note (Signed)
Has not been taking any supplements for months . Advised to resume oral supplements and recheck

## 2019-09-02 NOTE — Assessment & Plan Note (Signed)
LDL rose by 100 pts with atorvastatin.  Advised to resume   Lab Results  Component Value Date   CHOL 278 (H) 08/30/2019   HDL 54.20 08/30/2019   LDLCALC 186 (H) 08/30/2019   LDLDIRECT 172.0 09/27/2017   TRIG 187.0 (H) 08/30/2019   CHOLHDL 5 08/30/2019

## 2019-09-02 NOTE — Assessment & Plan Note (Signed)
Repeat in 2 weeks in non fasting state

## 2019-09-02 NOTE — Assessment & Plan Note (Signed)
With dysuria.  cipro 250- mg bid,

## 2019-09-02 NOTE — Progress Notes (Signed)
Telephone Note  This visit type was conducted due to national recommendations for restrictions regarding the COVID-19 pandemic (e.g. social distancing).  This format is felt to be most appropriate for this patient at this time.  All issues noted in this document were discussed and addressed.  No physical exam was performed (except for noted visual exam findings with Video Visits).   I connected with@ on 09/02/19 at  8:30 AM EST by  telephone and verified that I am speaking with the correct person using two identifiers. Location patient: home Location provider: work or home office Persons participating in the virtual visit: patient, provider  I discussed the limitations, risks, security and privacy concerns of performing an evaluation and management service by telephone and the availability of in person appointments. I also discussed with the patient that there may be a patient responsible charge related to this service. The patient expressed understanding and agreed to proceed.  Reason for visit:  Flank pain,  6 month follow up on T2Dm,    HPI:   Only taking one metformin  Once daily   In the morning.  Stopped glipizide due to recurrent low blood sugars.  Fasting sugars  Have been 134, 121, 147    Did not start telmisartan but stopped lisinopril as directed  Has not had BP checked   Dysuria for the past several days  Had an episode of shingles  Involving the right lower leg last month.     stopped statin due to dizziness but no longer taking b12 .   willing to resume    ROS: See pertinent positives and negatives per HPI.  Past Medical History:  Diagnosis Date  . Allergy   . Arthritis    knees  . Chicken pox   . Chronic bronchitis ([place])   . Diabetes mellitus without complication ([place])    type 2  . Hypertension   . Phlebitis   . Pulmonary embolism ([place]) 1990   after abdominal tumor removal    Past Surgical History:  Procedure Laterality Date  . ABDOMINAL HYSTERECTOMY    .  abdominal tumor Left    size of grapefruit  . COLONOSCOPY WITH PROPOFOL N/A 09/24/2018   Procedure: COLONOSCOPY WITH BIOPSY;  Surgeon: [name], MD;  Location: [facility];  Service: Endoscopy;  Laterality: N/A;  diabetic - oral meds  requests arrival after 8:30  . POLYPECTOMY N/A 09/24/2018   Procedure: POLYPECTOMY;  Surgeon: [name], MD;  Location: [facility];  Service: Endoscopy;  Laterality: N/A;  . TONSILECTOMY, ADENOIDECTOMY, BILATERAL MYRINGOTOMY AND TUBES      Family History  Problem Relation Age of Onset  . Diabetes Mother   . Cancer Father   . Cancer Paternal Aunt   . Cancer Paternal Uncle   . Diabetes Paternal Grandmother     SOCIAL HX:  reports that she has never smoked. She has never used smokeless tobacco. She reports current alcohol use. She reports that she does not use drugs.    Current Outpatient Medications:  .  glucose blood test strip, Use as instructed to check sugars twice daily.  Uncontrolled diabetes mellitus E11.65, Disp: 100 each, Rfl: 12 .  metFORMIN (GLUCOPHAGE) 1000 MG tablet, Take 1 tablet (1,000 mg total) by mouth 2 (two) times daily with a meal., Disp: 180 tablet, Rfl: 2 .  Multiple Vitamin (MULTIVITAMIN) tablet, Take 1 tablet by mouth daily., Disp: , Rfl:  .  atorvastatin (LIPITOR) 20 MG tablet, Take 1 tablet (20 mg total)  by mouth daily., Disp: 90 tablet, Rfl: 3 .  ciprofloxacin (CIPRO) 250 MG tablet, Take 1 tablet (250 mg total) by mouth 2 (two) times daily., Disp: 6 tablet, Rfl: 0  EXAM:   General impression: alert, cooperative and articulate.  No signs of being in distress  Lungs: speech is fluent sentence length suggests that patient is not short of breath and not punctuated by cough, sneezing or sniffing. [name]   Psych: affect normal.  speech is articulate and non pressured .  Denies suicidal thoughts   ASSESSMENT AND PLAN:  Discussed the following assessment and plan:  Anemia, unspecified type - Plan: Vitamin B12,  Iron, TIBC and Ferritin Panel  Essential hypertension, benign  Diabetic nephropathy associated with type 2 diabetes mellitus ([place]) - Plan: Microalbumin / creatinine urine ratio, Renal Function Panel  Encounter for hepatitis C screening test for low risk patient - Plan: Hepatitis C Antibody  Other dietary vitamin B12 deficiency anemia  - Plan: Vitamin B12, Intrinsic Factor Antibodies  B12 deficiency  Hyperlipidemia associated with type 2 diabetes mellitus ([facility])  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 Advised to resume oral supplements and recheck   Essential hypertension, benign Stopped lisinopril,  Did not start the telmisartan  Has not checked BP.  RN visit needed 2 weeks and repeat assessment for proteinuria  Hyperlipidemia associated with type 2 diabetes mellitus LDL rose by 100 pts with atorvastatin.  Advised to resume   Lab Results  Component Value Date   CHOL 278 (H) 08/30/2019   HDL 54.20 08/30/2019   LDLCALC 186 (H) 08/30/2019   LDLDIRECT 172.0 09/27/2017   TRIG 187.0 (H) 08/30/2019   CHOLHDL 5 08/30/2019     Type 2 diabetes mellitus with nephropathy ([place]) Not at goal on metformin once daily.  Advised to add evening dose.   Lab Results  Component Value Date   HGBA1C 7.6 (H) 08/30/2019     UTI (urinary tract infection) due to Enterococcus With dysuria.  cipro 250- mg bid,   Decreased calculated GFR Repeat in 2 weeks in non fasting state     I discussed the assessment and treatment plan with the patient. The patient was provided an opportunity to ask questions and all were answered. The patient agreed with the plan and demonstrated an understanding of the instructions.   The patient was advised to call back or seek an in-person evaluation if the symptoms worsen or if the condition fails to improve as  anticipated.  I provided  25 minutes of non-face-to-face time during this encounter reviewing patient's current problems and post surgeries.  Providing counseling on the above mentioned problems , and coordination  of care . [name], MD

## 2019-09-02 NOTE — Patient Instructions (Signed)
Resume b12 supplements  increase metformin to two times daily   Resume atorvastatin for HIGH CHOLESTEROL   Take cipro twice daily for 3 days for your UTI   Return in 2 weeks to recheck kidney function and habe BP checked by RN

## 2019-09-02 NOTE — Assessment & Plan Note (Signed)
Not at goal on metformin once daily.  Advised to add evening dose.   Lab Results  Component Value Date   HGBA1C 7.6 (H) 08/30/2019

## 2019-09-02 NOTE — Assessment & Plan Note (Signed)
Stopped lisinopril,  Did not start the telmisartan  Has not checked BP.  RN visit needed 2 weeks and repeat assessment for proteinuria

## 2019-09-17 LAB — HM DIABETES EYE EXAM

## 2019-09-25 ENCOUNTER — Telehealth: Payer: Self-pay | Admitting: Internal Medicine

## 2019-09-25 DIAGNOSIS — I10 Essential (primary) hypertension: Secondary | ICD-10-CM

## 2019-09-25 NOTE — Telephone Encounter (Signed)
This is the correct Jeanette Spencer,  Who blood pressure was noted to be quite elevated by her eye doctor.  Please contact her and schedule an RN visit to check BP

## 2019-09-26 ENCOUNTER — Other Ambulatory Visit: Payer: Self-pay

## 2019-09-26 ENCOUNTER — Telehealth: Payer: Self-pay

## 2019-09-26 ENCOUNTER — Other Ambulatory Visit (INDEPENDENT_AMBULATORY_CARE_PROVIDER_SITE_OTHER): Payer: Medicare Other

## 2019-09-26 ENCOUNTER — Ambulatory Visit: Payer: Medicare Other | Admitting: *Deleted

## 2019-09-26 VITALS — BP 182/82 | HR 63 | Temp 98.0°F | Resp 20 | Wt 188.5 lb

## 2019-09-26 DIAGNOSIS — I10 Essential (primary) hypertension: Secondary | ICD-10-CM

## 2019-09-26 DIAGNOSIS — E1121 Type 2 diabetes mellitus with diabetic nephropathy: Secondary | ICD-10-CM | POA: Diagnosis not present

## 2019-09-26 DIAGNOSIS — D513 Other dietary vitamin B12 deficiency anemia: Secondary | ICD-10-CM

## 2019-09-26 DIAGNOSIS — D649 Anemia, unspecified: Secondary | ICD-10-CM

## 2019-09-26 DIAGNOSIS — Z1159 Encounter for screening for other viral diseases: Secondary | ICD-10-CM | POA: Diagnosis not present

## 2019-09-26 LAB — RENAL FUNCTION PANEL
Albumin: 4.3 g/dL (ref 3.5–5.2)
BUN: 31 mg/dL — ABNORMAL HIGH (ref 6–23)
CO2: 26 mEq/L (ref 19–32)
Calcium: 9.6 mg/dL (ref 8.4–10.5)
Chloride: 105 mEq/L (ref 96–112)
Creatinine, Ser: 1.31 mg/dL — ABNORMAL HIGH (ref 0.40–1.20)
GFR: 48.58 mL/min — ABNORMAL LOW (ref >60.00)
Glucose, Bld: 96 mg/dL (ref 70–99)
Phosphorus: 3.3 mg/dL (ref 2.3–4.6)
Potassium: 4.7 mEq/L (ref 3.5–5.1)
Sodium: 141 mEq/L (ref 135–145)

## 2019-09-26 LAB — MICROALBUMIN / CREATININE URINE RATIO
Creatinine,U: 63.6 mg/dL
Microalb Creat Ratio: 7.7 mg/g (ref 0.0–30.0)
Microalb, Ur: 4.9 mg/dL — ABNORMAL HIGH (ref 0.0–1.9)

## 2019-09-26 LAB — VITAMIN B12: Vitamin B-12: 551 pg/mL (ref 211–911)

## 2019-09-26 MED ORDER — TELMISARTAN-HCTZ 80-25 MG PO TABS: 1.0000 | ORAL_TABLET | Freq: Every day | ORAL | 0 refills | Status: DC

## 2019-09-26 MED ORDER — LISINOPRIL-HYDROCHLOROTHIAZIDE 20-25 MG PO TABS: 1.0000 | ORAL_TABLET | Freq: Every day | ORAL | 3 refills | Status: DC

## 2019-09-26 NOTE — Progress Notes (Signed)
See note medication to expensive.

## 2019-09-26 NOTE — Telephone Encounter (Signed)
Patient went to pickup medication and it is to expensive at $111.00 is there something else she can take, She said can she take the Losartan HCTZ she has taken before?

## 2019-09-26 NOTE — Telephone Encounter (Signed)
Yes I sent lisinopril hctz. .  Can you call the pharmacy and cancel the telmisartan?

## 2019-09-26 NOTE — Telephone Encounter (Signed)
BP is dangerously high .  patient needs to start the telmisartan immediately and return in 2 weeks for repeat BP check    I have resent the telmisartan hct to [facility]

## 2019-09-26 NOTE — Progress Notes (Addendum)
Patient here for nurse visit BP check per order from 09/25/19 phone note   Patient reports compliance with prescribed BP medications: Patient has taken no BP medication in 4 weeks per patient , patient has bilateral pitting edema +2 to bilateral legs that does not go down with elevation and complaint of tightness in hands. Patient initial BP and proceeding BP were taken !5 minutes apart initial BP  182/100 in right arm and Left arm 172/82. Patient advised nurse legs are swollen in morning and through out day they worsen and do not  Go completely down at night . Nurse noted +2 edema bilateral.  Last dose of BP medication:   BP Readings from Last 3 Encounters:  09/26/19 (!) 160/82  11/09/18 122/78  09/24/18 126/70   Pulse Readings from Last 3 Encounters:  09/26/19 63  11/09/18 92  09/24/18 88      Patient verbalized understanding of instructions.   Kerin Salen, RN    I have reviewed the above information and agree with above.   patien tnees to start the telmisartan hct that I prescribed over a month ago.  I have resent it oto [facility]  She needs a repeat bp check in 2 weeks   Deborra Medina, MD

## 2019-09-26 NOTE — Telephone Encounter (Signed)
Pt was in the office the office this morning for RN visit. Juliann Pulse has sent you the encounter. She stated that pts blood pressure was still elevated this morning.

## 2019-09-26 NOTE — Telephone Encounter (Signed)
Pt called and stated that the Telmisartan-HCTZ that was sent in is too expensive and would like to know if something else could be sent in for her.

## 2019-09-26 NOTE — Telephone Encounter (Signed)
Left message with pt letting her know that a different blood pressure medication was called in for her. Also spoke with the pharmacy to let them know to cancel the telmisartan-hctz.

## 2019-09-30 ENCOUNTER — Other Ambulatory Visit: Payer: Self-pay | Admitting: Internal Medicine

## 2019-09-30 ENCOUNTER — Telehealth: Payer: Self-pay | Admitting: Internal Medicine

## 2019-09-30 DIAGNOSIS — R944 Abnormal results of kidney function studies: Secondary | ICD-10-CM

## 2019-09-30 DIAGNOSIS — E119 Type 2 diabetes mellitus without complications: Secondary | ICD-10-CM

## 2019-09-30 LAB — IRON,TIBC AND FERRITIN PANEL
%SAT: 19 % (calc) (ref 16–45)
Ferritin: 37 ng/mL (ref 16–288)
Iron: 58 ug/dL (ref 45–160)
TIBC: 302 mcg/dL (calc) (ref 250–450)

## 2019-09-30 LAB — HEPATITIS C ANTIBODY
Hepatitis C Ab: NONREACTIVE
SIGNAL TO CUT-OFF: 0.01 (ref <1.00)

## 2019-09-30 LAB — INTRINSIC FACTOR ANTIBODIES: Intrinsic Factor: NEGATIVE

## 2019-09-30 MED ORDER — METFORMIN HCL 850 MG PO TABS: 850.0000 mg | ORAL_TABLET | Freq: Two times a day (BID) | ORAL | 0 refills | Status: DC

## 2019-09-30 NOTE — Telephone Encounter (Signed)
Spoke with pt and she is aware that medication has been changed to 850mg  and sent to [facility].

## 2019-09-30 NOTE — Telephone Encounter (Signed)
Dose reduced and sent

## 2019-09-30 NOTE — Telephone Encounter (Signed)
Pt would like a refill for Metformin. She would like it changed from 1000 to 850mg . Pt uses [facility] on [address]. Thanks!

## 2019-10-16 ENCOUNTER — Telehealth: Payer: Self-pay | Admitting: Internal Medicine

## 2019-10-16 NOTE — Telephone Encounter (Signed)
Pt called to schedule lab and office visit. Pt states that she needs some clarification on whether she needs to come in office and also do fasting labwork. Pt did not want me to schedule until she spoke with you or Dr Derrel Nip. Please advise. Thanks!

## 2019-10-17 NOTE — Telephone Encounter (Signed)
I called and explained to patient that she only needed f/u lab appointment to check kidney function. We wanted to make sure it had come up enough to be stable. If it was still abnormal then she would need appointment to see nephrologist. I advised also that she make sure that she stays hydrated before lab appointment. I have scheduled for her in one week.

## 2019-10-24 ENCOUNTER — Other Ambulatory Visit (INDEPENDENT_AMBULATORY_CARE_PROVIDER_SITE_OTHER): Payer: Medicare Other

## 2019-10-24 ENCOUNTER — Other Ambulatory Visit: Payer: Self-pay

## 2019-10-24 DIAGNOSIS — R944 Abnormal results of kidney function studies: Secondary | ICD-10-CM | POA: Diagnosis not present

## 2019-10-25 ENCOUNTER — Telehealth: Payer: Self-pay | Admitting: Internal Medicine

## 2019-10-25 ENCOUNTER — Other Ambulatory Visit: Payer: Self-pay | Admitting: Internal Medicine

## 2019-10-25 DIAGNOSIS — I10 Essential (primary) hypertension: Secondary | ICD-10-CM

## 2019-10-25 DIAGNOSIS — R944 Abnormal results of kidney function studies: Secondary | ICD-10-CM

## 2019-10-25 LAB — BASIC METABOLIC PANEL
BUN: 29 mg/dL — ABNORMAL HIGH (ref 6–23)
CO2: 28 mEq/L (ref 19–32)
Calcium: 9.6 mg/dL (ref 8.4–10.5)
Chloride: 103 mEq/L (ref 96–112)
Creatinine, Ser: 1.82 mg/dL — ABNORMAL HIGH (ref 0.40–1.20)
GFR: 33.23 mL/min — ABNORMAL LOW (ref >60.00)
Glucose, Bld: 150 mg/dL — ABNORMAL HIGH (ref 70–99)
Potassium: 4.2 mEq/L (ref 3.5–5.1)
Sodium: 138 mEq/L (ref 135–145)

## 2019-10-25 MED ORDER — AMLODIPINE BESYLATE 5 MG PO TABS: 5.0000 mg | ORAL_TABLET | Freq: Every day | ORAL | 3 refills | Status: DC

## 2019-10-25 NOTE — Telephone Encounter (Signed)
Pt wants to know if she should stop drinking ginger juice and "sea moss"? Please advise

## 2019-10-28 NOTE — Telephone Encounter (Signed)
I HAVE NO IDEA WHAT THOSE ARE. IF SHE WANT TO DISCUSS TH EUSE OF WEIRD  DIETARY THINGS SHE NEEDS TO MAKE AN APPT AND HAVE THOSE IDTESM ON HAND TO EVALUATE

## 2019-10-30 NOTE — Telephone Encounter (Signed)
Spoke with pt and she stated that she would hold off on making an appt at this time until she sees what her lab work looks like.

## 2019-10-31 ENCOUNTER — Other Ambulatory Visit (INDEPENDENT_AMBULATORY_CARE_PROVIDER_SITE_OTHER): Payer: Medicare Other

## 2019-10-31 ENCOUNTER — Other Ambulatory Visit: Payer: Self-pay

## 2019-10-31 DIAGNOSIS — R944 Abnormal results of kidney function studies: Secondary | ICD-10-CM | POA: Diagnosis not present

## 2019-10-31 LAB — BASIC METABOLIC PANEL
BUN: 30 mg/dL — ABNORMAL HIGH (ref 6–23)
CO2: 28 mEq/L (ref 19–32)
Calcium: 9.8 mg/dL (ref 8.4–10.5)
Chloride: 102 mEq/L (ref 96–112)
Creatinine, Ser: 1.4 mg/dL — ABNORMAL HIGH (ref 0.40–1.20)
GFR: 44.98 mL/min — ABNORMAL LOW (ref >60.00)
Glucose, Bld: 164 mg/dL — ABNORMAL HIGH (ref 70–99)
Potassium: 3.9 mEq/L (ref 3.5–5.1)
Sodium: 138 mEq/L (ref 135–145)

## 2019-11-02 ENCOUNTER — Other Ambulatory Visit: Payer: Self-pay | Admitting: Internal Medicine

## 2019-11-02 DIAGNOSIS — N1831 Chronic kidney disease, stage 3a: Secondary | ICD-10-CM

## 2019-11-04 ENCOUNTER — Telehealth: Payer: Self-pay | Admitting: Internal Medicine

## 2019-11-04 NOTE — Telephone Encounter (Signed)
Pt wants to know if it is safe for her to get the covid vaccine considering the state of her kidneys. Please advise.

## 2019-11-04 NOTE — Telephone Encounter (Signed)
FYI

## 2019-11-05 ENCOUNTER — Other Ambulatory Visit: Payer: Self-pay | Admitting: Internal Medicine

## 2019-11-05 DIAGNOSIS — N1831 Chronic kidney disease, stage 3a: Secondary | ICD-10-CM

## 2019-12-13 ENCOUNTER — Other Ambulatory Visit: Payer: Self-pay | Admitting: Nephrology

## 2019-12-13 DIAGNOSIS — E1122 Type 2 diabetes mellitus with diabetic chronic kidney disease: Secondary | ICD-10-CM

## 2019-12-13 DIAGNOSIS — I10 Essential (primary) hypertension: Secondary | ICD-10-CM | POA: Diagnosis not present

## 2019-12-13 DIAGNOSIS — N1832 Chronic kidney disease, stage 3b: Secondary | ICD-10-CM

## 2019-12-19 ENCOUNTER — Other Ambulatory Visit: Payer: Self-pay

## 2019-12-19 ENCOUNTER — Ambulatory Visit
Admission: RE | Admit: 2019-12-19 | Discharge: 2019-12-19 | Disposition: A | Discharge status: Home / Self Care | Payer: Medicare Other | Source: Ambulatory Visit | Attending: Nephrology | Admitting: Nephrology

## 2019-12-19 DIAGNOSIS — E1122 Type 2 diabetes mellitus with diabetic chronic kidney disease: Secondary | ICD-10-CM | POA: Insufficient documentation

## 2019-12-19 DIAGNOSIS — N189 Chronic kidney disease, unspecified: Secondary | ICD-10-CM | POA: Diagnosis not present

## 2019-12-19 DIAGNOSIS — N200 Calculus of kidney: Secondary | ICD-10-CM | POA: Diagnosis not present

## 2019-12-19 DIAGNOSIS — N1832 Chronic kidney disease, stage 3b: Secondary | ICD-10-CM | POA: Insufficient documentation

## 2020-01-02 ENCOUNTER — Telehealth: Payer: Self-pay | Admitting: Internal Medicine

## 2020-01-02 DIAGNOSIS — E119 Type 2 diabetes mellitus without complications: Secondary | ICD-10-CM

## 2020-01-02 MED ORDER — METFORMIN HCL 850 MG PO TABS: 850.0000 mg | ORAL_TABLET | Freq: Two times a day (BID) | ORAL | 0 refills | Status: DC

## 2020-01-02 NOTE — Progress Notes (Signed)
[facility]  [address] [place], [place] [address] Phone: [phone]  Fax: [phone]   Clinic Day:  01/06/2020  Referring physician: Anthonette Legato, MD  Chief Complaint: Jeanette Spencer is a 69 y.o. female with stage IIIb chronic kidney disease who is referred in consultation by Dr. Anthonette Legato for an abnormal SPEP/UPEP and anemia.   HPI:  The patient was referred in consultation to Dr. Holley Raring on 12/13/2019. She has had fluctuating renal function. Creatinine was 1.3 (GFR 59m/min) on 09/26/2019, 1.82 (GFR 33 ml/min) on 10/24/2019, 1.4 (GFR 44 ml/min) on 11/07/2019.  Risk factors for CKD included diabetes, hypertension, and intermittent NSAID use.  A laboratory work-up was ordered.   Labs included a hematocrit 34.9, hemoglobin 10.9, MCV 82.5, platelets 310,000, WBC 9,900. Creatinine was 1.18. UPC ratio was 438 (range 21-161 mg/g creat). Protein/creatinine ratio was 0.438 (range 0.021 - 0.161 mg/mg creat). Protein urine random was 39 (range 5-24 mg/dL). ANA was positive (titer 1:80).  SPEP revealed a poorly defined band of restricted protein mobility in the gamma globulins.  Random UPEP revealed a possible abnormal protein band in the alpha2 region felt most likely due to hemoglobin.  Renal ultrasound on 12/19/2019 revealed nonobstructing left renal stone. There were no other focal abnormality  Ferritin has been followed:  56.9 on 04/11/2013 and 37 on 09/26/2019.  Iron saturation was 19% with a TIBC of 302 on 09/25/2020.  B12 was 155 on 04/11/2013 and 551 on 09/26/2019.  Symptomatically, she feels good. She reports that she has been anemic since she was 69year old. She had heavy periods when she was younger. Her diet was good and she craved ice. She was on oral iron with her first child, but she stopped because it made her feel sick. She reports that she switched to slow Fe. She has no been on oral iron for the past 43 years. She reports previously  giving herself B12 injections; she stopped 3 years ago. She was on oral B12 1,000 mcg back in 08/2019 and stopped in 09/2019.   Her diet consist of ground t[place]meat, dark leafy green vegetables, and baked chicken. She stopped eating hamburger 3 years ago. She denies ice pica. She has no restless legs.   She denies any B symptoms. She reports having allergy symptoms and feels stopped up in the morning. She has no abdominal symptoms. She reports that she has kidney stones and denies any pains. She has knee pain secondary to arthritis. She reports right lower extremity swelling. She feels off balance secondary to Lipitor.    Colonoscopy on 09/24/2018 by Dr WAllen Norrisrevealed a 4 mm polyp (hyperplastic polyp) in the sigmoid colon and diverticulosis in the sigmoid colon.  She had her last COVID-19 vaccine 2 weeks ago (Moderna). With the first shot, she had arm swelling. With the second shot, she reported no symptoms.    Past Medical History:  Diagnosis Date  . Allergy   . Arthritis    knees  . Chicken pox   . Chronic bronchitis ([place]   . Diabetes mellitus without complication ([place]    type 2  . Hypertension   . Phlebitis   . 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 ABDOMINAL HYSTERECTOMY    . abdominal tumor Left    size of grapefruit  . COLONOSCOPY WITH PROPOFOL N/A 09/24/2018   Procedure: COLONOSCOPY WITH BIOPSY;  Surgeon: WAllen Norris  Darren, MD;  Location: [place];  Service: Endoscopy;  Laterality: N/A;  diabetic - oral meds  requests arrival after 8:30  . POLYPECTOMY N/A 09/24/2018   Procedure: POLYPECTOMY;  Surgeon: [name], MD;  Location: [place];  Service: Endoscopy;  Laterality: N/A;  . TONSILECTOMY, ADENOIDECTOMY, BILATERAL MYRINGOTOMY AND TUBES      Family History  Problem Relation Age of Onset  . Diabetes Mother   . Cancer Father   . Cancer Paternal Aunt   . Cancer  Paternal Uncle   . Diabetes Paternal Grandmother   Her father had prostate cancer. Her paternal aunt had breast cancer and her uncle had cancer.    Social History:  reports that she has never smoked. She has never used smokeless tobacco. She reports current alcohol use. She reports that she does not use drugs. 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 She denies any exposure to radiation or toxins. She lives in [place].  The patient is alone today.  Allergies:  Allergies  Allergen Reactions  . Oxycodone   . Glipizide     Recurrent hypoglycemia  . Lipitor [Atorvastatin]     Affected balance    Current Medications: Current Outpatient Medications  Medication Sig Dispense Refill  . amLODipine (NORVASC) 5 MG tablet Take 1 tablet (5 mg total) by mouth daily. 90 tablet 3  . atorvastatin (LIPITOR) 20 MG tablet Take 1 tablet (20 mg total) by mouth daily. 90 tablet 3  . COD LIVER OIL PO Take by mouth.    [name] glucose blood test strip Use as instructed to check sugars twice daily.  Uncontrolled diabetes mellitus E11.65 100 each 12  . metFORMIN (GLUCOPHAGE) 850 MG tablet Take 1 tablet (850 mg total) by mouth 2 (two) times daily with a meal. 180 tablet 0  . Multiple Vitamin (MULTIVITAMIN) tablet Take 1 tablet by mouth daily.    . ciprofloxacin (CIPRO) 250 MG tablet Take 1 tablet (250 mg total) by mouth 2 (two) times daily. 6 tablet 0   No current facility-administered medications for this visit.    Review of Systems  Constitutional: Negative.  Negative for chills, diaphoresis, fever, malaise/fatigue and weight loss.       Feels "good".  HENT: Negative.  Negative for congestion, ear discharge, ear pain, hearing loss, nosebleeds, sinus pain and sore throat.   Eyes: Negative.  Negative for blurred vision, double vision and photophobia.  Respiratory: Negative.  Negative for cough, hemoptysis, sputum production and shortness of breath.   Cardiovascular: Positive for leg swelling (RLE). Negative  for chest pain and palpitations.  Gastrointestinal: Negative.  Negative for abdominal pain, blood in stool, constipation, diarrhea, heartburn, melena, nausea and vomiting.       No ice pica. Good appetite. Eating well.  Genitourinary: Negative for dysuria, frequency, hematuria and urgency.       H/o kidney stones.  Musculoskeletal: Positive for joint pain (bilateral knees). Negative for back pain, myalgias and neck pain.  Skin: Negative.  Negative for itching and rash.  Neurological: Negative for dizziness, tingling, sensory change, focal weakness, weakness and headaches.       No restless legs.  Feels off balance on Lipitor.  Endo/Heme/Allergies: Positive for environmental allergies. Does not bruise/bleed easily.  Psychiatric/Behavioral: Negative for depression and memory loss. The patient is not nervous/anxious and does not have insomnia.   All other systems reviewed and are negative.  Performance status (ECOG): 0  Vitals Blood pressure (!) 145/69, pulse 84, temperature (!)  96.9 F (36.1 C), temperature source Tympanic, resp. rate 18, height 4' 11.75" (1.518 m), weight 187 lb 15.1 oz (85.2 kg), SpO2 100 %.   Physical Exam  Constitutional: She is oriented to person, place, and time. She appears well-developed and well-nourished. No distress.  HENT:  Head: Normocephalic and atraumatic.  Mouth/Throat: Oropharynx is clear and moist. No oropharyngeal exudate.  Short styled white hair.  Mask.  Eyes: Pupils are equal, round, and reactive to light. Conjunctivae and EOM are normal. No scleral icterus.  Brown eyes.  Neck: No JVD present.  Cardiovascular: Normal rate, regular rhythm and normal heart sounds.  No murmur heard. Pulmonary/Chest: Effort normal and breath sounds normal. No respiratory distress. She has no wheezes. She has no rales. She exhibits no tenderness.  Abdominal: Soft. Bowel sounds are normal. She exhibits no distension and no mass. There is no abdominal tenderness. There is  no rebound and no guarding.  Musculoskeletal:        General: Edema (trace BLE) present. No tenderness. Normal range of motion.     Cervical back: Normal range of motion and neck supple.  Lymphadenopathy:    She has no cervical adenopathy.    She has no axillary adenopathy.       Right: No supraclavicular adenopathy present.       Left: No supraclavicular adenopathy present.  Neurological: She is alert and oriented to person, place, and time.  Skin: Skin is warm and dry. No rash noted. She is not diaphoretic. No erythema. No pallor.  Psychiatric: She has a normal mood and affect. Her behavior is normal. Judgment and thought content normal.  Nursing note and vitals reviewed.   No visits with results within 3 Day(s) from this visit.  Latest known visit with results is:  Lab on 10/31/2019  Component Date Value Ref Range Status  . Sodium 10/31/2019 138  135 - 145 mEq/L Final  . Potassium 10/31/2019 3.9  3.5 - 5.1 mEq/L Final  . Chloride 10/31/2019 102  96 - 112 mEq/L Final  . CO2 10/31/2019 28  19 - 32 mEq/L Final  . Glucose, Bld 10/31/2019 164* 70 - 99 mg/dL Final  . BUN 10/31/2019 30* 6 - 23 mg/dL Final  . Creatinine, Ser 10/31/2019 1.40* 0.40 - 1.20 mg/dL Final  . GFR 10/31/2019 44.98* >60.00 mL/min Final  . Calcium 10/31/2019 9.8  8.4 - 10.5 mg/dL Final    Assessment:  Jeanette Spencer is a 69 y.o. female with stage IIIb chronic kidney disease and abnormal SPEP/UPEP.   Labs on 12/13/2019 revealed a hematocrit 34.9, hemoglobin 10.9, MCV 82.5, platelets 310,000, WBC 9,900. Creatinine was 1.18. SPEP revealed a poorly defined band of restricted protein mobility in the gamma globulins.  Random UPEP revealed a possible abnormal protein band in the alpha2 region felt most likely due to hemoglobin.  She has a mild normocytic anemia.  Ferritin has been followed:  56.9 on 04/11/2013 and 37 on 09/26/2019.    Colonoscopy on 09/24/2018 revealed a 4 mm polyp (hyperplastic polyp) in the sigmoid  colon and diverticulosis in the sigmoid colon.  She has B12 deficiency.  B12 was 155 on 04/11/2013 and 551 on 09/26/2019.  She previously gave herself B12 injections; she stopped 3 years ago. She stopped oral B12 in 09/2019.   She had her last COVID-19 vaccine 2 weeks ago (Moderna).  Symptomatically, she feels good.  She denies any melena, hematochezia or hematuria.  Plan: 1.   Labs today:  CBC with diff, retic,  myeloma panel, FLCA, retic, ferritin, iron studies, B12, folate. 2.   24 hour urine for UPEP and free light chains. 3.   Abnormal SPEP and UPEP   Etiology unclear.  Discuss monoclonal gammopathy or unknown significance as well as lymphoproliferative disorders (multiple myeloma and lymphomas).  Discuss plans for additional work-up (bblood and urine). 4.   Mild normocytic anemia  Etiology may be multi-factorial.  Work-up reviewed. 5.   B12 deficiency  Patient has a history of B12 deficiency.  She has been off B12 since 09/2019.  Recheck B12 and folate. 6.   RTC in 2 weeks for MD assessment, review of labs, and discussion regarding direction of therapy.  I discussed the assessment and treatment plan with the patient.  The patient was provided an opportunity to ask questions and all were answered.  The patient agreed with the plan and demonstrated an understanding of the instructions.  The patient was advised to call back if the symptoms worsen or if the condition fails to improve as anticipated.  I provided 21 minutes of face-to-face time during this this encounter and > 50% was spent counseling as documented under my assessment and plan.  An additional 10 minutes were spent reviewing her chart (Epic and Care Everywhere) including notes, labs, and imaging studies.    Talynn Lebon C. Mike Gip, MD, PhD    01/06/2020, 11:00 AM  I, Selena Batten, am acting as scribe for [employer]. Mike Gip, MD, PhD.  I, Billy Turvey C. Mike Gip, MD, have reviewed the above documentation for accuracy and  completeness, and I agree with the above.

## 2020-01-02 NOTE — Telephone Encounter (Signed)
Pt needs a refill on metFORMIN (GLUCOPHAGE) 850 MG tablet. Pt has four left.

## 2020-01-06 ENCOUNTER — Inpatient Hospital Stay: Payer: Medicare Other | Attending: Hematology and Oncology | Admitting: Hematology and Oncology

## 2020-01-06 ENCOUNTER — Ambulatory Visit: Payer: Medicare Other

## 2020-01-06 ENCOUNTER — Encounter: Payer: Self-pay | Admitting: Hematology and Oncology

## 2020-01-06 ENCOUNTER — Other Ambulatory Visit: Payer: Self-pay

## 2020-01-06 VITALS — BP 145/69 | HR 84 | Temp 96.9°F | Resp 18 | Ht 59.75 in | Wt 187.9 lb

## 2020-01-06 DIAGNOSIS — K5730 Diverticulosis of large intestine without perforation or abscess without bleeding: Secondary | ICD-10-CM | POA: Insufficient documentation

## 2020-01-06 DIAGNOSIS — Z803 Family history of malignant neoplasm of breast: Secondary | ICD-10-CM | POA: Insufficient documentation

## 2020-01-06 DIAGNOSIS — D631 Anemia in chronic kidney disease: Secondary | ICD-10-CM

## 2020-01-06 DIAGNOSIS — Z885 Allergy status to narcotic agent status: Secondary | ICD-10-CM | POA: Diagnosis not present

## 2020-01-06 DIAGNOSIS — M25569 Pain in unspecified knee: Secondary | ICD-10-CM

## 2020-01-06 DIAGNOSIS — E538 Deficiency of other specified B group vitamins: Secondary | ICD-10-CM | POA: Insufficient documentation

## 2020-01-06 DIAGNOSIS — Z86711 Personal history of pulmonary embolism: Secondary | ICD-10-CM

## 2020-01-06 DIAGNOSIS — N200 Calculus of kidney: Secondary | ICD-10-CM | POA: Insufficient documentation

## 2020-01-06 DIAGNOSIS — M7989 Other specified soft tissue disorders: Secondary | ICD-10-CM

## 2020-01-06 DIAGNOSIS — Z833 Family history of diabetes mellitus: Secondary | ICD-10-CM | POA: Insufficient documentation

## 2020-01-06 DIAGNOSIS — R110 Nausea: Secondary | ICD-10-CM | POA: Diagnosis not present

## 2020-01-06 DIAGNOSIS — I129 Hypertensive chronic kidney disease with stage 1 through stage 4 chronic kidney disease, or unspecified chronic kidney disease: Secondary | ICD-10-CM | POA: Diagnosis not present

## 2020-01-06 DIAGNOSIS — M1990 Unspecified osteoarthritis, unspecified site: Secondary | ICD-10-CM | POA: Diagnosis not present

## 2020-01-06 DIAGNOSIS — Z809 Family history of malignant neoplasm, unspecified: Secondary | ICD-10-CM | POA: Insufficient documentation

## 2020-01-06 DIAGNOSIS — D649 Anemia, unspecified: Secondary | ICD-10-CM | POA: Insufficient documentation

## 2020-01-06 DIAGNOSIS — E1122 Type 2 diabetes mellitus with diabetic chronic kidney disease: Secondary | ICD-10-CM | POA: Insufficient documentation

## 2020-01-06 DIAGNOSIS — N1832 Chronic kidney disease, stage 3b: Secondary | ICD-10-CM | POA: Diagnosis not present

## 2020-01-06 DIAGNOSIS — Z79899 Other long term (current) drug therapy: Secondary | ICD-10-CM | POA: Diagnosis not present

## 2020-01-06 DIAGNOSIS — D125 Benign neoplasm of sigmoid colon: Secondary | ICD-10-CM | POA: Insufficient documentation

## 2020-01-06 DIAGNOSIS — R778 Other specified abnormalities of plasma proteins: Secondary | ICD-10-CM

## 2020-01-06 LAB — CBC WITH DIFFERENTIAL/PLATELET
Abs Immature Granulocytes: 0.04 10*3/uL (ref 0.00–0.07)
Basophils Absolute: 0.1 10*3/uL (ref 0.0–0.1)
Basophils Relative: 1 %
Eosinophils Absolute: 0.2 10*3/uL (ref 0.0–0.5)
Eosinophils Relative: 2 %
HCT: 35.7 % — ABNORMAL LOW (ref 36.0–46.0)
Hemoglobin: 11.3 g/dL — ABNORMAL LOW (ref 12.0–15.0)
Immature Granulocytes: 0 %
Lymphocytes Relative: 27 %
Lymphs Abs: 2.7 10*3/uL (ref 0.7–4.0)
MCH: 26.3 pg (ref 26.0–34.0)
MCHC: 31.7 g/dL (ref 30.0–36.0)
MCV: 83.2 fL (ref 80.0–100.0)
Monocytes Absolute: 0.5 10*3/uL (ref 0.1–1.0)
Monocytes Relative: 5 %
Neutro Abs: 6.5 10*3/uL (ref 1.7–7.7)
Neutrophils Relative %: 65 %
Platelets: 306 10*3/uL (ref 150–400)
RBC: 4.29 MIL/uL (ref 3.87–5.11)
RDW: 14.9 % (ref 11.5–15.5)
WBC: 10 10*3/uL (ref 4.0–10.5)
nRBC: 0 % (ref 0.0–0.2)

## 2020-01-06 LAB — FOLATE: Folate: 12.1 ng/mL (ref >5.9)

## 2020-01-06 LAB — VITAMIN B12: Vitamin B-12: 262 pg/mL (ref 180–914)

## 2020-01-06 LAB — RETICULOCYTES
Immature Retic Fract: 17.3 % — ABNORMAL HIGH (ref 2.3–15.9)
RBC.: 4.28 MIL/uL (ref 3.87–5.11)
Retic Count, Absolute: 65.5 10*3/uL (ref 19.0–186.0)
Retic Ct Pct: 1.5 % (ref 0.4–3.1)

## 2020-01-06 LAB — IRON AND TIBC
Iron: 61 ug/dL (ref 28–170)
Saturation Ratios: 20 % (ref 10.4–31.8)
TIBC: 308 ug/dL (ref 250–450)
UIBC: 247 ug/dL

## 2020-01-06 LAB — FERRITIN: Ferritin: 31 ng/mL (ref 11–307)

## 2020-01-07 ENCOUNTER — Other Ambulatory Visit: Payer: Self-pay

## 2020-01-07 DIAGNOSIS — E538 Deficiency of other specified B group vitamins: Secondary | ICD-10-CM

## 2020-01-07 LAB — KAPPA/LAMBDA LIGHT CHAINS
Kappa free light chain: 55.1 mg/L — ABNORMAL HIGH (ref 3.3–19.4)
Kappa, lambda light chain ratio: 1.81 — ABNORMAL HIGH (ref 0.26–1.65)
Lambda free light chains: 30.4 mg/L — ABNORMAL HIGH (ref 5.7–26.3)

## 2020-01-08 ENCOUNTER — Telehealth: Payer: Self-pay

## 2020-01-08 DIAGNOSIS — I129 Hypertensive chronic kidney disease with stage 1 through stage 4 chronic kidney disease, or unspecified chronic kidney disease: Secondary | ICD-10-CM | POA: Diagnosis not present

## 2020-01-08 DIAGNOSIS — E1122 Type 2 diabetes mellitus with diabetic chronic kidney disease: Secondary | ICD-10-CM | POA: Diagnosis not present

## 2020-01-08 DIAGNOSIS — E538 Deficiency of other specified B group vitamins: Secondary | ICD-10-CM | POA: Diagnosis not present

## 2020-01-08 DIAGNOSIS — N1832 Chronic kidney disease, stage 3b: Secondary | ICD-10-CM | POA: Diagnosis not present

## 2020-01-08 DIAGNOSIS — N200 Calculus of kidney: Secondary | ICD-10-CM | POA: Diagnosis not present

## 2020-01-08 DIAGNOSIS — D631 Anemia in chronic kidney disease: Secondary | ICD-10-CM | POA: Diagnosis not present

## 2020-01-08 NOTE — Telephone Encounter (Signed)
Patient aware of b12 results and she has appointment to return for lab work in one month to recheck levels.

## 2020-01-08 NOTE — Telephone Encounter (Signed)
-----   Message from Lequita Asal, MD sent at 01/07/2020 10:47 AM EDT ----- Regarding: RE: Please call patient.  B12 is low.  Please let her know that sounds good (B12 1000 mcg a day). Please schedule f/u B12 in 1 month.  If low, will begin B12 injections.  M ----- Message ----- From: Drue Dun, RN Sent: 01/07/2020   9:56 AM EDT To: Arlan Organ, RN, # Subject: RE: Please call patient.  B12 is low.          Patient states that she was previously taking oral b12 until she finished the bottle and never got anymore. Patient states that she will start back on her oral b12 1,017mcg a day, unless you would like her on a different dose. ----- Message ----- From: Lequita Asal, MD Sent: 01/06/2020   5:27 PM EDT To: Arlan Organ, RN, # Subject: Please call patient.  B12 is low.               Would she like to start oral B12 or B12 injections?  M ----- Message ----- From: Buel Ream, Lab [facility] [place] Sent: 01/06/2020  11:47 AM EDT To: Lequita Asal, MD

## 2020-01-09 ENCOUNTER — Other Ambulatory Visit: Payer: Self-pay

## 2020-01-09 DIAGNOSIS — E538 Deficiency of other specified B group vitamins: Secondary | ICD-10-CM

## 2020-01-09 DIAGNOSIS — R778 Other specified abnormalities of plasma proteins: Secondary | ICD-10-CM

## 2020-01-09 LAB — MULTIPLE MYELOMA PANEL, SERUM
Albumin SerPl Elph-Mcnc: 3.8 g/dL (ref 2.9–4.4)
Albumin/Glob SerPl: 1.2 (ref 0.7–1.7)
Alpha 1: 0.2 g/dL (ref 0.0–0.4)
Alpha2 Glob SerPl Elph-Mcnc: 0.9 g/dL (ref 0.4–1.0)
B-Globulin SerPl Elph-Mcnc: 1.2 g/dL (ref 0.7–1.3)
Gamma Glob SerPl Elph-Mcnc: 1.1 g/dL (ref 0.4–1.8)
Globulin, Total: 3.4 g/dL (ref 2.2–3.9)
IgA: 231 mg/dL (ref 87–352)
IgG (Immunoglobin G), Serum: 1063 mg/dL (ref 586–1602)
IgM (Immunoglobulin M), Srm: 114 mg/dL (ref 26–217)
Total Protein ELP: 7.2 g/dL (ref 6.0–8.5)

## 2020-01-10 LAB — UPEP/TP, 24-HR URINE
Albumin, U: 28.3 %
Alpha 1, Urine: 6.3 %
Alpha 2, Urine: 10.9 %
Beta, Urine: 40.6 %
Gamma Globulin, Urine: 13.9 %
Total Protein, Urine-Ur/day: 279 mg/24 hr — ABNORMAL HIGH (ref 30–150)
Total Protein, Urine: 12.4 mg/dL
Total Volume: 2250

## 2020-01-10 LAB — FREE K+L LT CHAINS,QN,UR
Free Kappa Lt Chains,Ur: 80.46 mg/L (ref 0.63–113.79)
Free Kappa/Lambda Ratio: 19.11 (ref 1.03–31.76)
Free Lambda Lt Chains,Ur: 4.21 mg/L (ref 0.47–11.77)
Total Volume: 2250

## 2020-01-19 ENCOUNTER — Encounter: Payer: Self-pay | Admitting: Hematology and Oncology

## 2020-01-19 ENCOUNTER — Other Ambulatory Visit: Payer: Self-pay

## 2020-01-19 NOTE — Progress Notes (Signed)
No new changes noted today. The patient Name and DOB has been verified by phone today. 

## 2020-01-19 NOTE — Progress Notes (Signed)
[facility]  [address] [place], [place] [address] Phone: [phone]  Fax: [phone]   Clinic Day:  01/20/2020  Referring physician: [name], MD  Chief Complaint: Jeanette Spencer is a 69 y.o. female with stage IIIb chronic kidney disease who is seen for a 2 week assessment, review of work up and discussion regarding direction of therapy.   HPI:  The patient was last seen in the hematology clinic on 01/06/2020 for an initial consult. At that time, she felt good. She denied any melena, hematochezia or hematuria.  Work up revealed hematocrit 35.7, hemoglobin 11.3, MCV 83.2, platelets 306,000, WBC 10,000. Ferritin was 31 with iron saturation of 20% and a TIBC of 308. Kappa free light chain 55.1, lambda free light chain 30.4 with ratio of 1.81. SPEP was normal. Retic was 1.5%.  Vitamin B12 was 262 (low) and folate 12.1.   24 hour urine for UPEP and free light chains on 01/08/2020 was normal.  During the interim, she has been feeling "pretty good". She has been taking oral B12 1,000 mcg gummies since 01/09/2020. Patient will see Dr. Holley Raring on 01/24/2020. She notes no new changes since last visit.     Past Medical History:  Diagnosis Date  . Allergy   . Arthritis    knees  . Chicken pox   . Chronic bronchitis ([place])   . Diabetes mellitus without complication ([place])    type 2  . Hypertension   . Phlebitis   . Pulmonary embolism ([place]) 1990   after abdominal tumor removal    Past Surgical History:  Procedure Laterality Date  . ABDOMINAL HYSTERECTOMY    . abdominal tumor Left    size of grapefruit  . COLONOSCOPY WITH PROPOFOL N/A 09/24/2018   Procedure: COLONOSCOPY WITH BIOPSY;  Surgeon: [name], MD;  Location: [facility];  Service: Endoscopy;  Laterality: N/A;  diabetic - oral meds  requests arrival after 8:30  . POLYPECTOMY N/A 09/24/2018   Procedure: POLYPECTOMY;  Surgeon: [name], MD;  Location: [facility];  Service: Endoscopy;  Laterality: N/A;  . TONSILECTOMY, ADENOIDECTOMY, BILATERAL MYRINGOTOMY AND TUBES      Family History  Problem Relation Age of Onset  . Diabetes Mother   . Cancer Father   . Cancer Paternal Aunt   . Cancer Paternal Uncle   . Diabetes Paternal Grandmother   Her father had prostate cancer. Her paternal aunt had breast cancer and her uncle had cancer.    Social History:  reports that she has never smoked. She has never used smokeless tobacco. She reports current alcohol use. She reports that she does not use drugs. 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 She denies any exposure to radiation or toxins. She lives in [place].  The patient is alone today.  Allergies:  Allergies  Allergen Reactions  . Oxycodone   . Glipizide     Recurrent hypoglycemia  . Lipitor [Atorvastatin]     Affected balance    Current Medications: Current Outpatient Medications  Medication Sig Dispense Refill  . amLODipine (NORVASC) 5 MG tablet Take 1 tablet (5 mg total) by mouth daily. 90 tablet 3  . atorvastatin (LIPITOR) 20 MG tablet Take 1 tablet (20 mg total) by mouth daily. 90 tablet 3  . ciprofloxacin (CIPRO) 250 MG tablet Take 1 tablet (250 mg total) by mouth 2 (two) times daily. 6 tablet 0  . COD LIVER OIL PO Take by mouth.    [name]  glucose blood test strip Use as instructed to check sugars twice daily.  Uncontrolled diabetes mellitus E11.65 100 each 12  . metFORMIN (GLUCOPHAGE) 850 MG tablet Take 1 tablet (850 mg total) by mouth 2 (two) times daily with a meal. 180 tablet 0  . Multiple Vitamin (MULTIVITAMIN) tablet Take 1 tablet by mouth daily.     No current facility-administered medications for this visit.    Review of Systems  Constitutional: Negative.  Negative for chills, diaphoresis, fever, malaise/fatigue and weight loss (up 2 lbs).       Feels "pretty good".  HENT: Negative.  Negative for congestion, ear discharge, ear pain, hearing loss, nosebleeds, sinus pain  and sore throat.   Eyes: Negative.  Negative for blurred vision, double vision and photophobia.  Respiratory: Negative.  Negative for cough, hemoptysis, sputum production and shortness of breath.   Cardiovascular: Positive for leg swelling (RLE). Negative for chest pain and palpitations.  Gastrointestinal: Negative.  Negative for abdominal pain, blood in stool, constipation, diarrhea, heartburn, melena, nausea and vomiting.       No ice pica. Good appetite. Eating well.  Genitourinary: Negative for dysuria, frequency, hematuria and urgency.       H/o kidney stones.  Musculoskeletal: Positive for joint pain (bilateral knees). Negative for back pain, myalgias and neck pain.  Skin: Negative.  Negative for itching and rash.  Neurological: Negative for dizziness, tingling, sensory change, focal weakness, weakness and headaches.       No restless legs.  Feels off balance on Lipitor.  Endo/Heme/Allergies: Positive for environmental allergies. Does not bruise/bleed easily.  Psychiatric/Behavioral: Negative for depression and memory loss. The patient is not nervous/anxious and does not have insomnia.   All other systems reviewed and are negative.  Performance status (ECOG): 0  Vitals Blood pressure 128/73, pulse 86, temperature 97.8 F (36.6 C), temperature source Tympanic, resp. rate 18, weight 189 lb 11.3 oz (86 kg), SpO2 97 %.   Physical Exam Vitals and nursing note reviewed.  Constitutional:      General: She is not in acute distress.    Appearance: She is well-developed and well-nourished. She is not diaphoretic.  HENT:     Head: Normocephalic and atraumatic.      Comments: Short styled white hair.  Mask. Eyes:     General: No scleral icterus.    Extraocular Movements: EOM normal.     Conjunctiva/sclera: Conjunctivae normal.     Comments: Glasses. Brown eyes.  Neurological:     Mental Status: She is alert and oriented to person, place, and time.  Psychiatric:        Mood and  Affect: Mood and affect normal.        Behavior: Behavior normal.        Thought Content: Thought content normal.        Judgment: Judgment normal.    No visits with results within 3 Day(s) from this visit.  Latest known visit with results is:  Orders Only on 01/09/2020  Component Date Value Ref Range Status  . Free Kappa Lt Chains,Ur 01/08/2020 80.46  0.63 - 113.79 mg/L Final  . Free Lambda Lt Chains,Ur 01/08/2020 4.21  0.47 - 11.77 mg/L Final  . Free Kappa/Lambda Ratio 01/08/2020 19.11  1.03 - 31.76 Final   Comment: (NOTE) Performed At: [facility] [place], [place] [id number] Rush Farmer MD [id number]   . Total Volume 01/08/2020 2,250   Final   Performed at [facility], [place]  [address], [place] [address]  . Total Protein, Urine 01/08/2020 12.4  Not Estab. mg/dL Final  . Total Protein, Urine-Ur/day 01/08/2020 279* 30 - 150 mg/24 hr Final  . Albumin, U 01/08/2020 28.3  % Final  . Alpha 1, Urine 01/08/2020 6.3  % Final  . Alpha 2, Urine 01/08/2020 10.9  % Final  . Beta, Urine 01/08/2020 40.6  % Final  . Gamma Globulin, Urine 01/08/2020 13.9  % Final  . M-spike, % 01/08/2020 Not Observed  Not Observed % Final  . Please Note: 01/08/2020 Comment   Final   Comment: (NOTE) Protein electrophoresis scan will follow via computer, mail, or courier delivery. Performed At: [facility] [place], [place] [id number] Rush Farmer MD [id number]   . Total Volume 01/08/2020 2,250   Final   Performed at [facility], [address], [place], [place] [address]    Assessment:  Jeanette Spencer is a 69 y.o. female with stage IIIb chronic kidney disease and abnormal SPEP/UPEP.   Labs on 12/13/2019 revealed a hematocrit 34.9, hemoglobin 10.9, MCV 82.5, platelets 310,000, WBC 9,900. Creatinine was 1.18. SPEP revealed a poorly defined band of restricted protein mobility in the gamma globulins.  Random UPEP  revealed a possible abnormal protein band in the alpha2 region felt most likely due to hemoglobin.  Work up on 01/06/2020 revealed hematocrit 35.7, hemoglobin 11.3, MCV 83.2, platelets 306,000, WBC 10,000.  Ferritin was 31 with iron saturation of 20% and a TIBC of 308. Kappa free light chain 55.1, lambda free light chain 30.4 with ratio of 1.81 (0.26-1.65). SPEP was normal. Retic was 1.5%.  Vitamin B12 was 262 (low) and folate 12.1.  24 hour urine for UPEP and free light chanis on 01/08/2020 was normal.  She has a mild normocytic anemia.  Ferritin has been followed:  56.9 on 04/11/2013 and 37 on 09/26/2019.    Colonoscopy on 09/24/2018 revealed a 4 mm polyp (hyperplastic polyp) in the sigmoid colon and diverticulosis in the sigmoid colon.  She has B12 deficiency.  B12 was 155 on 04/11/2013 and 551 on 09/26/2019.  She previously gave herself B12 injections; she stopped 3 years ago. She stopped oral B12 in 09/2019.   She had her last COVID-19 vaccine 2 weeks ago (Moderna).  Symptomatically, she feels "good".  She denies any symptoms.  Plan: 1.   Review labs on 01/06/2020. 2.   Abnormal SPEP and UPEP   SPEP was normal.  No monoclonal gammopathy.  Free light chain ratio was 1.81 (slightly elevated) with both kappa and lambda light chains minimally elevated secondary to underlying kidney disease.  24-hour UPEP and with free light chains was negative.  No need for further investigation. 3.   Mild normocytic anemia  Hematocrit 35.7.  Hemoglobin 11.3.  MCV 83.2.    Etiology multi-factorial.  She denies any bleeding.  Ferritin is slightly low.  B12 is low. 4.   B12 deficiency  Patient has a history of B12 deficiency.  She has been off B12 since 09/2019.  B12 was 262 (low).  B12 goal 400.  She has been taking oral B12 2000 mcg a day since 01/09/2020. 5.   RTC on 02/03/2020 for labs (B12). 6.   RTC in 3 months for MD assessment and labs (CBC with diff, ferritin, iron stores).  I discussed the  assessment and treatment plan with the patient.  The patient was provided an opportunity to ask questions and all were answered.  The patient agreed with  the plan and demonstrated an understanding of the instructions.  The patient was advised to call back if the symptoms worsen or if the condition fails to improve as anticipated.    Ural Acree C. Mike Gip, MD, PhD    01/20/2020, 2:03 PM  I, Selena Batten, am acting as scribe for [employer]. Mike Gip, MD, PhD.  I, Darnise Montag C. Mike Gip, MD, have reviewed the above documentation for accuracy and completeness, and I agree with the above.

## 2020-01-20 ENCOUNTER — Other Ambulatory Visit: Payer: Self-pay

## 2020-01-20 ENCOUNTER — Inpatient Hospital Stay (HOSPITAL_BASED_OUTPATIENT_CLINIC_OR_DEPARTMENT_OTHER): Payer: Medicare Other | Admitting: Hematology and Oncology

## 2020-01-20 ENCOUNTER — Encounter: Payer: Self-pay | Admitting: Hematology and Oncology

## 2020-01-20 VITALS — BP 128/73 | HR 86 | Temp 97.8°F | Resp 18 | Wt 189.7 lb

## 2020-01-20 DIAGNOSIS — R778 Other specified abnormalities of plasma proteins: Secondary | ICD-10-CM

## 2020-01-20 DIAGNOSIS — I129 Hypertensive chronic kidney disease with stage 1 through stage 4 chronic kidney disease, or unspecified chronic kidney disease: Secondary | ICD-10-CM | POA: Diagnosis not present

## 2020-01-20 DIAGNOSIS — D649 Anemia, unspecified: Secondary | ICD-10-CM

## 2020-01-20 DIAGNOSIS — E538 Deficiency of other specified B group vitamins: Secondary | ICD-10-CM

## 2020-01-20 NOTE — Progress Notes (Signed)
Pt in for test results and follow up.  Pt denies any new concerns, pt states CMA called yesterday for pre assessment questions and med review. Denies any changes.

## 2020-01-24 DIAGNOSIS — D631 Anemia in chronic kidney disease: Secondary | ICD-10-CM | POA: Diagnosis not present

## 2020-01-24 DIAGNOSIS — E1122 Type 2 diabetes mellitus with diabetic chronic kidney disease: Secondary | ICD-10-CM | POA: Diagnosis not present

## 2020-01-24 DIAGNOSIS — I10 Essential (primary) hypertension: Secondary | ICD-10-CM | POA: Diagnosis not present

## 2020-01-24 DIAGNOSIS — N1832 Chronic kidney disease, stage 3b: Secondary | ICD-10-CM | POA: Diagnosis not present

## 2020-01-31 DIAGNOSIS — E1122 Type 2 diabetes mellitus with diabetic chronic kidney disease: Secondary | ICD-10-CM | POA: Diagnosis not present

## 2020-01-31 DIAGNOSIS — N1832 Chronic kidney disease, stage 3b: Secondary | ICD-10-CM | POA: Diagnosis not present

## 2020-02-03 ENCOUNTER — Telehealth: Payer: Self-pay

## 2020-02-03 ENCOUNTER — Inpatient Hospital Stay: Payer: Medicare (Managed Care) | Attending: Hematology and Oncology

## 2020-02-03 ENCOUNTER — Other Ambulatory Visit: Payer: Self-pay

## 2020-02-03 DIAGNOSIS — R778 Other specified abnormalities of plasma proteins: Secondary | ICD-10-CM | POA: Insufficient documentation

## 2020-02-03 DIAGNOSIS — D649 Anemia, unspecified: Secondary | ICD-10-CM | POA: Insufficient documentation

## 2020-02-03 LAB — IRON AND TIBC
Iron: 50 ug/dL (ref 28–170)
Saturation Ratios: 16 % (ref 10.4–31.8)
TIBC: 309 ug/dL (ref 250–450)
UIBC: 259 ug/dL

## 2020-02-03 LAB — CBC WITH DIFFERENTIAL/PLATELET
Abs Immature Granulocytes: 0.03 10*3/uL (ref 0.00–0.07)
Basophils Absolute: 0 10*3/uL (ref 0.0–0.1)
Basophils Relative: 1 %
Eosinophils Absolute: 0.1 10*3/uL (ref 0.0–0.5)
Eosinophils Relative: 1 %
HCT: 35 % — ABNORMAL LOW (ref 36.0–46.0)
Hemoglobin: 10.7 g/dL — ABNORMAL LOW (ref 12.0–15.0)
Immature Granulocytes: 0 %
Lymphocytes Relative: 32 %
Lymphs Abs: 2.5 10*3/uL (ref 0.7–4.0)
MCH: 25.8 pg — ABNORMAL LOW (ref 26.0–34.0)
MCHC: 30.6 g/dL (ref 30.0–36.0)
MCV: 84.5 fL (ref 80.0–100.0)
Monocytes Absolute: 0.4 10*3/uL (ref 0.1–1.0)
Monocytes Relative: 4 %
Neutro Abs: 4.9 10*3/uL (ref 1.7–7.7)
Neutrophils Relative %: 62 %
Platelets: 301 10*3/uL (ref 150–400)
RBC: 4.14 MIL/uL (ref 3.87–5.11)
RDW: 15.4 % (ref 11.5–15.5)
WBC: 7.9 10*3/uL (ref 4.0–10.5)
nRBC: 0 % (ref 0.0–0.2)

## 2020-02-03 LAB — FERRITIN: Ferritin: 29 ng/mL (ref 11–307)

## 2020-02-03 NOTE — Telephone Encounter (Signed)
-----   Message from Lequita Asal, MD sent at 02/03/2020  3:52 PM EDT ----- Regarding: Please call patient  Ferritin appears to be drifting down c/w iron deficiency.  Begin ferrous sulfate 325 mg (1 tablet) a day with OJ or vitamin C.  M ----- Message ----- From: Interface, Lab In [place] Sent: 02/03/2020  11:26 AM EDT To: Lequita Asal, MD

## 2020-02-03 NOTE — Telephone Encounter (Signed)
No answer / Unable to leave a message , i have tried to reach the patient to inform her Ferritin is appears to be drifting downc/w iron deficiency. She need to start oral Iron with OJ or vitamin C tab daily.

## 2020-02-04 ENCOUNTER — Telehealth: Payer: Self-pay

## 2020-02-04 NOTE — Telephone Encounter (Signed)
Patient states that she just started taking an iron tablet (slow fe). I told her about her lab results per Dr Mike Gip to keep taking 325mg  ferrous sulfate per day with OJ or vitamin C. Patient verbalizes understanding.

## 2020-02-28 DIAGNOSIS — N1832 Chronic kidney disease, stage 3b: Secondary | ICD-10-CM | POA: Diagnosis not present

## 2020-02-28 DIAGNOSIS — I10 Essential (primary) hypertension: Secondary | ICD-10-CM | POA: Diagnosis not present

## 2020-04-14 ENCOUNTER — Telehealth: Payer: Self-pay | Admitting: Internal Medicine

## 2020-04-14 DIAGNOSIS — E538 Deficiency of other specified B group vitamins: Secondary | ICD-10-CM

## 2020-04-14 DIAGNOSIS — I10 Essential (primary) hypertension: Secondary | ICD-10-CM

## 2020-04-14 DIAGNOSIS — E1121 Type 2 diabetes mellitus with diabetic nephropathy: Secondary | ICD-10-CM

## 2020-04-14 DIAGNOSIS — E785 Hyperlipidemia, unspecified: Secondary | ICD-10-CM

## 2020-04-14 DIAGNOSIS — R5383 Other fatigue: Secondary | ICD-10-CM

## 2020-04-14 NOTE — Telephone Encounter (Signed)
Pt would like to have labs drawn before her appt with you on Friday. I have ordered TSH, microalbumin, A1c, CMP, and lipid panel. Is there anything else that needs to be ordered?

## 2020-04-14 NOTE — Telephone Encounter (Signed)
Pt scheduled a telephone visit for Friday afternoon. She would like yearly labs ordered for this week because she starts work on Monday and will not be able to come in after this week for a while. Please advise

## 2020-04-15 ENCOUNTER — Other Ambulatory Visit: Payer: Self-pay

## 2020-04-15 DIAGNOSIS — D649 Anemia, unspecified: Secondary | ICD-10-CM

## 2020-04-17 ENCOUNTER — Telehealth (INDEPENDENT_AMBULATORY_CARE_PROVIDER_SITE_OTHER): Payer: Medicare (Managed Care) | Admitting: Internal Medicine

## 2020-04-17 ENCOUNTER — Other Ambulatory Visit: Payer: Self-pay

## 2020-04-17 DIAGNOSIS — E119 Type 2 diabetes mellitus without complications: Secondary | ICD-10-CM

## 2020-04-17 DIAGNOSIS — D649 Anemia, unspecified: Secondary | ICD-10-CM | POA: Diagnosis not present

## 2020-04-17 DIAGNOSIS — E1121 Type 2 diabetes mellitus with diabetic nephropathy: Secondary | ICD-10-CM

## 2020-04-17 MED ORDER — METFORMIN HCL 500 MG PO TABS: 500.0000 mg | ORAL_TABLET | Freq: Two times a day (BID) | ORAL | 0 refills | Status: DC

## 2020-04-17 NOTE — Progress Notes (Signed)
No new changes noted today. The patient name and DOB has been verified by phone today. 

## 2020-04-17 NOTE — Progress Notes (Signed)
Virtual Visit via [place]  This visit type was conducted due to national recommendations for restrictions regarding the COVID-19 pandemic (e.g. social distancing).  This format is felt to be most appropriate for this patient at this time.  All issues noted in this document were discussed and addressed.  No physical exam was performed (except for noted visual exam findings with Video Visits).   I connected with@ on 04/17/20 at  4:30 PM EDT by telephone  and verified that I am speaking with the correct person using two identifiers. Location patient: home Location provider: work or home office Persons participating in the virtual visit: patient, provider  I discussed the limitations, risks, security and privacy concerns of performing an evaluation and management service by telephone and the availability of in person appointments. I also discussed with the patient that there may be a patient responsible charge related to this service. The patient expressed understanding and agreed to proceed.   Reason for visit: diabetes follow up   HPI:   69 yr old female  With type 2 DM with CKD, hypertension, and anemia of CKD.   Had an episode of "swimmy headed" 2 mornings in a row,  Light headed .  Better now .  Some allergy type symptoms. No headaches or vertigo. Not checking BP . Does not own a machine yet.  Taking 5 mg amlodipine and 25 mg losartan since visit with Dr Holley Raring.     Type 2 DM::  Taking metformin 850 mg bid.  BS have been 130 to 150 fasting   Taking Iron slo Fe every other day  Lab Results  Component Value Date   HGBA1C 7.6 (H) 08/30/2019      ROS: See pertinent positives and negatives per HPI.  Past Medical History:  Diagnosis Date  . Allergy   . Arthritis    knees  . Chicken pox   . Chronic bronchitis ([place])   . Diabetes mellitus without complication ([place])    type 2  . Hypertension   . Phlebitis   . Pulmonary embolism ([place]) 1990   after abdominal tumor removal     Past Surgical History:  Procedure Laterality Date  . ABDOMINAL HYSTERECTOMY    . abdominal tumor Left    size of grapefruit  . COLONOSCOPY WITH PROPOFOL N/A 09/24/2018   Procedure: COLONOSCOPY WITH BIOPSY;  Surgeon: [name], MD;  Location: [facility];  Service: Endoscopy;  Laterality: N/A;  diabetic - oral meds  requests arrival after 8:30  . POLYPECTOMY N/A 09/24/2018   Procedure: POLYPECTOMY;  Surgeon: [name], MD;  Location: [facility];  Service: Endoscopy;  Laterality: N/A;  . TONSILECTOMY, ADENOIDECTOMY, BILATERAL MYRINGOTOMY AND TUBES      Family History  Problem Relation Age of Onset  . Diabetes Mother   . Cancer Father   . Cancer Paternal Aunt   . Cancer Paternal Uncle   . Diabetes Paternal Grandmother     SOCIAL HX:  reports that she has never smoked. She has never used smokeless tobacco. She reports current alcohol use. She reports that she does not use drugs.   Current Outpatient Medications:  .  amLODipine (NORVASC) 5 MG tablet, Take 1 tablet (5 mg total) by mouth daily., Disp: 90 tablet, Rfl: 3 .  atorvastatin (LIPITOR) 20 MG tablet, Take 1 tablet (20 mg total) by mouth daily., Disp: 90 tablet, Rfl: 3 .  ferrous sulfate (SLOW IRON) 160 (50 Fe) MG TBCR SR tablet, Take 1 tablet by mouth every  other day., Disp: , Rfl:  .  ferrous sulfate 325 (65 FE) MG tablet, Take by mouth., Disp: , Rfl:  .  Flax Oil-Fish Oil-Borage Oil (FISH OIL-FLAX OIL-BORAGE OIL) CAPS, Take 1 capsule by mouth daily., Disp: , Rfl:  .  glucose blood test strip, Use as instructed to check sugars twice daily.  Uncontrolled diabetes mellitus E11.65, Disp: 100 each, Rfl: 12 .  losartan (COZAAR) 25 MG tablet, Take 25 mg by mouth daily., Disp: , Rfl:  .  metFORMIN (GLUCOPHAGE) 500 MG tablet, Take 1 tablet (500 mg total) by mouth 2 (two) times daily with a meal., Disp: 180 tablet, Rfl: 0 .  Multiple Vitamin (MULTIVITAMIN) tablet, Take 1 tablet by mouth daily., Disp: , Rfl:    EXAM:  VITALS per patient if applicable:  GENERAL: alert, oriented, appears well and in no acute distress  HEENT: atraumatic, conjunttiva clear, no obvious abnormalities on inspection of external nose and ears  NECK: normal movements of the head and neck  LUNGS: on inspection no signs of respiratory distress, breathing rate appears normal, no obvious gross SOB, gasping or wheezing  CV: no obvious cyanosis  MS: moves all visible extremities without noticeable abnormality  PSYCH/NEURO: pleasant and cooperative, no obvious depression or anxiety, speech and thought processing grossly intact  ASSESSMENT AND PLAN:  Discussed the following assessment and plan:  Type 2 diabetes mellitus with nephropathy (HCC) - Plan: Hemoglobin A1c, Comprehensive metabolic panel, Lipid panel, Microalbumin / creatinine urine ratio  Diabetes mellitus without complication ([facility]) - Plan: metFORMIN (GLUCOPHAGE) 500 MG tablet  Normocytic anemia  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   Reducing metformin dose to 500 mg bid due to CKD   Normocytic anemia Multifactorial,  Including CKD.  Not iron deficient but taking slo FE every other day,  .  B12 is < 300 and not taking any suppelments so replacement has been advised   Lab Results  Component Value Date   WBC 7.9 02/03/2020   HGB 10.7 (L) 02/03/2020   HCT 35.0 (L) 02/03/2020   MCV 84.5 02/03/2020   PLT 301 02/03/2020   Lab Results  Component Value Date   IRON 50 02/03/2020   TIBC 309 02/03/2020   FERRITIN 29 02/03/2020   Lab Results  Component Value Date   VITAMINB12 262 01/06/2020       I discussed the assessment and treatment plan with the patient. The patient was provided an opportunity to ask questions and all were answered. The patient agreed with the plan and demonstrated an understanding of the instructions.   The patient was advised to call back or seek an in-person  evaluation if the symptoms worsen or if the condition fails to improve as anticipated.  I provided  22 minutes of non-face-to-face time during this encounter.   [name], MD

## 2020-04-19 NOTE — Assessment & Plan Note (Signed)
Not at goal, and overdue for surveillance labs.   Reducing metformin dose to 500 mg bid due to CKD

## 2020-04-19 NOTE — Assessment & Plan Note (Addendum)
Multifactorial,  Including CKD.  Not iron deficient but taking slo FE every other day,  .  B12 is < 300 and not taking any suppelments so replacement has been advised   Lab Results  Component Value Date   WBC 7.9 02/03/2020   HGB 10.7 (L) 02/03/2020   HCT 35.0 (L) 02/03/2020   MCV 84.5 02/03/2020   PLT 301 02/03/2020   Lab Results  Component Value Date   IRON 50 02/03/2020   TIBC 309 02/03/2020   FERRITIN 29 02/03/2020   Lab Results  Component Value Date   VITAMINB12 262 01/06/2020

## 2020-04-19 NOTE — Progress Notes (Signed)
[facility]  [address], [address] [place], [place] [address] Phone: [phone]  Fax: [phone]   Clinic Day:  04/20/2020  Referring physician: [name], MD  Chief Complaint: Jeanette Spencer is a 69 y.o. female with stage IIIb chronic kidney disease who is seen for 3 month assessment.  HPI:  The patient was last seen in the hematology clinic on 01/20/2020. At that time, she felt "good".  She denied any symptoms. Hematocrit was 35.7, hemoglobin 11.3, MCV 83.2, platelets 306,000, WBC 10,000. Ferritin was 31 with an iron saturation of 20% and a TIBC of 308. Reticulocyte count was 1.5%. Folate was 12.1. Vit B12 was 262 (low).  M-spike was 0.  Light chain ratio was 1.81.  24 hour urine revealed total protein of 279 mg/24 hours with no monoclonal protein.  The patient saw Dr. Holley Raring on 03/19/2020. She had stopped taking amlodipine because she feared it was affecting her renal function. It was felt that her kidney function was relatively stable.  Recommendation was to restart amlodipine.  CBC and iron studies followed: 01/24/2020: Hematocrit 34.5, hemoglobin 10.6, MCV 83.5, platelets 294,000, WBC 7,800.  02/03/2020: Hematocrit 35.0, hemoglobin 10.7, MCV 84.5, platelets 301,000, WBC 7,900. Ferritin 29. Iron saturation 16% with a TIBC 309. 03/19/2020: Hematocrit 35.9, hemoglobin 11.4, MCV 83.7, platelets 278,000, WBC 7,300.  Renal function followed: 01/24/2020: Creatinine 1.50. CrCl 40 ml/min. BUN 25. 01/31/2020: Creatinine 1.44. CrCl 43 ml/min. BUN 31. 02/28/2020: Creatinine 1.44. CrCl 43 ml/min. BUN 27. 03/19/2020: Creatinine 1.33. CrCl 47 ml/min. BUN 24. Urine Protein/creatinine ratio 185.  During the interim, she has been "so-so." She has been experiencing some balance problems and dizziness. She does not have a blood pressure cuff at home. She is eating well.  She denies restless legs, ice pica, or other cravings.  The patient stopped taking oral  Vitamin B12 when she started on oral iron because taking both simultaneously gave her diarrhea. She is currently taking oral iron daily. She is also taking Amlodipine.    Past Medical History:  Diagnosis Date  . Allergy   . Arthritis    knees  . Chicken pox   . Chronic bronchitis ([place])   . Diabetes mellitus without complication ([place])    type 2  . Hypertension   . Phlebitis   . Pulmonary embolism ([place]) 1990   after abdominal tumor removal    Past Surgical History:  Procedure Laterality Date  . ABDOMINAL HYSTERECTOMY    . abdominal tumor Left    size of grapefruit  . COLONOSCOPY WITH PROPOFOL N/A 09/24/2018   Procedure: COLONOSCOPY WITH BIOPSY;  Surgeon: [name], MD;  Location: [facility];  Service: Endoscopy;  Laterality: N/A;  diabetic - oral meds  requests arrival after 8:30  . POLYPECTOMY N/A 09/24/2018   Procedure: POLYPECTOMY;  Surgeon: [name], MD;  Location: [facility];  Service: Endoscopy;  Laterality: N/A;  . TONSILECTOMY, ADENOIDECTOMY, BILATERAL MYRINGOTOMY AND TUBES      Family History  Problem Relation Age of Onset  . Diabetes Mother   . Cancer Father   . Cancer Paternal Aunt   . Cancer Paternal Uncle   . Diabetes Paternal Grandmother   Her father had prostate cancer. Her paternal aunt had breast cancer and her uncle had cancer.    Social History:  reports that she has never smoked. She has never used smokeless tobacco. She reports current alcohol use. She reports that she does not use drugs. 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 She denies any exposure to radiation or toxins. She lives in [place].  The patient is alone today.  Allergies:  Allergies  Allergen Reactions  . Oxycodone   . Glipizide     Recurrent hypoglycemia  . Lipitor [Atorvastatin]     Affected balance    Current Medications: Current Outpatient Medications  Medication Sig Dispense Refill  . amLODipine (NORVASC) 5 MG tablet Take 1 tablet (5 mg  total) by mouth daily. 90 tablet 3  . atorvastatin (LIPITOR) 20 MG tablet Take 1 tablet (20 mg total) by mouth daily. 90 tablet 3  . ferrous sulfate 325 (65 FE) MG tablet Take by mouth.    [name] glucose blood test strip Use as instructed to check sugars twice daily.  Uncontrolled diabetes mellitus E11.65 100 each 12  . Multiple Vitamin (MULTIVITAMIN) tablet Take 1 tablet by mouth daily.    . ferrous sulfate (SLOW IRON) 160 (50 Fe) MG TBCR SR tablet Take 1 tablet by mouth every other day.    . Flax Oil-Fish Oil-Borage Oil (FISH OIL-FLAX OIL-BORAGE OIL) CAPS Take 1 capsule by mouth daily.    [name] losartan (COZAAR) 25 MG tablet Take 25 mg by mouth daily.    . metFORMIN (GLUCOPHAGE) 500 MG tablet Take 1 tablet (500 mg total) by mouth 2 (two) times daily with a meal. 180 tablet 0   No current facility-administered medications for this visit.    Review of Systems  Constitutional: Negative.  Negative for chills, diaphoresis, fever, malaise/fatigue and weight loss (up 1 lb).       Feels "so-so."  HENT: Negative.  Negative for congestion, ear discharge, ear pain, hearing loss, nosebleeds, sinus pain and sore throat.   Eyes: Negative.  Negative for blurred vision, double vision and photophobia.  Respiratory: Negative.  Negative for cough, hemoptysis, sputum production and shortness of breath.   Cardiovascular: Negative for chest pain, palpitations and leg swelling.  Gastrointestinal: Negative.  Negative for abdominal pain, blood in stool, constipation, diarrhea, heartburn, melena, nausea and vomiting.       Eating well. No cravings. No ice picca.  Genitourinary: Negative for dysuria, frequency, hematuria and urgency.       H/o kidney stones.  Musculoskeletal: Negative for back pain, joint pain, myalgias and neck pain.  Skin: Negative.  Negative for itching and rash.  Neurological: Positive for dizziness. Negative for tingling, sensory change, focal weakness, weakness and headaches.       Balance problems   Endo/Heme/Allergies: Negative for environmental allergies. Does not bruise/bleed easily.  Psychiatric/Behavioral: Negative for depression and memory loss. The patient is not nervous/anxious and does not have insomnia.   All other systems reviewed and are negative.  Performance status (ECOG): 1  Vitals Blood pressure (!) 142/63, pulse 79, temperature (!) 96 F (35.6 C), temperature source Tympanic, resp. rate 18, weight 190 lb 4.1 oz (86.3 kg), SpO2 99 %.   Physical Exam Vitals and nursing note reviewed.  Constitutional:      General: She is not in acute distress.    Appearance: She is well-developed. She is not diaphoretic.  HENT:     Head: Normocephalic and atraumatic.     Comments: Short white hair.    Mouth/Throat:     Mouth: Mucous membranes are moist.  Eyes:     General: No scleral icterus.    Extraocular Movements: Extraocular movements intact.     Conjunctiva/sclera: Conjunctivae normal.     Pupils: Pupils are equal, round, and  reactive to light.     Comments: Glasses. Brown eyes.  Cardiovascular:     Rate and Rhythm: Normal rate and regular rhythm.     Pulses: Normal pulses.     Heart sounds: Normal heart sounds.  Pulmonary:     Effort: Pulmonary effort is normal. No respiratory distress.     Breath sounds: Normal breath sounds. No wheezing or rales.  Abdominal:     General: Abdomen is flat. Bowel sounds are normal. There is no distension.     Palpations: Abdomen is soft. There is no mass.     Tenderness: There is no abdominal tenderness. There is no guarding.  Musculoskeletal:        General: No swelling or tenderness. Normal range of motion.     Cervical back: Normal range of motion and neck supple. No tenderness.     Right lower leg: No edema.     Left lower leg: No edema.  Lymphadenopathy:     Head:     Right side of head: No preauricular, posterior auricular or occipital adenopathy.     Left side of head: No preauricular, posterior auricular or occipital  adenopathy.     Cervical: No cervical adenopathy.     Upper Body:     Right upper body: No supraclavicular or axillary adenopathy.     Left upper body: No supraclavicular or axillary adenopathy.     Lower Body: No right inguinal adenopathy. No left inguinal adenopathy.  Skin:    General: Skin is warm and dry.  Neurological:     Mental Status: She is alert and oriented to person, place, and time.  Psychiatric:        Behavior: Behavior normal.        Thought Content: Thought content normal.        Judgment: Judgment normal.    Appointment on 04/20/2020  Component Date Value Ref Range Status  . WBC 04/20/2020 10.3  4.0 - 10.5 K/uL Final  . RBC 04/20/2020 4.19  3.87 - 5.11 MIL/uL Final  . Hemoglobin 04/20/2020 11.1* 12.0 - 15.0 g/dL Final  . HCT 04/20/2020 34.9* 36 - 46 % Final  . MCV 04/20/2020 83.3  80.0 - 100.0 fL Final  . MCH 04/20/2020 26.5  26.0 - 34.0 pg Final  . MCHC 04/20/2020 31.8  30.0 - 36.0 g/dL Final  . RDW 04/20/2020 15.1  11.5 - 15.5 % Final  . Platelets 04/20/2020 276  150 - 400 K/uL Final  . nRBC 04/20/2020 0.0  0.0 - 0.2 % Final  . Neutrophils Relative % 04/20/2020 63  % Final  . Neutro Abs 04/20/2020 6.5  1.7 - 7.7 K/uL Final  . Lymphocytes Relative 04/20/2020 30  % Final  . Lymphs Abs 04/20/2020 3.1  0.7 - 4.0 K/uL Final  . Monocytes Relative 04/20/2020 6  % Final  . Monocytes Absolute 04/20/2020 0.6  0 - 1 K/uL Final  . Eosinophils Relative 04/20/2020 1  % Final  . Eosinophils Absolute 04/20/2020 0.1  0 - 0 K/uL Final  . Basophils Relative 04/20/2020 0  % Final  . Basophils Absolute 04/20/2020 0.0  0 - 0 K/uL Final  . Immature Granulocytes 04/20/2020 0  % Final  . Abs Immature Granulocytes 04/20/2020 0.04  0.00 - 0.07 K/uL Final   Performed at [facility], [address] [place], [place] [address]    Assessment:  Jeanette Spencer is a 69 y.o. female with stage IIIb chronic kidney disease and  abnormal SPEP/UPEP.  Initial evaluation revealed  normal SPEP, UPEP and free light chains.  Labs on 12/13/2019 revealed a hematocrit 34.9, hemoglobin 10.9, MCV 82.5, platelets 310,000, WBC 9,900. Creatinine was 1.18. SPEP revealed a poorly defined band of restricted protein mobility in the gamma globulins.  Random UPEP revealed a possible abnormal protein band in the alpha2 region felt most likely due to hemoglobin.  Work up on 01/06/2020 revealed hematocrit 35.7, hemoglobin 11.3, MCV 83.2, platelets 306,000, WBC 10,000.  Ferritin was 31 with iron saturation of 20% and a TIBC of 308. Kappa free light chain 55.1, lambda free light chain 30.4 with ratio of 1.81 (0.26-1.65). SPEP was normal. Retic was 1.5%.  Vitamin B12 was 262 (low) and folate 12.1.  24 hour urine for UPEP and free light chanis on 01/08/2020 was normal.  She has iron deficiency anemia.  Ferritin has been followed:  56.9 on 04/11/2013, 37 on 09/26/2019, 31 on 01/06/2020, 29 on 02/03/2020 and 28 on 04/20/2020.  She is on oral iron.  Colonoscopy on 09/24/2018 revealed a 4 mm polyp (hyperplastic polyp) in the sigmoid colon and diverticulosis in the sigmoid colon.  She has B12 deficiency.  B12 was 155 on 04/11/2013, 262 on 01/06/2020 and 326 on 04/20/2020.  She previously gave herself B12 injections; she stopped 3 years ago. She stopped oral B12 in 09/2019 and again recently secondary to diarrhea when she takes oral iron at the same time.   She had her last COVID-19 vaccine in 12/2019 Levan Hurst).  Symptomatically, she is doing "so-so".  She notes nausea when taking oral B12 and iron at the same time.  Exam is stable.  Plan: 1.   Labs today: CBC with diff, ferritin, iron stores. 2.   Mild normocytic anemia  Hematocrit 35.7.  Hemoglobin 11.3.  MCV 83.2 on 01/06/2020.    Hematocrit 34.9.  Hemoglobin 11.1.  MCV 83.3 on 04/20/2020.   Etiology multi-factorial and felt due to iron deficiency, B12 deficiency, and anemia of chronic renal disease.  She denies any bleeding.  Continue to  monitor. 3.   B12 deficiency  B12 was 262 on 01/06/2020.  She stopped taking B12 secondary to diarrhea when she takes oral iron.  B12 goal is 400  Begin monthly B12 injections at home   Rx provided.  Check folate annually. 4.   Iron deficiency  Hematocrit 34.9.  Hemoglobin 11.1.    Ferritin 28 with an iron saturation of 27% and a TIBC of 301.  Continue oral iron 5.   RN to send Rx for B12 injections at home. 6.   RTC in 3 months for labs (CBC with diff, ferritin, B12, folate). 7.   RTC in 6 months for MD assessment and labs (CBC with diff, ferritin, iron stores).  I discussed the assessment and treatment plan with the patient.  The patient was provided an opportunity to ask questions and all were answered.  The patient agreed with the plan and demonstrated an understanding of the instructions.  The patient was advised to call back if the symptoms worsen or if the condition fails to improve as anticipated.    Lequita Asal, MD  04/20/2020; 11:32 PM   I, De Burrs, am acting as Education administrator for [employer]. Mike Gip, MD, PhD.  I, Josalynn Johndrow C. Mike Gip, MD, have reviewed the above documentation for accuracy and completeness, and I agree with the above.

## 2020-04-20 ENCOUNTER — Inpatient Hospital Stay: Payer: Medicare (Managed Care) | Attending: Hematology and Oncology | Admitting: Hematology and Oncology

## 2020-04-20 ENCOUNTER — Other Ambulatory Visit: Payer: Self-pay

## 2020-04-20 ENCOUNTER — Other Ambulatory Visit: Payer: Self-pay | Admitting: Hematology and Oncology

## 2020-04-20 ENCOUNTER — Encounter: Payer: Self-pay | Admitting: Hematology and Oncology

## 2020-04-20 ENCOUNTER — Inpatient Hospital Stay: Payer: Medicare (Managed Care)

## 2020-04-20 VITALS — BP 142/63 | HR 79 | Temp 96.0°F | Resp 18 | Wt 190.3 lb

## 2020-04-20 DIAGNOSIS — D509 Iron deficiency anemia, unspecified: Secondary | ICD-10-CM

## 2020-04-20 DIAGNOSIS — Z809 Family history of malignant neoplasm, unspecified: Secondary | ICD-10-CM | POA: Insufficient documentation

## 2020-04-20 DIAGNOSIS — I129 Hypertensive chronic kidney disease with stage 1 through stage 4 chronic kidney disease, or unspecified chronic kidney disease: Secondary | ICD-10-CM | POA: Insufficient documentation

## 2020-04-20 DIAGNOSIS — R110 Nausea: Secondary | ICD-10-CM | POA: Diagnosis not present

## 2020-04-20 DIAGNOSIS — N1832 Chronic kidney disease, stage 3b: Secondary | ICD-10-CM | POA: Diagnosis present

## 2020-04-20 DIAGNOSIS — E611 Iron deficiency: Secondary | ICD-10-CM | POA: Diagnosis not present

## 2020-04-20 DIAGNOSIS — Z7984 Long term (current) use of oral hypoglycemic drugs: Secondary | ICD-10-CM | POA: Insufficient documentation

## 2020-04-20 DIAGNOSIS — D649 Anemia, unspecified: Secondary | ICD-10-CM

## 2020-04-20 DIAGNOSIS — E1122 Type 2 diabetes mellitus with diabetic chronic kidney disease: Secondary | ICD-10-CM | POA: Diagnosis not present

## 2020-04-20 DIAGNOSIS — Z86711 Personal history of pulmonary embolism: Secondary | ICD-10-CM | POA: Diagnosis not present

## 2020-04-20 DIAGNOSIS — Z803 Family history of malignant neoplasm of breast: Secondary | ICD-10-CM | POA: Diagnosis not present

## 2020-04-20 DIAGNOSIS — Z885 Allergy status to narcotic agent status: Secondary | ICD-10-CM | POA: Diagnosis not present

## 2020-04-20 DIAGNOSIS — Z833 Family history of diabetes mellitus: Secondary | ICD-10-CM | POA: Insufficient documentation

## 2020-04-20 DIAGNOSIS — D631 Anemia in chronic kidney disease: Secondary | ICD-10-CM | POA: Diagnosis present

## 2020-04-20 DIAGNOSIS — E538 Deficiency of other specified B group vitamins: Secondary | ICD-10-CM

## 2020-04-20 DIAGNOSIS — R42 Dizziness and giddiness: Secondary | ICD-10-CM | POA: Diagnosis not present

## 2020-04-20 DIAGNOSIS — Z79899 Other long term (current) drug therapy: Secondary | ICD-10-CM | POA: Insufficient documentation

## 2020-04-20 DIAGNOSIS — R2689 Other abnormalities of gait and mobility: Secondary | ICD-10-CM | POA: Diagnosis not present

## 2020-04-20 LAB — CBC WITH DIFFERENTIAL/PLATELET
Abs Immature Granulocytes: 0.04 10*3/uL (ref 0.00–0.07)
Basophils Absolute: 0 10*3/uL (ref 0.0–0.1)
Basophils Relative: 0 %
Eosinophils Absolute: 0.1 10*3/uL (ref 0.0–0.5)
Eosinophils Relative: 1 %
HCT: 34.9 % — ABNORMAL LOW (ref 36.0–46.0)
Hemoglobin: 11.1 g/dL — ABNORMAL LOW (ref 12.0–15.0)
Immature Granulocytes: 0 %
Lymphocytes Relative: 30 %
Lymphs Abs: 3.1 10*3/uL (ref 0.7–4.0)
MCH: 26.5 pg (ref 26.0–34.0)
MCHC: 31.8 g/dL (ref 30.0–36.0)
MCV: 83.3 fL (ref 80.0–100.0)
Monocytes Absolute: 0.6 10*3/uL (ref 0.1–1.0)
Monocytes Relative: 6 %
Neutro Abs: 6.5 10*3/uL (ref 1.7–7.7)
Neutrophils Relative %: 63 %
Platelets: 276 10*3/uL (ref 150–400)
RBC: 4.19 MIL/uL (ref 3.87–5.11)
RDW: 15.1 % (ref 11.5–15.5)
WBC: 10.3 10*3/uL (ref 4.0–10.5)
nRBC: 0 % (ref 0.0–0.2)

## 2020-04-20 LAB — IRON AND TIBC
Iron: 80 ug/dL (ref 28–170)
Saturation Ratios: 27 % (ref 10.4–31.8)
TIBC: 301 ug/dL (ref 250–450)
UIBC: 221 ug/dL

## 2020-04-20 LAB — FERRITIN: Ferritin: 28 ng/mL (ref 11–307)

## 2020-04-20 MED ORDER — B-12 COMPLIANCE INJECTION 1000 MCG/ML IJ KIT: 1000.0000 ug | PACK | INTRAMUSCULAR | 5 refills | Status: DC

## 2020-04-20 NOTE — Patient Instructions (Signed)

## 2020-04-21 LAB — VITAMIN B12: Vitamin B-12: 326 pg/mL (ref 180–914)

## 2020-04-26 DIAGNOSIS — D509 Iron deficiency anemia, unspecified: Secondary | ICD-10-CM | POA: Insufficient documentation

## 2020-06-16 ENCOUNTER — Other Ambulatory Visit: Payer: Self-pay

## 2020-06-16 ENCOUNTER — Other Ambulatory Visit (INDEPENDENT_AMBULATORY_CARE_PROVIDER_SITE_OTHER): Payer: Medicare (Managed Care)

## 2020-06-16 DIAGNOSIS — E1121 Type 2 diabetes mellitus with diabetic nephropathy: Secondary | ICD-10-CM

## 2020-06-16 DIAGNOSIS — E785 Hyperlipidemia, unspecified: Secondary | ICD-10-CM | POA: Diagnosis not present

## 2020-06-16 DIAGNOSIS — R5383 Other fatigue: Secondary | ICD-10-CM

## 2020-06-16 DIAGNOSIS — I10 Essential (primary) hypertension: Secondary | ICD-10-CM | POA: Diagnosis not present

## 2020-06-16 DIAGNOSIS — E1169 Type 2 diabetes mellitus with other specified complication: Secondary | ICD-10-CM

## 2020-06-16 LAB — LIPID PANEL
Cholesterol: 154 mg/dL (ref 0–200)
HDL: 57.6 mg/dL (ref >39.00)
LDL Cholesterol: 81 mg/dL (ref 0–99)
NonHDL: 96.59
Total CHOL/HDL Ratio: 3
Triglycerides: 80 mg/dL (ref 0.0–149.0)
VLDL: 16 mg/dL (ref 0.0–40.0)

## 2020-06-16 LAB — COMPREHENSIVE METABOLIC PANEL
ALT: 12 U/L (ref 0–35)
AST: 13 U/L (ref 0–37)
Albumin: 4.2 g/dL (ref 3.5–5.2)
Alkaline Phosphatase: 70 U/L (ref 39–117)
BUN: 23 mg/dL (ref 6–23)
CO2: 28 mEq/L (ref 19–32)
Calcium: 9.3 mg/dL (ref 8.4–10.5)
Chloride: 105 mEq/L (ref 96–112)
Creatinine, Ser: 1.32 mg/dL — ABNORMAL HIGH (ref 0.40–1.20)
GFR: 48.06 mL/min — ABNORMAL LOW (ref >60.00)
Glucose, Bld: 142 mg/dL — ABNORMAL HIGH (ref 70–99)
Potassium: 4.9 mEq/L (ref 3.5–5.1)
Sodium: 138 mEq/L (ref 135–145)
Total Bilirubin: 0.3 mg/dL (ref 0.2–1.2)
Total Protein: 6.9 g/dL (ref 6.0–8.3)

## 2020-06-16 LAB — MICROALBUMIN / CREATININE URINE RATIO
Creatinine,U: 83.2 mg/dL
Microalb Creat Ratio: 2.5 mg/g (ref 0.0–30.0)
Microalb, Ur: 2.1 mg/dL — ABNORMAL HIGH (ref 0.0–1.9)

## 2020-06-16 LAB — HEMOGLOBIN A1C: Hgb A1c MFr Bld: 7.6 % — ABNORMAL HIGH (ref 4.6–6.5)

## 2020-06-16 LAB — TSH: TSH: 2.49 u[IU]/mL (ref 0.35–4.50)

## 2020-06-20 NOTE — Progress Notes (Signed)
Your cholesterol and kidney function have improved but your a1c is still 7.6 (goal is < 7.0).  You will need to add additional medications to control your diabetes.  Please schedule a visit to discuss and be able to provide some recent  blood sugar readings,  both fasting and post prandial (2 hours after a meal)

## 2020-06-25 ENCOUNTER — Telehealth: Payer: Self-pay | Admitting: Internal Medicine

## 2020-06-25 DIAGNOSIS — E119 Type 2 diabetes mellitus without complications: Secondary | ICD-10-CM

## 2020-06-25 MED ORDER — METFORMIN HCL 500 MG PO TABS: 500.0000 mg | ORAL_TABLET | Freq: Two times a day (BID) | ORAL | 0 refills | Status: DC

## 2020-06-25 NOTE — Telephone Encounter (Signed)
Patient called in stated that she is going to be out of her medication before  Her appointment on 07-10-20 her medication is metFORMIN (GLUCOPHAGE) 500 MG tablet

## 2020-07-10 ENCOUNTER — Other Ambulatory Visit: Payer: Self-pay

## 2020-07-10 ENCOUNTER — Ambulatory Visit (INDEPENDENT_AMBULATORY_CARE_PROVIDER_SITE_OTHER): Payer: Medicare (Managed Care) | Admitting: Internal Medicine

## 2020-07-10 ENCOUNTER — Encounter: Payer: Self-pay | Admitting: Internal Medicine

## 2020-07-10 VITALS — BP 132/72 | HR 77 | Temp 98.2°F | Resp 16 | Ht 59.75 in | Wt 189.6 lb

## 2020-07-10 DIAGNOSIS — E1169 Type 2 diabetes mellitus with other specified complication: Secondary | ICD-10-CM

## 2020-07-10 DIAGNOSIS — E1121 Type 2 diabetes mellitus with diabetic nephropathy: Secondary | ICD-10-CM

## 2020-07-10 DIAGNOSIS — E1122 Type 2 diabetes mellitus with diabetic chronic kidney disease: Secondary | ICD-10-CM

## 2020-07-10 DIAGNOSIS — E785 Hyperlipidemia, unspecified: Secondary | ICD-10-CM

## 2020-07-10 DIAGNOSIS — Z1231 Encounter for screening mammogram for malignant neoplasm of breast: Secondary | ICD-10-CM

## 2020-07-10 DIAGNOSIS — E119 Type 2 diabetes mellitus without complications: Secondary | ICD-10-CM

## 2020-07-10 DIAGNOSIS — N183 Chronic kidney disease, stage 3 unspecified: Secondary | ICD-10-CM

## 2020-07-10 DIAGNOSIS — I10 Essential (primary) hypertension: Secondary | ICD-10-CM

## 2020-07-10 DIAGNOSIS — Z780 Asymptomatic menopausal state: Secondary | ICD-10-CM

## 2020-07-10 MED ORDER — METFORMIN HCL 500 MG PO TABS: 500.0000 mg | ORAL_TABLET | Freq: Two times a day (BID) | ORAL | 1 refills | Status: DC

## 2020-07-10 NOTE — Patient Instructions (Addendum)
I agree with resuming glipizide.  But I recommend you change glipizide to lunchtime dosing   Check sugars  2 hours afterward   Also check your blood sugar 2 hours after your evening meal   Your annual mammogram and DEXA scan has been ordered.

## 2020-07-10 NOTE — Progress Notes (Signed)
Subjective:  Patient ID: Jeanette Spencer, female    DOB: [birth year]  Age: 69 y.o. MRN: [id number]  CC: The primary encounter diagnosis was Breast cancer screening by mammogram. Diagnoses of Postmenopausal estrogen deficiency, Diabetes mellitus without complication ([place]), Diabetic nephropathy associated with type 2 diabetes mellitus ([place]), CKD stage 3 due to type 2 diabetes mellitus ([place]), Essential hypertension, benign, Hyperlipidemia associated with type 2 diabetes mellitus ([place]), and Type 2 diabetes mellitus with nephropathy ([place]) were also pertinent to this visit.  HPI Caterina A Camino presents for FOLLOW UP  On hypertension , hyperlipidemia and Type 2 DM   This visit occurred during the SARS-CoV-2 public health emergency.  Safety protocols were in place, including screening questions prior to the visit, additional usage of staff PPE, and extensive cleaning of exam room while observing appropriate contact time as indicated for disinfecting solutions.   T2DM:  Patient has resumed glipzide 64m once daily on her own on or around Sept 21 when her a1c had risen.  She is taking it .at breakfast  With metformin. Morning sugars 86 to 140  Not checking post prandials.. Following a carbohydrate modified diet about 80% of the time .   Denies numbness, burning and tingling of extremities. Appetite is good.    Hypertension: patient does not  check blood pressure at home.  Patient is following a reduce salt diet most days and is taking medications as prescribed  Retired in January   Last mammogram 2019   Outpatient Medications Prior to Visit  Medication Sig Dispense Refill  . amLODipine (NORVASC) 5 MG tablet Take 1 tablet (5 mg total) by mouth daily. 90 tablet 3  . atorvastatin (LIPITOR) 20 MG tablet Take 1 tablet (20 mg total) by mouth daily. 90 tablet 3  . Cyanocobalamin (B-12 COMPLIANCE INJECTION) 1000 MCG/ML KIT Inject 1,000 mcg as directed every 30 (thirty) days. 1 kit 5  . Flax Oil-Fish  Oil-Borage Oil (FISH OIL-FLAX OIL-BORAGE OIL) CAPS Take 1 capsule by mouth daily.    .Marland KitchenglipiZIDE (GLUCOTROL) 5 MG tablet Take 5 mg by mouth daily before breakfast.    . glucose blood test strip Use as instructed to check sugars twice daily.  Uncontrolled diabetes mellitus E11.65 100 each 12  . Multiple Vitamin (MULTIVITAMIN) tablet Take 1 tablet by mouth daily.    .Marland Kitchenlosartan (COZAAR) 25 MG tablet Take 25 mg by mouth daily.    . metFORMIN (GLUCOPHAGE) 500 MG tablet Take 1 tablet (500 mg total) by mouth 2 (two) times daily with a meal. 180 tablet 0  . ferrous sulfate (SLOW IRON) 160 (50 Fe) MG TBCR SR tablet Take 1 tablet by mouth every other day. (Patient not taking: Reported on 07/10/2020)    . ferrous sulfate 325 (65 FE) MG tablet Take by mouth. (Patient not taking: Reported on 07/10/2020)     No facility-administered medications prior to visit.    Review of Systems;  Patient denies headache, fevers, malaise, unintentional weight loss, skin rash, eye pain, sinus congestion and sinus pain, sore throat, dysphagia,  hemoptysis , cough, dyspnea, wheezing, chest pain, palpitations, orthopnea, edema, abdominal pain, nausea, melena, diarrhea, constipation, flank pain, dysuria, hematuria, urinary  Frequency, nocturia, numbness, tingling, seizures,  Focal weakness, Loss of consciousness,  Tremor, insomnia, depression, anxiety, and suicidal ideation.      Objective:  BP 132/72 (BP Location: Left Arm, Patient Position: Sitting, Cuff Size: Normal)   Pulse 77   Temp 98.2 F (36.8 C) (Oral)   Resp 16  Ht 4' 11.75" (1.518 m)   Wt 189 lb 9.6 oz (86 kg)   SpO2 99%   BMI 37.34 kg/m   BP Readings from Last 3 Encounters:  07/10/20 132/72  04/20/20 (!) 142/63  01/20/20 128/73    Wt Readings from Last 3 Encounters:  07/10/20 189 lb 9.6 oz (86 kg)  04/20/20 190 lb 4.1 oz (86.3 kg)  01/20/20 189 lb 11.3 oz (86 kg)    General appearance: alert, cooperative and appears stated age Ears: normal TM's  and external ear canals both ears Throat: lips, mucosa, and tongue normal; teeth and gums normal Neck: no adenopathy, no carotid bruit, supple, symmetrical, trachea midline and thyroid not enlarged, symmetric, no tenderness/mass/nodules Back: symmetric, no curvature. ROM normal. No CVA tenderness. Lungs: clear to auscultation bilaterally Heart: regular rate and rhythm, S1, S2 normal, no murmur, click, rub or gallop Abdomen: soft, non-tender; bowel sounds normal; no masses,  no organomegaly Pulses: 2+ and symmetric Skin: Skin color, texture, turgor normal. No rashes or lesions Lymph nodes: Cervical, supraclavicular, and axillary nodes normal.  Lab Results  Component Value Date   HGBA1C 7.6 (H) 06/16/2020   HGBA1C 7.6 (H) 08/30/2019   HGBA1C 8.1 (H) 11/09/2018    Lab Results  Component Value Date   CREATININE 1.32 (H) 06/16/2020   CREATININE 1.40 (H) 10/31/2019   CREATININE 1.82 (H) 10/24/2019    Lab Results  Component Value Date   WBC 10.3 04/20/2020   HGB 11.1 (L) 04/20/2020   HCT 34.9 (L) 04/20/2020   PLT 276 04/20/2020   GLUCOSE 142 (H) 06/16/2020   CHOL 154 06/16/2020   TRIG 80.0 06/16/2020   HDL 57.60 06/16/2020   LDLDIRECT 172.0 09/27/2017   LDLCALC 81 06/16/2020   ALT 12 06/16/2020   AST 13 06/16/2020   NA 138 06/16/2020   K 4.9 06/16/2020   CL 105 06/16/2020   CREATININE 1.32 (H) 06/16/2020   BUN 23 06/16/2020   CO2 28 06/16/2020   TSH 2.49 06/16/2020   HGBA1C 7.6 (H) 06/16/2020   MICROALBUR 2.1 (H) 06/16/2020    US RENAL  Result Date: 12/19/2019 CLINICAL DATA:  Chronic renal disease EXAM: RENAL / URINARY TRACT ULTRASOUND COMPLETE COMPARISON:  None. FINDINGS: Right Kidney: Renal measurements: 12.3 x 4.7 x 4.6 cm. = volume: 139 mL . Echogenicity within normal limits. No mass or hydronephrosis visualized. Left Kidney: Renal measurements: 11.6 x 6.1 x 4.5 cm = volume: 162 mL. No mass lesion or hydronephrosis is noted. Seventeen calcification is noted in the  lower pole without obstructive change. Bladder: Appears normal for degree of bladder distention. Other: None. IMPRESSION: Nonobstructing left renal stone. No other focal abnormality is noted. Electronically Signed   By: Inez Catalina M.D.   On: 12/19/2019 16:24    Assessment & Plan:   Problem List Items Addressed This Visit      Unprioritized   CKD stage 3 due to type 2 diabetes mellitus (H[facility])    GFR has been < 60 for the last year. Referral to nephrology was made in February and she was referred to Hematology for abnormal SPEP.  Lab Results  Component Value Date   CREATININE 1.32 (H) 06/16/2020   Lab Results  Component Value Date   NA 138 06/16/2020   K 4.9 06/16/2020   CL 105 06/16/2020   CO2 28 06/16/2020         Relevant Medications   glipiZIDE (GLUCOTROL) 5 MG tablet   metFORMIN (GLUCOPHAGE) 500 MG tablet  losartan (COZAAR) 25 MG tablet   Diabetic nephropathy associated with type 2 diabetes mellitus ([place])    Has resumed losartan  25 mg daily   Lab Results  Component Value Date   MICROALBUR 2.1 (H) 06/16/2020   Lab Results  Component Value Date   CREATININE 1.32 (H) 06/16/2020         Relevant Medications   glipiZIDE (GLUCOTROL) 5 MG tablet   metFORMIN (GLUCOPHAGE) 500 MG tablet   losartan (COZAAR) 25 MG tablet   Essential hypertension, benign    Well controlled on current regimen of losartan 25 mg and amlodipine 5 mg daily . Renal function stable, no changes today.      Relevant Medications   losartan (COZAAR) 25 MG tablet   Hyperlipidemia associated with type 2 diabetes mellitus ([place])    LDL rose by 100 pts with suspension of atorvastatin and she has resumed 20 mg dose with good results. No changes today  Lab Results  Component Value Date   CHOL 154 06/16/2020   HDL 57.60 06/16/2020   LDLCALC 81 06/16/2020   LDLDIRECT 172.0 09/27/2017   TRIG 80.0 06/16/2020   CHOLHDL 3 06/16/2020         Relevant Medications   glipiZIDE (GLUCOTROL) 5 MG tablet    metFORMIN (GLUCOPHAGE) 500 MG tablet   losartan (COZAAR) 25 MG tablet   Type 2 diabetes mellitus with nephropathy ([place])    Patient resumed glipizide on her own starting with lower dose of 5 mg once daily at breakfast with metformin. Foot exam done.  BS reviewed,   Advised to change dosing to lunchtime and check evening sugars. .   Lab Results  Component Value Date   HGBA1C 7.6 (H) 06/16/2020   Lab Results  Component Value Date   NA 138 06/16/2020   K 4.9 06/16/2020   CL 105 06/16/2020   CO2 28 06/16/2020         Relevant Medications   glipiZIDE (GLUCOTROL) 5 MG tablet   metFORMIN (GLUCOPHAGE) 500 MG tablet   losartan (COZAAR) 25 MG tablet    Other Visit Diagnoses    Breast cancer screening by mammogram    -  Primary   Relevant Orders   MM DIGITAL SCREENING BILATERAL   Postmenopausal estrogen deficiency       Relevant Orders   DG Bone Density   Diabetes mellitus without complication (HCC)       Relevant Medications   glipiZIDE (GLUCOTROL) 5 MG tablet   metFORMIN (GLUCOPHAGE) 500 MG tablet   losartan (COZAAR) 25 MG tablet      I have discontinued Sheyli A. Chrissie Noa "Francess Ann"'s ferrous sulfate and Slow Iron. I have also changed her losartan. Additionally, I am having her maintain her glucose blood, multivitamin, atorvastatin, amLODipine, Fish Oil-Flax Oil-Borage Oil, B-12 Compliance Injection, glipiZIDE, and metFORMIN.  Meds ordered this encounter  Medications  . metFORMIN (GLUCOPHAGE) 500 MG tablet    Sig: Take 1 tablet (500 mg total) by mouth 2 (two) times daily with a meal.    Dispense:  180 tablet    Refill:  1  . losartan (COZAAR) 25 MG tablet    Sig: Take 1 tablet (25 mg total) by mouth daily.    Dispense:  90 tablet    Refill:  1    Medications Discontinued During This Encounter  Medication Reason  . ferrous sulfate (SLOW IRON) 160 (50 Fe) MG TBCR SR tablet   . ferrous sulfate 325 (65 FE) MG tablet   .  metFORMIN (GLUCOPHAGE) 500 MG tablet Reorder  .  losartan (COZAAR) 25 MG tablet Reorder    Follow-up: Return in about 3 months (around 10/10/2020) for follow up diabetes.   [name], MD

## 2020-07-12 MED ORDER — LOSARTAN POTASSIUM 25 MG PO TABS
25.0000 mg | ORAL_TABLET | Freq: Every day | ORAL | 1 refills | Status: DC
Start: 2020-07-12 — End: 2020-11-04

## 2020-07-12 NOTE — Assessment & Plan Note (Addendum)
GFR has been < 60 for the last year. Referral to nephrology was made in February and she was referred to Hematology for abnormal SPEP.  Lab Results  Component Value Date   CREATININE 1.32 (H) 06/16/2020   Lab Results  Component Value Date   NA 138 06/16/2020   K 4.9 06/16/2020   CL 105 06/16/2020   CO2 28 06/16/2020

## 2020-07-12 NOTE — Assessment & Plan Note (Addendum)
Patient resumed glipizide on her own starting with lower dose of 5 mg once daily at breakfast with metformin. Foot exam done.  BS reviewed,   Advised to change dosing to lunchtime and check evening sugars. .   Lab Results  Component Value Date   HGBA1C 7.6 (H) 06/16/2020   Lab Results  Component Value Date   NA 138 06/16/2020   K 4.9 06/16/2020   CL 105 06/16/2020   CO2 28 06/16/2020

## 2020-07-12 NOTE — Assessment & Plan Note (Signed)
Well controlled on current regimen of losartan 25 mg and amlodipine 5 mg daily . Renal function stable, no changes today.

## 2020-07-12 NOTE — Assessment & Plan Note (Signed)
Has resumed losartan  25 mg daily   Lab Results  Component Value Date   MICROALBUR 2.1 (H) 06/16/2020   Lab Results  Component Value Date   CREATININE 1.32 (H) 06/16/2020

## 2020-07-12 NOTE — Assessment & Plan Note (Signed)
LDL rose by 100 pts with suspension of atorvastatin and she has resumed 20 mg dose with good results. No changes today  Lab Results  Component Value Date   CHOL 154 06/16/2020   HDL 57.60 06/16/2020   LDLCALC 81 06/16/2020   LDLDIRECT 172.0 09/27/2017   TRIG 80.0 06/16/2020   CHOLHDL 3 06/16/2020

## 2020-07-17 ENCOUNTER — Other Ambulatory Visit: Payer: Self-pay

## 2020-07-17 DIAGNOSIS — D649 Anemia, unspecified: Secondary | ICD-10-CM

## 2020-07-17 DIAGNOSIS — E538 Deficiency of other specified B group vitamins: Secondary | ICD-10-CM

## 2020-07-20 ENCOUNTER — Inpatient Hospital Stay: Payer: Medicare (Managed Care) | Attending: Hematology and Oncology

## 2020-07-20 ENCOUNTER — Other Ambulatory Visit: Payer: Self-pay

## 2020-07-20 DIAGNOSIS — E538 Deficiency of other specified B group vitamins: Secondary | ICD-10-CM | POA: Insufficient documentation

## 2020-07-20 DIAGNOSIS — D649 Anemia, unspecified: Secondary | ICD-10-CM | POA: Diagnosis present

## 2020-07-20 LAB — CBC WITH DIFFERENTIAL/PLATELET
Abs Immature Granulocytes: 0.03 10*3/uL (ref 0.00–0.07)
Basophils Absolute: 0 10*3/uL (ref 0.0–0.1)
Basophils Relative: 0 %
Eosinophils Absolute: 0.1 10*3/uL (ref 0.0–0.5)
Eosinophils Relative: 1 %
HCT: 36.3 % (ref 36.0–46.0)
Hemoglobin: 11.3 g/dL — ABNORMAL LOW (ref 12.0–15.0)
Immature Granulocytes: 0 %
Lymphocytes Relative: 29 %
Lymphs Abs: 2.9 10*3/uL (ref 0.7–4.0)
MCH: 26.3 pg (ref 26.0–34.0)
MCHC: 31.1 g/dL (ref 30.0–36.0)
MCV: 84.6 fL (ref 80.0–100.0)
Monocytes Absolute: 0.5 10*3/uL (ref 0.1–1.0)
Monocytes Relative: 5 %
Neutro Abs: 6.4 10*3/uL (ref 1.7–7.7)
Neutrophils Relative %: 65 %
Platelets: 276 10*3/uL (ref 150–400)
RBC: 4.29 MIL/uL (ref 3.87–5.11)
RDW: 15.3 % (ref 11.5–15.5)
WBC: 10.1 10*3/uL (ref 4.0–10.5)
nRBC: 0 % (ref 0.0–0.2)

## 2020-07-21 ENCOUNTER — Telehealth: Payer: Self-pay

## 2020-07-21 LAB — FERRITIN: Ferritin: 26 ng/mL (ref 11–307)

## 2020-07-21 LAB — VITAMIN B12: Vitamin B-12: 348 pg/mL (ref 180–914)

## 2020-07-21 LAB — FOLATE: Folate: 19.6 ng/mL (ref >5.9)

## 2020-07-21 NOTE — Telephone Encounter (Signed)
Left message for patient to return call.

## 2020-07-21 NOTE — Telephone Encounter (Signed)
-----   Message from Lequita Asal, MD sent at 07/21/2020  2:56 PM EDT ----- Regarding: Please call patient  Is she receiving B12 injections at home?  If so, when was her last injection.  M ----- Message ----- From: Buel Ream, Lab In [place] Sent: 07/20/2020   2:05 PM EDT To: Lequita Asal, MD

## 2020-07-22 ENCOUNTER — Telehealth: Payer: Self-pay

## 2020-07-22 NOTE — Telephone Encounter (Signed)
Left message for patient to call back  

## 2020-07-22 NOTE — Telephone Encounter (Signed)
-----   Message from Lequita Asal, MD sent at 07/21/2020  2:56 PM EDT ----- Regarding: Please call patient  Is she receiving B12 injections at home?  If so, when was her last injection.  M ----- Message ----- From: Buel Ream, Lab In [place] Sent: 07/20/2020   2:05 PM EDT To: Lequita Asal, MD

## 2020-08-07 ENCOUNTER — Ambulatory Visit: Payer: Medicare (Managed Care)

## 2020-08-07 ENCOUNTER — Telehealth: Payer: Self-pay

## 2020-08-07 NOTE — Telephone Encounter (Signed)
Unsuccessful attempt to reach patient for scheduled awv. No answer. Unable to leave message. Please reschedule as appropriate.

## 2020-08-10 ENCOUNTER — Ambulatory Visit (INDEPENDENT_AMBULATORY_CARE_PROVIDER_SITE_OTHER): Payer: Medicare (Managed Care)

## 2020-08-10 VITALS — Ht 59.75 in | Wt 189.0 lb

## 2020-08-10 DIAGNOSIS — Z0000 Encounter for general adult medical examination without abnormal findings: Secondary | ICD-10-CM

## 2020-08-10 NOTE — Patient Instructions (Addendum)
Jeanette Spencer , Thank you for taking time to come for your Medicare Wellness Visit. I appreciate your ongoing commitment to your health goals. Please review the following plan we discussed and let me know if I can assist you in the future.   These are the goals we discussed: Goals    . DIET - INCREASE LEAN PROTEINS     Low carb diet    . Increase physical activity     Stay active with walking, chair exercises       This is a list of the screening recommended for you and due dates:  Health Maintenance  Topic Date Due  . DEXA scan (bone density measurement)  Never done  . Mammogram  04/13/2019  . Flu Shot  12/24/2020*  . Eye exam for diabetics  09/16/2020  . Hemoglobin A1C  12/14/2020  . Complete foot exam   07/10/2021  . Tetanus Vaccine  07/13/2023  . Colon Cancer Screening  09/24/2028  . COVID-19 Vaccine  Completed  .  Hepatitis C: One time screening is recommended by Center for Disease Control  (CDC) for  adults born from [birth year] through 1966.   Completed  . Pneumonia vaccines  Completed  *Topic was postponed. The date shown is not the original due date.   Keep all routine maintenance appointments.   Next scheduled lab 09/15/20  Follow up 10/12/20 @ 9:30  Advanced directives: not yet completed.   Follow up in one year for your annual wellness visit.   Preventive Care 32 Years and Older, Female Preventive care refers to lifestyle choices and visits with your health care provider that can promote health and wellness. What does preventive care include?  A yearly physical exam. This is also called an annual well check.  Dental exams once or twice a year.  Routine eye exams. Ask your health care provider how often you should have your eyes checked.  Personal lifestyle choices, including:  Daily care of your teeth and gums.  Regular physical activity.  Eating a healthy diet.  Avoiding tobacco and drug use.  Limiting alcohol use.  Practicing safe sex.  Taking  low-dose aspirin every day.  Taking vitamin and mineral supplements as recommended by your health care provider. What happens during an annual well check? The services and screenings done by your health care provider during your annual well check will depend on your age, overall health, lifestyle risk factors, and family history of disease. Counseling  Your health care provider may ask you questions about your:  Alcohol use.  Tobacco use.  Drug use.  Emotional well-being.  Home and relationship well-being.  Sexual activity.  Eating habits.  History of falls.  Memory and ability to understand (cognition).  Work and work Statistician.  Reproductive health. Screening  You may have the following tests or measurements:  Height, weight, and BMI.  Blood pressure.  Lipid and cholesterol levels. These may be checked every 5 years, or more frequently if you are over 44 years old.  Skin check.  Lung cancer screening. You may have this screening every year starting at age 80 if you have a 30-pack-year history of smoking and currently smoke or have quit within the past 15 years.  Fecal occult blood test (FOBT) of the stool. You may have this test every year starting at age 22.  Flexible sigmoidoscopy or colonoscopy. You may have a sigmoidoscopy every 5 years or a colonoscopy every 10 years starting at age 80.  Hepatitis C blood test.  Hepatitis B blood test.  Sexually transmitted disease (STD) testing.  Diabetes screening. This is done by checking your blood sugar (glucose) after you have not eaten for a while (fasting). You may have this done every 1-3 years.  Bone density scan. This is done to screen for osteoporosis. You may have this done starting at age 48.  Mammogram. This may be done every 1-2 years. Talk to your health care provider about how often you should have regular mammograms. Talk with your health care provider about your test results, treatment options, and  if necessary, the need for more tests. Vaccines  Your health care provider may recommend certain vaccines, such as:  Influenza vaccine. This is recommended every year.  Tetanus, diphtheria, and acellular pertussis (Tdap, Td) vaccine. You may need a Td booster every 10 years.  Zoster vaccine. You may need this after age 31.  Pneumococcal 13-valent conjugate (PCV13) vaccine. One dose is recommended after age 23.  Pneumococcal polysaccharide (PPSV23) vaccine. One dose is recommended after age 56. Talk to your health care provider about which screenings and vaccines you need and how often you need them. This information is not intended to replace advice given to you by your health care provider. Make sure you discuss any questions you have with your health care provider. Document Released: 10/09/2015 Document Revised: 06/01/2016 Document Reviewed: 07/14/2015 Elsevier Interactive Patient Education  2017 [place] Prevention in the Home Falls can cause injuries. They can happen to people of all ages. There are many things you can do to make your home safe and to help prevent falls. What can I do on the outside of my home?  Regularly fix the edges of walkways and driveways and fix any cracks.  Remove anything that might make you trip as you walk through a door, such as a raised step or threshold.  Trim any bushes or trees on the path to your home.  Use bright outdoor lighting.  Clear any walking paths of anything that might make someone trip, such as rocks or tools.  Regularly check to see if handrails are loose or broken. Make sure that both sides of any steps have handrails.  Any raised decks and porches should have guardrails on the edges.  Have any leaves, snow, or ice cleared regularly.  Use sand or salt on walking paths during winter.  Clean up any spills in your garage right away. This includes oil or grease spills. What can I do in the bathroom?  Use night  lights.  Install grab bars by the toilet and in the tub and shower. Do not use towel bars as grab bars.  Use non-skid mats or decals in the tub or shower.  If you need to sit down in the shower, use a plastic, non-slip stool.  Keep the floor dry. Clean up any water that spills on the floor as soon as it happens.  Remove soap buildup in the tub or shower regularly.  Attach bath mats securely with double-sided non-slip rug tape.  Do not have throw rugs and other things on the floor that can make you trip. What can I do in the bedroom?  Use night lights.  Make sure that you have a light by your bed that is easy to reach.  Do not use any sheets or blankets that are too big for your bed. They should not hang down onto the floor.  Have a firm chair that has side arms. You can use this  for support while you get dressed.  Do not have throw rugs and other things on the floor that can make you trip. What can I do in the kitchen?  Clean up any spills right away.  Avoid walking on wet floors.  Keep items that you use a lot in easy-to-reach places.  If you need to reach something above you, use a strong step stool that has a grab bar.  Keep electrical cords out of the way.  Do not use floor polish or wax that makes floors slippery. If you must use wax, use non-skid floor wax.  Do not have throw rugs and other things on the floor that can make you trip. What can I do with my stairs?  Do not leave any items on the stairs.  Make sure that there are handrails on both sides of the stairs and use them. Fix handrails that are broken or loose. Make sure that handrails are as long as the stairways.  Check any carpeting to make sure that it is firmly attached to the stairs. Fix any carpet that is loose or worn.  Avoid having throw rugs at the top or bottom of the stairs. If you do have throw rugs, attach them to the floor with carpet tape.  Make sure that you have a light switch at the  top of the stairs and the bottom of the stairs. If you do not have them, ask someone to add them for you. What else can I do to help prevent falls?  Wear shoes that:  Do not have high heels.  Have rubber bottoms.  Are comfortable and fit you well.  Are closed at the toe. Do not wear sandals.  If you use a stepladder:  Make sure that it is fully opened. Do not climb a closed stepladder.  Make sure that both sides of the stepladder are locked into place.  Ask someone to hold it for you, if possible.  Clearly mark and make sure that you can see:  Any grab bars or handrails.  First and last steps.  Where the edge of each step is.  Use tools that help you move around (mobility aids) if they are needed. These include:  Canes.  Walkers.  Scooters.  Crutches.  Turn on the lights when you go into a dark area. Replace any light bulbs as soon as they burn out.  Set up your furniture so you have a clear path. Avoid moving your furniture around.  If any of your floors are uneven, fix them.  If there are any pets around you, be aware of where they are.  Review your medicines with your doctor. Some medicines can make you feel dizzy. This can increase your chance of falling. Ask your doctor what other things that you can do to help prevent falls. This information is not intended to replace advice given to you by your health care provider. Make sure you discuss any questions you have with your health care provider. Document Released: 07/09/2009 Document Revised: 02/18/2016 Document Reviewed: 10/17/2014 Elsevier Interactive Patient Education  2017 [employer].

## 2020-08-10 NOTE — Progress Notes (Addendum)
Subjective:   Jeanette Spencer is a 69 y.o. female who presents for Medicare Annual (Subsequent) preventive examination.  Review of Systems    No ROS.  Medicare Wellness Virtual Visit.   Cardiac Risk Factors include: advanced age (>20mn, >>46women);hypertension;diabetes mellitus     Objective:    Today's Vitals   08/10/20 1232  Weight: 189 lb (85.7 kg)  Height: 4' 11.75" (1.518 m)   Body mass index is 37.22 kg/m.  Advanced Directives 08/10/2020 04/17/2020 01/06/2020 08/07/2019 09/24/2018 05/30/2018 06/27/2016  Does Patient Have a Medical Advance Directive? No No No No No No No  Would patient like information on creating a medical advance directive? No - Patient declined No - Patient declined No - Patient declined No - Patient declined No - Patient declined No - Patient declined No - patient declined information    Current Medications (verified) Outpatient Encounter Medications as of 08/10/2020  Medication Sig   amLODipine (NORVASC) 5 MG tablet Take 1 tablet (5 mg total) by mouth daily.   atorvastatin (LIPITOR) 20 MG tablet Take 1 tablet (20 mg total) by mouth daily.   Cyanocobalamin (B-12 COMPLIANCE INJECTION) 1000 MCG/ML KIT Inject 1,000 mcg as directed every 30 (thirty) days.   Flax Oil-Fish Oil-Borage Oil (FISH OIL-FLAX OIL-BORAGE OIL) CAPS Take 1 capsule by mouth daily.   glipiZIDE (GLUCOTROL) 5 MG tablet Take 5 mg by mouth daily before breakfast.   glucose blood test strip Use as instructed to check sugars twice daily.  Uncontrolled diabetes mellitus E11.65   losartan (COZAAR) 25 MG tablet Take 1 tablet (25 mg total) by mouth daily.   metFORMIN (GLUCOPHAGE) 500 MG tablet Take 1 tablet (500 mg total) by mouth 2 (two) times daily with a meal.   Multiple Vitamin (MULTIVITAMIN) tablet Take 1 tablet by mouth daily.   No facility-administered encounter medications on file as of 08/10/2020.    Allergies (verified) Oxycodone, Glipizide, and Lipitor [atorvastatin]    History: Past Medical History:  Diagnosis Date   Allergy    Arthritis    knees   Chicken pox    Chronic bronchitis (HCC)    [place]    type 2   Hypertension    Phlebitis    [place]   after abdominal tumor removal   Past Surgical History:  Procedure Laterality Date   ABDOMINAL HYSTERECTOMY     abdominal tumor Left    size of grapefruit   COLONOSCOPY WITH PROPOFOL N/A 09/24/2018   Procedure: COLONOSCOPY WITH BIOPSY;  Surgeon: WLucilla Lame MD;  [facility]  Service: Endoscopy;  Laterality: N/A;  diabetic - oral meds  requests arrival after 8:30   POLYPECTOMY N/A 09/24/2018   Procedure: POLYPECTOMY;  Surgeon: WLucilla Lame MD;  [facility]  Service: Endoscopy;  Laterality: N/A;   TONSILECTOMY, ADENOIDECTOMY, BILATERAL MYRINGOTOMY AND TUBES     Family History  Problem Relation Age of Onset   Diabetes Mother    Cancer Father    Cancer Paternal Aunt    Cancer Paternal Uncle    Diabetes Paternal Grandmother    Social History   Socioeconomic History   Marital status: Single    Spouse name: Not on file   Number of children: Not on file   Years of education: Not on file   Highest education level: Not on file  Occupational History   Not on file  Tobacco Use   Smoking status: Never Smoker   Smokeless tobacco:  Never Used  Vaping Use   Vaping Use: Never used  Substance and Sexual Activity   Alcohol use: Yes    Comment:  rarely - Holidays   Drug use: No   Sexual activity: Never  Other Topics Concern   Not on file  Social History Narrative   Not on file   Social Determinants of Health   Financial Resource Strain: Low Risk    Difficulty of Paying Living Expenses: Not hard at all  Food Insecurity: No Food Insecurity   Worried About Charity fundraiser in the Last Year: Never true   [place] in the Last Year: Never true  Transportation Needs: No Transportation  Needs   Lack of Transportation (Medical): No   Lack of Transportation (Non-Medical): No  Physical Activity: Unknown   Days of Exercise per Week: 0 days   Minutes of Exercise per Session: Not on file  Stress: No Stress Concern Present   Feeling of Stress : Not at all  Social Connections: Unknown   Frequency of Communication with Friends and Family: More than three times a week   Frequency of Social Gatherings with Friends and Family: More than three times a week   Attends Religious Services: Not on Electrical engineer or Organizations: Not on file   Attends Archivist Meetings: Not on file   Marital Status: Divorced    Tobacco Counseling Counseling given: Not Answered   Clinical Intake:  Pre-visit preparation completed: Yes    Nutrition Risk Assessment: Does the patient have any non-healing wounds?  No  Has the patient had any unintentional weight loss or weight gain?  No   Diabetes: If diabetic, was a CBG obtained today?  No  Did the patient bring in their glucometer from home?  No . Virtual visit.   Financial Strains and Diabetes Management: Does the patient nee to be seen by Chronic Care Management for management of their diabetes?  No  Would the patient like to be referred to a Nutritionist or for Diabetic Management?  No   Diabetic Exams: Diabetic Eye Exam: Completed 10/17/18 Diabetic Foot Exam: Completed 07/10/21   Diabetes: Yes (Followed by pcp)  How often do you need to have someone help you when you read instructions, pamphlets, or other written materials from your doctor or pharmacy?: 1 - Never   Interpreter Needed?: No      Activities of Daily Living In your present state of health, do you have any difficulty performing the following activities: 08/10/2020  Hearing? N  Vision? N  Difficulty concentrating or making decisions? N  Walking or climbing stairs? N  Dressing or bathing? N  Doing errands, shopping? N  Preparing Food and  eating ? N  Using the Toilet? N  In the past six months, have you accidently leaked urine? N  Do you have problems with loss of bowel control? N  Managing your Medications? N  Managing your Finances? N  Housekeeping or managing your Housekeeping? N  Some recent data might be hidden    Patient Care Team: [name], MD as PCP - General (Internal Medicine) Lequita Asal, MD as Referring Physician (Hematology and Oncology) Anthonette Legato, MD (Nephrology)  Indicate any recent Medical Services you may have received from other than [facility] providers in the past year (date may be approximate).     Assessment:   This is a routine wellness examination for Jeanette Spencer.  I connected with Jeanette Spencer today  by telephone and verified that I am speaking with the correct person using two identifiers. Location patient: home Location provider: work Persons participating in the virtual visit: patient, Jeanette Spencer.    I discussed the limitations, risks, security and privacy concerns of performing an evaluation and management service by telephone and the availability of in person appointments. The patient expressed understanding and verbally consented to this telephonic visit.    Interactive audio and video telecommunications were attempted between this provider and patient, however failed, due to patient having technical difficulties OR patient did not have access to video capability.  We continued and completed visit with audio only.  Some vital signs may be absent or patient reported.   Hearing/Vision screen  Hearing Screening   125Hz  250Hz  500Hz  1000Hz  2000Hz  3000Hz  4000Hz  6000Hz  8000Hz   Right ear:           Left ear:           Comments: Patient is able to hear conversational tones without difficulty. No issues reported.   Vision Screening Comments: Followed by [facility]  Diabetic eye exam  Wears corrective lenses  Virtual visit.  They have regular follow up with the ophthalmologist.  Dietary  issues and exercise activities discussed: Current Exercise Habits: Home exercise routine, Type of exercise: stretching (Chair exercises), Intensity: Mild  Healthy diet Good water intake  Goals      DIET - INCREASE LEAN PROTEINS     Low carb diet     Increase physical activity     Stay active with walking, chair exercises       Depression Screen PHQ 2/9 Scores 08/10/2020 08/07/2019 05/30/2018 09/27/2017  PHQ - 2 Score 0 0 0 0  PHQ- 9 Score - - - 1    Fall Risk Fall Risk  08/10/2020 07/10/2020 07/10/2020 04/17/2020 09/02/2019  Falls in the past year? 0 0 0 0 0  Number falls in past yr: 0 - - - -  Follow up Falls evaluation completed Falls evaluation completed Falls evaluation completed Falls evaluation completed Falls evaluation completed   Handrails in use when climbing stairs? Yes Home free of loose throw rugs in walkways, pet beds, electrical cords, etc? Yes  Adequate lighting in your home to reduce risk of falls? Yes   ASSISTIVE DEVICES UTILIZED TO PREVENT FALLS: Life alert? No  Use of a cane, walker or w/c? No   TIMED UP AND GO: Was the test performed? No . Virtual visit.   Cognitive Function: Patient is alert and oriented x3.  Denies difficulty focusing making decisions, memory loss.  Enjoys brain challenging activities for brain health.  MMSE/6CIT deferred. Normal by direct communication/observation.  MMSE - Mini Mental State Exam 05/30/2018  Orientation to time 5  Orientation to Place 5  Registration 3  Attention/ Calculation 5  Recall 2  Language- name 2 objects 2  Language- repeat 1  Language- follow 3 step command 3  Language- read & follow direction 1  Write a sentence 1  Copy design 1  Total score 29     6CIT Screen 08/07/2019  What Year? 0 points  What month? 0 points  What time? 0 points  Count back from 20 0 points  Months in reverse 0 points  Repeat phrase 2 points  Total Score 2    Immunizations Immunization History  Administered Date(s)  Administered   Influenza Whole 07/23/2012   Influenza-Unspecified 07/11/2013, 07/10/2014, 07/11/2017, 07/11/2018, 07/16/2019   Moderna SARS-COVID-2 Vaccination 12/25/2019, 01/22/2020   Pneumococcal Conjugate-13 07/12/2013  Pneumococcal Polysaccharide-23 09/27/2017   Tdap 05/23/2005, 07/12/2013   Health Maintenance Health Maintenance  Topic Date Due   DEXA SCAN  Never done   MAMMOGRAM  04/13/2019   INFLUENZA VACCINE  12/24/2020 (Originally 04/26/2020)   OPHTHALMOLOGY EXAM  09/16/2020   HEMOGLOBIN A1C  12/14/2020   FOOT EXAM  07/10/2021   TETANUS/TDAP  07/13/2023   COLONOSCOPY  09/24/2028   COVID-19 Vaccine  Completed   Hepatitis C Screening  Completed   PNA vac Low Risk Adult  Completed   Colorectal cancer screening: Completed 09/24/18. Repeat every 10 years   Mammogram- ordered 07/10/20. Screening.Bilateral. Number provided for scheduling.   Bone density- ordered 10/15/2. Number provided for scheduling.   Lung Cancer Screening: (Low Dose CT Chest recommended if Age 45-80 years, 30 pack-year currently smoking OR have quit w/in 15years.) does not qualify.    Hepatitis C Screening: Completed 09/26/19  Dental Screening: Recommended annual dental exams for proper oral hygiene.  Community Resource Referral / Chronic Care Management: CRR required this visit?  No   CCM required this visit?  No      Plan:   Keep all routine maintenance appointments.   Next scheduled lab 09/15/20  Follow up 10/12/20 @ 9:30  I have personally reviewed and noted the following in the patient's chart:   Medical and social history Use of alcohol, tobacco or illicit drugs  Current medications and supplements Functional ability and status Nutritional status Physical activity Advanced directives List of other physicians Hospitalizations, surgeries, and ER visits in previous 12 months Vitals Screenings to include cognitive, depression, and falls Referrals and appointments  In addition, I  have reviewed and discussed with patient certain preventive protocols, quality metrics, and best practice recommendations. A written personalized care plan for preventive services as well as general preventive health recommendations were provided to patient via mychart.     OBrien-Blaney, Lasondra Hodgkins L, LPN   [id number]    I have reviewed the above information and agree with above.   Deborra Medina, MD

## 2020-08-27 ENCOUNTER — Other Ambulatory Visit: Payer: Self-pay

## 2020-08-27 ENCOUNTER — Telehealth: Payer: Self-pay

## 2020-08-27 ENCOUNTER — Telehealth: Payer: Medicare (Managed Care) | Admitting: Family Medicine

## 2020-08-27 NOTE — Telephone Encounter (Signed)
patient has appointment today with Dr Ermalinda [facility] Night - Cl TELEPHONE ADVICE RECORD Access[employer]urse Patient Name: Jeanette Spencer Gender: Female DOB: [birth year] Age: 69 Y 9 M 22 D Return Phone Number: [phone] (Primary) Address: City/State/Zip: [place] [address] Client [facility] - [facility] Client Site [place] Physician Deborra Medina - MD Contact Type Call Who Is Calling Patient / Member / Family / Caregiver Call Type Triage / Clinical Relationship To Patient Self Return Phone Number [phone] (Primary) Chief Complaint Cough Reason for Call Request to Schedule Office Appointment Initial Comment Needing to make appt for asap, Having a cough, left side hurting and a earache. Translation No Nurse Assessment Nurse: Ysidro Evert, RN, Levada Dy Date/Time (Eastern Time): 08/27/2020 7:38:30 AM Confirm and document reason for call. If symptomatic, describe symptoms. ---Caller states she has an ear ache and cough and congestion that started about 3 days ago. Does the patient have any new or worsening symptoms? ---Yes Will a triage be completed? ---Yes Related visit to physician within the last 2 weeks? ---No Does the PT have any chronic conditions? (i.e. diabetes, asthma, this includes High risk factors for pregnancy, etc.) ---Yes List chronic conditions. ---diabetes Is this a behavioral health or substance abuse call? ---No Guidelines Guideline Title Affirmed Question Affirmed Notes Nurse Date/Time (Eastern Time) Earache Diabetes mellitus or weak immune system (e.g., HIV positive, cancer chemo, splenectomy, organ transplant, chronic steroids) Ysidro Evert, RN, Levada Dy 08/27/2020 7:40:27 AM Disp. Time [name] Ghazi Time) Disposition Final User 08/27/2020 7:26:42 AM Attempt made - message left Earleen Reaper 08/27/2020 7:42:13 AM See HCP within 4 Hours (or PCP triage) Yes  Ysidro Evert, RN, Levada Dy PLEASE NOTE: All timestamps contained within this report are represented as [place] Standard Time. CONFIDENTIALTY NOTICE: This fax transmission is intended only for the addressee. It contains information that is legally privileged, confidential or otherwise protected from use or disclosure. If you are not the intended recipient, you are strictly prohibited from reviewing, disclosing, copying using or disseminating any of this information or taking any action in reliance on or regarding this information. If you have received this fax in error, please notify us immediately by telephone so that we can arrange for its return to [name]. Phone: [phone], Toll-Free: [phone], Fax: [phone] Page: 2 of 2 Call Id: [id number] [place] Disagree/Comply Comply Caller Understands Yes PreDisposition Did not know what to do Care Advice Given Per Guideline SEE HCP (OR PCP TRIAGE) WITHIN 4 HOURS: CALL BACK IF: * You become worse CARE ADVICE given per Earache (Adult) guideline. Referrals REFERRED TO PCP OFFICE

## 2020-09-15 ENCOUNTER — Other Ambulatory Visit (INDEPENDENT_AMBULATORY_CARE_PROVIDER_SITE_OTHER): Payer: Medicare (Managed Care)

## 2020-09-15 ENCOUNTER — Other Ambulatory Visit: Payer: Self-pay

## 2020-09-15 ENCOUNTER — Telehealth: Payer: Self-pay

## 2020-09-15 DIAGNOSIS — E1121 Type 2 diabetes mellitus with diabetic nephropathy: Secondary | ICD-10-CM

## 2020-09-15 DIAGNOSIS — E1169 Type 2 diabetes mellitus with other specified complication: Secondary | ICD-10-CM | POA: Diagnosis not present

## 2020-09-15 DIAGNOSIS — E785 Hyperlipidemia, unspecified: Secondary | ICD-10-CM

## 2020-09-15 LAB — COMPREHENSIVE METABOLIC PANEL
ALT: 13 U/L (ref 0–35)
AST: 13 U/L (ref 0–37)
Albumin: 4.3 g/dL (ref 3.5–5.2)
Alkaline Phosphatase: 67 U/L (ref 39–117)
BUN: 25 mg/dL — ABNORMAL HIGH (ref 6–23)
CO2: 29 mEq/L (ref 19–32)
Calcium: 9.8 mg/dL (ref 8.4–10.5)
Chloride: 105 mEq/L (ref 96–112)
Creatinine, Ser: 1.25 mg/dL — ABNORMAL HIGH (ref 0.40–1.20)
GFR: 43.56 mL/min — ABNORMAL LOW (ref >60.00)
Glucose, Bld: 147 mg/dL — ABNORMAL HIGH (ref 70–99)
Potassium: 4.8 mEq/L (ref 3.5–5.1)
Sodium: 138 mEq/L (ref 135–145)
Total Bilirubin: 0.4 mg/dL (ref 0.2–1.2)
Total Protein: 7.4 g/dL (ref 6.0–8.3)

## 2020-09-15 LAB — LIPID PANEL
Cholesterol: 179 mg/dL (ref 0–200)
HDL: 56.7 mg/dL (ref >39.00)
LDL Cholesterol: 85 mg/dL (ref 0–99)
NonHDL: 122.01
Total CHOL/HDL Ratio: 3
Triglycerides: 184 mg/dL — ABNORMAL HIGH (ref 0.0–149.0)
VLDL: 36.8 mg/dL (ref 0.0–40.0)

## 2020-09-15 LAB — HEMOGLOBIN A1C: Hgb A1c MFr Bld: 7.5 % — ABNORMAL HIGH (ref 4.6–6.5)

## 2020-09-15 NOTE — Telephone Encounter (Signed)
Labs ordered for lab appt.  

## 2020-09-19 NOTE — Progress Notes (Signed)
diabetes is still under good control on current regimen, but  a1c is still above 7.0 . Please bring your most recent blood sugar logs with you to your January appt. And make sure you are seeing your nephrologist as recommended  Regards,   Deborra Medina, MD

## 2020-10-12 ENCOUNTER — Ambulatory Visit: Payer: Medicare (Managed Care) | Admitting: Internal Medicine

## 2020-10-15 NOTE — Progress Notes (Signed)
[facility]  [address] [place], [place] [address] Phone: [phone]  Fax: [phone]   Clinic Day:  10/19/2020  Referring physician: [name], MD  Chief Complaint: KHRISTIN KELEHER is a 70 y.o. female with stage IIIb chronic kidney disease who is seen for 6 month assessment.  HPI:  The patient was last seen in the hematology clinic on 04/20/2020. At that time, she was doing "so-so".  She noted nausea when taking oral B12 and iron at the same time.  Exam was stable. Hematocrit was 34.9, hemoglobin 11.1, MCV 83.3, platelets 276,000, WBC 10,300. Ferritin was 28 with an iron saturation of 27% and a TIBC of 301. Vitamin B12 was 326. She continued oral iron. She was prescribed at-home vitamin B12 injections.  The patient saw Dr. Holley Raring on 07/30/2020. Her chronic kidney disease appeared to be stable. There was no indication for Procrit at that time. Follow-up was planned for 4 months.   Labs on 07/20/2020 revealed a hematocrit of 36.3, hemoglobin 11.3, platelets 276,000, WBC 10,100 with an ANC of 6400. Ferritin was 26. Vitamin B12 was 348 and folate 19.6.   During the interim, she has been "ok." She stopped taking oral iron. She uses vitamin B12 injections at home every 4 weeks. She is eating well. She eats [place], chicken, fish, and dark leafy greens. She gets dizzy if she stands up too fast. Her cholesterol medication causes her to be off balance. She has chronic lower back pain.   Past Medical History:  Diagnosis Date  . Allergy   . Arthritis    knees  . Chicken pox   . Chronic bronchitis ([place])   . Diabetes mellitus without complication ([place])    type 2  . Hypertension   . Phlebitis   . 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 ABDOMINAL HYSTERECTOMY    . abdominal tumor Left    size of grapefruit  . COLONOSCOPY WITH PROPOFOL N/A 09/24/2018   Procedure:  COLONOSCOPY WITH BIOPSY;  Surgeon: [name], MD;  Location: [facility];  Service: Endoscopy;  Laterality: N/A;  diabetic - oral meds  requests arrival after 8:30  . POLYPECTOMY N/A 09/24/2018   Procedure: POLYPECTOMY;  Surgeon: [name], MD;  Location: [facility];  Service: Endoscopy;  Laterality: N/A;  . TONSILECTOMY, ADENOIDECTOMY, BILATERAL MYRINGOTOMY AND TUBES      Family History  Problem Relation Age of Onset  . Diabetes Mother   . Cancer Father   . Cancer Paternal Aunt   . Cancer Paternal Uncle   . Diabetes Paternal Grandmother   Her father had prostate cancer. Her paternal aunt had breast cancer and her uncle had cancer.    Social History:  reports that she has never smoked. She has never used smokeless tobacco. She reports current alcohol use. She reports that she does not use drugs. 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 She denies any exposure to radiation or toxins. She lives in [place].  The patient is alone today.  Allergies:  Allergies  Allergen Reactions  . Oxycodone   . Glipizide     Recurrent hypoglycemia  . Lipitor [Atorvastatin]     Affected balance    Current Medications: Current Outpatient Medications  Medication Sig Dispense Refill  . amLODipine (NORVASC) 5 MG tablet Take 1 tablet (5 mg total) by mouth daily. 90 tablet 3  .  atorvastatin (LIPITOR) 20 MG tablet Take 1 tablet (20 mg total) by mouth daily. 90 tablet 3  . Cyanocobalamin (B-12 COMPLIANCE INJECTION) 1000 MCG/ML KIT Inject 1,000 mcg as directed every 30 (thirty) days. 1 kit 5  . Flax Oil-Fish Oil-Borage Oil (FISH OIL-FLAX OIL-BORAGE OIL) CAPS Take 1 capsule by mouth daily.    [name] glipiZIDE (GLUCOTROL) 5 MG tablet Take 5 mg by mouth as needed.    [name] glucose blood test strip Use as instructed to check sugars twice daily.  Uncontrolled diabetes mellitus E11.65 100 each 12  . losartan (COZAAR) 25 MG tablet Take 1 tablet (25 mg total) by mouth daily. 90 tablet 1  .  metFORMIN (GLUCOPHAGE) 500 MG tablet Take 1 tablet (500 mg total) by mouth 2 (two) times daily with a meal. 180 tablet 1  . Multiple Vitamin (MULTIVITAMIN) tablet Take 1 tablet by mouth daily.     No current facility-administered medications for this visit.    Review of Systems  Constitutional: Negative.  Negative for chills, diaphoresis, fever, malaise/fatigue and weight loss (stable).       Feels "ok."  HENT: Negative.  Negative for congestion, ear discharge, ear pain, hearing loss, nosebleeds, sinus pain, sore throat and tinnitus.   Eyes: Negative.  Negative for blurred vision, double vision and photophobia.  Respiratory: Negative.  Negative for cough, hemoptysis, sputum production and shortness of breath.   Cardiovascular: Negative for chest pain, palpitations and leg swelling.  Gastrointestinal: Negative.  Negative for abdominal pain, blood in stool, constipation, diarrhea, heartburn, melena, nausea and vomiting.       Eating well. Eats iron rich foods.  Genitourinary: Negative.  Negative for dysuria, frequency, hematuria and urgency.  Musculoskeletal: Positive for back pain (chronic lower back). Negative for joint pain, myalgias and neck pain.  Skin: Negative.  Negative for itching and rash.  Neurological: Positive for dizziness. Negative for tingling, sensory change, focal weakness, weakness and headaches.       Balance problems due to cholesterol medication  Endo/Heme/Allergies: Negative.  Negative for environmental allergies. Does not bruise/bleed easily.  Psychiatric/Behavioral: Negative.  Negative for depression and memory loss. The patient is not nervous/anxious and does not have insomnia.   All other systems reviewed and are negative.  Performance status (ECOG): 1  Vitals Blood pressure (!) 153/80, pulse 78, temperature 98.2 F (36.8 C), temperature source Oral, weight 190 lb 5.9 oz (86.4 kg), SpO2 100 %.   Physical Exam Vitals and nursing note reviewed.  Constitutional:       General: She is not in acute distress.    Appearance: She is well-developed. She is not diaphoretic.  HENT:     Head: Normocephalic and atraumatic.     Comments: Short white hair.    Mouth/Throat:     Mouth: Mucous membranes are moist.  Eyes:     General: No scleral icterus.    Extraocular Movements: Extraocular movements intact.     Conjunctiva/sclera: Conjunctivae normal.     Pupils: Pupils are equal, round, and reactive to light.     Comments: Glasses. Brown eyes.  Cardiovascular:     Rate and Rhythm: Normal rate and regular rhythm.     Pulses: Normal pulses.     Heart sounds: Normal heart sounds.  Pulmonary:     Effort: Pulmonary effort is normal. No respiratory distress.     Breath sounds: Normal breath sounds. No wheezing or rales.  Chest:  Breasts:     Right: No axillary adenopathy or supraclavicular adenopathy.  Left: No axillary adenopathy or supraclavicular adenopathy.    Abdominal:     General: Abdomen is flat. Bowel sounds are normal. There is no distension.     Palpations: Abdomen is soft. There is no mass.     Tenderness: There is no abdominal tenderness. There is no guarding.  Musculoskeletal:        General: No swelling or tenderness. Normal range of motion.     Cervical back: Normal range of motion and neck supple. No tenderness.     Right lower leg: No edema.     Left lower leg: No edema.  Lymphadenopathy:     Head:     Right side of head: No preauricular, posterior auricular or occipital adenopathy.     Left side of head: No preauricular, posterior auricular or occipital adenopathy.     Cervical: No cervical adenopathy.     Upper Body:     Right upper body: No supraclavicular or axillary adenopathy.     Left upper body: No supraclavicular or axillary adenopathy.     Lower Body: No right inguinal adenopathy. No left inguinal adenopathy.  Skin:    General: Skin is warm and dry.  Neurological:     Mental Status: She is alert and oriented to  person, place, and time.  Psychiatric:        Behavior: Behavior normal.        Thought Content: Thought content normal.        Judgment: Judgment normal.    Appointment on 10/19/2020  Component Date Value Ref Range Status  . WBC 10/19/2020 9.6  4.0 - 10.5 K/uL Final  . RBC 10/19/2020 4.52  3.87 - 5.11 MIL/uL Final  . Hemoglobin 10/19/2020 11.9* 12.0 - 15.0 g/dL Final  . HCT 10/19/2020 37.9  36.0 - 46.0 % Final  . MCV 10/19/2020 83.8  80.0 - 100.0 fL Final  . MCH 10/19/2020 26.3  26.0 - 34.0 pg Final  . MCHC 10/19/2020 31.4  30.0 - 36.0 g/dL Final  . RDW 10/19/2020 14.9  11.5 - 15.5 % Final  . Platelets 10/19/2020 286  150 - 400 K/uL Final  . nRBC 10/19/2020 0.0  0.0 - 0.2 % Final   Performed at [facility], [address], [place], [place] [address]    Assessment:  ZAREEN JAMISON is a 70 y.o. female with stage IIIb chronic kidney disease and abnormal SPEP/UPEP.  Initial evaluation revealed normal SPEP, UPEP and free light chains.  Labs on 12/13/2019 revealed a hematocrit 34.9, hemoglobin 10.9, MCV 82.5, platelets 310,000, WBC 9,900. Creatinine was 1.18. SPEP revealed a poorly defined band of restricted protein mobility in the gamma globulins.  Random UPEP revealed a possible abnormal protein band in the alpha2 region felt most likely due to hemoglobin.  Work up on 01/06/2020 revealed hematocrit 35.7, hemoglobin 11.3, MCV 83.2, platelets 306,000, WBC 10,000.  Ferritin was 31 with iron saturation of 20% and a TIBC of 308. Kappa free light chain 55.1, lambda free light chain 30.4 with ratio of 1.81 (0.26-1.65). SPEP was normal. Retic was 1.5%.  Vitamin B12 was 262 (low) and folate 12.1.  24 hour urine for UPEP and free light chanis on 01/08/2020 was normal.  She has iron deficiency anemia.  Ferritin has been followed:  56.9 on 04/11/2013, 37 on 09/26/2019, 31 on 01/06/2020, 29 on 02/03/2020, 28 on 04/20/2020, and 26 on 07/20/2020.  She was on oral iron.  Colonoscopy on  09/24/2018 revealed a 4 mm polyp (  hyperplastic polyp) in the sigmoid colon and diverticulosis in the sigmoid colon.  She has B12 deficiency.  B12 was 155 on 04/11/2013, 262 on 01/06/2020 and 326 on 04/20/2020.  She previously gave herself B12 injections; she stopped 3 years ago. She stopped oral B12 in 09/2019 and again recently secondary to diarrhea when she takes oral iron at the same time.   She had her last COVID-19 vaccine in 12/2019 Levan Hurst).  Symptomatically, she feels "ok." She stopped taking oral iron.  Exam is stable.  Plan: 1.   Labs today: CBC with diff, ferritin, iron stores. 2.   Mild normocytic anemia  Hematocrit 35.7.  Hemoglobin 11.3.  MCV 83.2 on 01/06/2020.    Hematocrit 34.9.  Hemoglobin 11.1.  MCV 83.3 on 04/20/2020.   Hematocrit 37.9.  Hemoglobin 11.9.  MCV 83.8 on 10/19/2020.  Her hematocrit has improved with time.  She denies any bleeding.  Diet is good.  Continue to monitor. 3.   B12 deficiency  B12 was 262 on 01/06/2020.  She stopped taking B12 injections at home.  Continue B12 injections.  Assist with refill.  Folate was 19.6 on 07/20/2020.    Check folate annually. 4.   Iron deficiency  Hematocrit 37.9.  Hemoglobin 11.9.  MCV 83.8.  Ferritin 23 with an iron saturation of 11% and a TIBC of 337.  Patient stopped oral iron.  Anticipate reinstitution of oral iron giving modest iron stores. 5.   RN:  patient needs refill of B12 injections for home use. 6.   RTC in 6 months for labs (CBC with diff, ferritin, iron studies). 7.   RTC in 12 months for MD assess and labs (CBC with diff, ferritin, folate).  I discussed the assessment and treatment plan with the patient.  The patient was provided an opportunity to ask questions and all were answered.  The patient agreed with the plan and demonstrated an understanding of the instructions.  The patient was advised to call back if the symptoms worsen or if the condition fails to improve as anticipated.    Lequita Asal, MD, PhD  10/19/2020; 2:01 PM   I, De Burrs, am acting as scribe for [employer]. Mike Gip, MD, PhD.  I, Ajmal Kathan C. Mike Gip, MD, have reviewed the above documentation for accuracy and completeness, and I agree with the above.

## 2020-10-18 ENCOUNTER — Other Ambulatory Visit: Payer: Self-pay | Admitting: Hematology and Oncology

## 2020-10-18 DIAGNOSIS — D509 Iron deficiency anemia, unspecified: Secondary | ICD-10-CM

## 2020-10-19 ENCOUNTER — Inpatient Hospital Stay: Payer: Medicare (Managed Care) | Attending: Hematology and Oncology | Admitting: Hematology and Oncology

## 2020-10-19 ENCOUNTER — Encounter: Payer: Self-pay | Admitting: Hematology and Oncology

## 2020-10-19 ENCOUNTER — Other Ambulatory Visit: Payer: Self-pay

## 2020-10-19 ENCOUNTER — Telehealth: Payer: Self-pay | Admitting: Hematology and Oncology

## 2020-10-19 ENCOUNTER — Inpatient Hospital Stay: Payer: Medicare (Managed Care)

## 2020-10-19 VITALS — BP 153/80 | HR 78 | Temp 98.2°F | Wt 190.4 lb

## 2020-10-19 DIAGNOSIS — D649 Anemia, unspecified: Secondary | ICD-10-CM

## 2020-10-19 DIAGNOSIS — E1122 Type 2 diabetes mellitus with diabetic chronic kidney disease: Secondary | ICD-10-CM | POA: Diagnosis not present

## 2020-10-19 DIAGNOSIS — Z809 Family history of malignant neoplasm, unspecified: Secondary | ICD-10-CM | POA: Insufficient documentation

## 2020-10-19 DIAGNOSIS — Z803 Family history of malignant neoplasm of breast: Secondary | ICD-10-CM | POA: Diagnosis not present

## 2020-10-19 DIAGNOSIS — D509 Iron deficiency anemia, unspecified: Secondary | ICD-10-CM | POA: Insufficient documentation

## 2020-10-19 DIAGNOSIS — K635 Polyp of colon: Secondary | ICD-10-CM | POA: Diagnosis not present

## 2020-10-19 DIAGNOSIS — R42 Dizziness and giddiness: Secondary | ICD-10-CM | POA: Insufficient documentation

## 2020-10-19 DIAGNOSIS — Z79899 Other long term (current) drug therapy: Secondary | ICD-10-CM | POA: Diagnosis not present

## 2020-10-19 DIAGNOSIS — E538 Deficiency of other specified B group vitamins: Secondary | ICD-10-CM | POA: Insufficient documentation

## 2020-10-19 DIAGNOSIS — Z885 Allergy status to narcotic agent status: Secondary | ICD-10-CM | POA: Insufficient documentation

## 2020-10-19 DIAGNOSIS — Z833 Family history of diabetes mellitus: Secondary | ICD-10-CM | POA: Insufficient documentation

## 2020-10-19 DIAGNOSIS — Z86711 Personal history of pulmonary embolism: Secondary | ICD-10-CM | POA: Insufficient documentation

## 2020-10-19 DIAGNOSIS — R110 Nausea: Secondary | ICD-10-CM | POA: Diagnosis not present

## 2020-10-19 DIAGNOSIS — I129 Hypertensive chronic kidney disease with stage 1 through stage 4 chronic kidney disease, or unspecified chronic kidney disease: Secondary | ICD-10-CM | POA: Insufficient documentation

## 2020-10-19 DIAGNOSIS — N1832 Chronic kidney disease, stage 3b: Secondary | ICD-10-CM | POA: Diagnosis present

## 2020-10-19 DIAGNOSIS — G8929 Other chronic pain: Secondary | ICD-10-CM | POA: Insufficient documentation

## 2020-10-19 DIAGNOSIS — M545 Low back pain, unspecified: Secondary | ICD-10-CM | POA: Diagnosis not present

## 2020-10-19 LAB — IRON AND TIBC
Iron: 37 ug/dL (ref 28–170)
Saturation Ratios: 11 % (ref 10.4–31.8)
TIBC: 337 ug/dL (ref 250–450)
UIBC: 300 ug/dL

## 2020-10-19 LAB — FERRITIN: Ferritin: 23 ng/mL (ref 11–307)

## 2020-10-19 LAB — CBC
HCT: 37.9 % (ref 36.0–46.0)
Hemoglobin: 11.9 g/dL — ABNORMAL LOW (ref 12.0–15.0)
MCH: 26.3 pg (ref 26.0–34.0)
MCHC: 31.4 g/dL (ref 30.0–36.0)
MCV: 83.8 fL (ref 80.0–100.0)
Platelets: 286 10*3/uL (ref 150–400)
RBC: 4.52 MIL/uL (ref 3.87–5.11)
RDW: 14.9 % (ref 11.5–15.5)
WBC: 9.6 10*3/uL (ref 4.0–10.5)
nRBC: 0 % (ref 0.0–0.2)

## 2020-10-19 MED ORDER — B-12 COMPLIANCE INJECTION 1000 MCG/ML IJ KIT: 1000.0000 ug | PACK | INTRAMUSCULAR | 5 refills | Status: DC

## 2020-10-19 NOTE — Telephone Encounter (Signed)
Per Brandi Harvell(Insurance Authorization) she can continue to see the MD today and go home and call them and then make a decision in reference to her insurance being OON([employer]). I let patient know what was stated.

## 2020-10-19 NOTE — Progress Notes (Signed)
Patient doesn't have concerns for today's visit

## 2020-11-02 ENCOUNTER — Telehealth: Payer: Self-pay

## 2020-11-02 NOTE — Telephone Encounter (Signed)
I spoke with patient and let her know per dr.corcoran she would like for her to restart her oral iron with OJ or vitamin c because her iron stores are low.

## 2020-11-04 ENCOUNTER — Other Ambulatory Visit: Payer: Self-pay

## 2020-11-04 ENCOUNTER — Encounter: Payer: Self-pay | Admitting: Internal Medicine

## 2020-11-04 ENCOUNTER — Ambulatory Visit: Payer: Medicare (Managed Care) | Admitting: Internal Medicine

## 2020-11-04 VITALS — BP 152/74 | HR 92 | Temp 98.4°F | Ht 59.76 in | Wt 189.6 lb

## 2020-11-04 DIAGNOSIS — E1169 Type 2 diabetes mellitus with other specified complication: Secondary | ICD-10-CM

## 2020-11-04 DIAGNOSIS — E1121 Type 2 diabetes mellitus with diabetic nephropathy: Secondary | ICD-10-CM | POA: Diagnosis not present

## 2020-11-04 DIAGNOSIS — E1122 Type 2 diabetes mellitus with diabetic chronic kidney disease: Secondary | ICD-10-CM | POA: Diagnosis not present

## 2020-11-04 DIAGNOSIS — N183 Chronic kidney disease, stage 3 unspecified: Secondary | ICD-10-CM

## 2020-11-04 DIAGNOSIS — R059 Cough, unspecified: Secondary | ICD-10-CM

## 2020-11-04 DIAGNOSIS — I10 Essential (primary) hypertension: Secondary | ICD-10-CM | POA: Diagnosis not present

## 2020-11-04 DIAGNOSIS — E785 Hyperlipidemia, unspecified: Secondary | ICD-10-CM

## 2020-11-04 MED ORDER — LOSARTAN POTASSIUM 50 MG PO TABS: 50.0000 mg | ORAL_TABLET | Freq: Every day | ORAL | 1 refills | Status: DC

## 2020-11-04 MED ORDER — ATORVASTATIN CALCIUM 20 MG PO TABS: 20.0000 mg | ORAL_TABLET | Freq: Every day | ORAL | 3 refills | Status: DC

## 2020-11-04 MED ORDER — AMLODIPINE BESYLATE 5 MG PO TABS: 5.0000 mg | ORAL_TABLET | Freq: Every day | ORAL | 3 refills | Status: DC

## 2020-11-04 MED ORDER — CHERATUSSIN AC 100-10 MG/5ML PO SOLN: 5.0000 mL | Freq: Three times a day (TID) | ORAL | 0 refills | Status: DC | PRN

## 2020-11-04 NOTE — Patient Instructions (Addendum)
Go back  To  Taking  Glipizide  5 mg with your  morning meal .  Check your post meal sugar (2 hours after eating)  Continue metformin  twice daily   Your post meal sugar should be 160 or less if taken 2 hours after eating.  If after 2 weeks  Your morning  blood sugars should be < 130.  If these goals are not met by the end of February,,  Let me know so we can adjust your medications     We are increasing  THE LOSARTAN TO 50 MG IN THE EVENING to [place] IS BP 130/80  To 140/80 not any higher .      We will recheck your a1c in May and follow up then    I have refilled your cough medication

## 2020-11-04 NOTE — Progress Notes (Signed)
Subjective:  Patient ID: Jeanette Spencer, female    DOB: 21-Nov-1950  Age: 70 y.o. MRN: [id number]  CC: The primary encounter diagnosis was CKD stage 3 due to type 2 diabetes mellitus ([place]). Diagnoses of Essential hypertension, benign, Type 2 diabetes mellitus with nephropathy ([place]), Diabetic nephropathy associated with type 2 diabetes mellitus ([place]), Hyperlipidemia associated with type 2 diabetes mellitus ([place]), and Cough were also pertinent to this visit.  HPI Jeanette Spencer presents for follow up on multiple issues including type 2 DM, hypertension and hyperlipidemia  This visit occurred during the SARS-CoV-2 public health emergency.  Safety protocols were in place, including screening questions prior to the visit, additional usage of staff PPE, and extensive cleaning of exam room while observing appropriate contact time as indicated for disinfecting solutions.   Chronic cough: she states that her cough has improved; she is no longer coughing daily.  She was recently treated for left sided pleurisy and cough by urgent care NP in [place]  with a 3 day antibiotoic and  Cough suppressant cheratussin  In early December.  Chest x ray was not done.  COVID TESTED and negative   DM:  Taking metformin twice daily, Not taking glipizide on a regular basis because she was advised to take it at lunch and she does not eat lunch every day.  Has not chercked post prandials . Did not bring blood sugars to visit today.  Not exercising regularly.  Following a low GI diet about 50% of the time.    Hypertension: patient checks blood pressure twice weekly at home.  Readings have been for the most part > 140/80 at rest . Patient is following a reduced salt diet most days and is taking medications as prescribed (amlodipine and losartan)   Outpatient Medications Prior to Visit  Medication Sig Dispense Refill  . cyanocobalamin (,VITAMIN B-12,) 1000 MCG/ML injection SMARTSIG:1 Milliliter(s) IM    . Cyanocobalamin  (B-12 COMPLIANCE INJECTION) 1000 MCG/ML KIT Inject 1,000 mcg as directed every 30 (thirty) days. 1 kit 5  . Flax Oil-Fish Oil-Borage Oil (FISH OIL-FLAX OIL-BORAGE OIL) CAPS Take 1 capsule by mouth daily.    [name] glipiZIDE (GLUCOTROL) 5 MG tablet Take 5 mg by mouth as needed.    [name] glucose blood test strip Use as instructed to check sugars twice daily.  Uncontrolled diabetes mellitus E11.65 100 each 12  . metFORMIN (GLUCOPHAGE) 500 MG tablet Take 1 tablet (500 mg total) by mouth 2 (two) times daily with a meal. 180 tablet 1  . Multiple Vitamin (MULTIVITAMIN) tablet Take 1 tablet by mouth daily.    [name] amLODipine (NORVASC) 5 MG tablet Take 1 tablet (5 mg total) by mouth daily. 90 tablet 3  . atorvastatin (LIPITOR) 20 MG tablet Take 1 tablet (20 mg total) by mouth daily. 90 tablet 3  . losartan (COZAAR) 25 MG tablet Take 1 tablet (25 mg total) by mouth daily. 90 tablet 1   No facility-administered medications prior to visit.    Review of Systems;  Patient denies headache, fevers, malaise, unintentional weight loss, skin rash, eye pain, sinus congestion and sinus pain, sore throat, dysphagia,  hemoptysis , cough, dyspnea, wheezing, chest pain, palpitations, orthopnea, edema, abdominal pain, nausea, melena, diarrhea, constipation, flank pain, dysuria, hematuria, urinary  Frequency, nocturia, numbness, tingling, seizures,  Focal weakness, Loss of consciousness,  Tremor, insomnia, depression, anxiety, and suicidal ideation.      Objective:  BP (!) 152/74 (BP Location: Left Arm, Patient Position: Sitting)   Pulse  92   Temp 98.4 F (36.9 C)   Ht 4' 11.76" (1.518 m)   Wt 189 lb 9.6 oz (86 kg)   SpO2 97%   BMI 37.32 kg/m   BP Readings from Last 3 Encounters:  11/04/20 (!) 152/74  10/19/20 (!) 153/80  07/10/20 132/72    Wt Readings from Last 3 Encounters:  11/04/20 189 lb 9.6 oz (86 kg)  10/19/20 190 lb 5.9 oz (86.4 kg)  08/10/20 189 lb (85.7 kg)    General appearance: alert, cooperative and  appears stated age Ears: normal TM's and external ear canals both ears Throat: lips, mucosa, and tongue normal; teeth and gums normal Neck: no adenopathy, no carotid bruit, supple, symmetrical, trachea midline and thyroid not enlarged, symmetric, no tenderness/mass/nodules Back: symmetric, no curvature. ROM normal. No CVA tenderness. Lungs: clear to auscultation bilaterally Heart: regular rate and rhythm, S1, S2 normal, no murmur, click, rub or gallop Abdomen: soft, non-tender; bowel sounds normal; no masses,  no organomegaly Pulses: 2+ and symmetric Skin: Skin color, texture, turgor normal. No rashes or lesions Lymph nodes: Cervical, supraclavicular, and axillary nodes normal.  Lab Results  Component Value Date   HGBA1C 7.5 (H) 09/15/2020   HGBA1C 7.6 (H) 06/16/2020   HGBA1C 7.6 (H) 08/30/2019    Lab Results  Component Value Date   CREATININE 1.25 (H) 09/15/2020   CREATININE 1.32 (H) 06/16/2020   CREATININE 1.40 (H) 10/31/2019    Lab Results  Component Value Date   WBC 9.6 10/19/2020   HGB 11.9 (L) 10/19/2020   HCT 37.9 10/19/2020   PLT 286 10/19/2020   GLUCOSE 147 (H) 09/15/2020   CHOL 179 09/15/2020   TRIG 184.0 (H) 09/15/2020   HDL 56.70 09/15/2020   LDLDIRECT 172.0 09/27/2017   LDLCALC 85 09/15/2020   ALT 13 09/15/2020   AST 13 09/15/2020   NA 138 09/15/2020   K 4.8 09/15/2020   CL 105 09/15/2020   CREATININE 1.25 (H) 09/15/2020   BUN 25 (H) 09/15/2020   CO2 29 09/15/2020   TSH 2.49 06/16/2020   HGBA1C 7.5 (H) 09/15/2020   MICROALBUR 2.1 (H) 06/16/2020    US RENAL  Result Date: 12/19/2019 CLINICAL DATA:  Chronic renal disease EXAM: RENAL / URINARY TRACT ULTRASOUND COMPLETE COMPARISON:  None. FINDINGS: Right Kidney: Renal measurements: 12.3 x 4.7 x 4.6 cm. = volume: 139 mL . Echogenicity within normal limits. No mass or hydronephrosis visualized. Left Kidney: Renal measurements: 11.6 x 6.1 x 4.5 cm = volume: 162 mL. No mass lesion or hydronephrosis is noted.  Seventeen calcification is noted in the lower pole without obstructive change. Bladder: Appears normal for degree of bladder distention. Other: None. IMPRESSION: Nonobstructing left renal stone. No other focal abnormality is noted. Electronically Signed   By: Inez Catalina M.D.   On: 12/19/2019 16:24    Assessment & Plan:   Problem List Items Addressed This Visit      Unprioritized   CKD stage 3 due to type 2 diabetes mellitus ([place]) - Primary   Relevant Medications   atorvastatin (LIPITOR) 20 MG tablet   losartan (COZAAR) 50 MG tablet   Cough    Improved but not resolved. 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  She is no longer taking an ACE Inhibitor but  taking an ARB. Suggested continued treatment for rhinitis,  PND and GERD .  Given her recent treatment for bronchitis will Refill cheratussin for prn use,  Chest x ray ordered        Relevant Orders   DG Chest 2 View   Diabetic nephropathy associated with type 2 diabetes mellitus ([place])    Continue losartan.  Advised to resume glipizide at breakfast to improve glycemic control and continue metformin   Lab Results  Component Value Date   HGBA1C 7.5 (H) 09/15/2020   Lab Results  Component Value Date   MICROALBUR 2.1 (H) 06/16/2020   MICROALBUR 4.9 (H) 09/26/2019           Relevant Medications   atorvastatin (LIPITOR) 20 MG tablet   losartan (COZAAR) 50 MG tablet   Essential hypertension, benign    Not at goal on amlodipine 5 mg and losartan 25 mg ,  Increasing losartan to 50 mg daily .  Return in 2 weeks for BP check      Relevant Medications   atorvastatin (LIPITOR) 20 MG tablet   amLODipine (NORVASC) 5 MG tablet   losartan (COZAAR) 50 MG tablet   Hyperlipidemia associated with type 2 diabetes mellitus ([place])    LDL rose by 100 pts with suspension of atorvastatin and she  has resumed 20 mg dose with good results. LDL is now  100.  No changes today  Lab Results  Component Value Date   CHOL 179 09/15/2020   HDL 56.70 09/15/2020   LDLCALC 85 09/15/2020   LDLDIRECT 172.0 09/27/2017   TRIG 184.0 (H) 09/15/2020   CHOLHDL 3 09/15/2020         Relevant Medications   atorvastatin (LIPITOR) 20 MG tablet   losartan (COZAAR) 50 MG tablet   Type 2 diabetes mellitus with nephropathy (HCC)   Relevant Medications   atorvastatin (LIPITOR) 20 MG tablet   losartan (COZAAR) 50 MG tablet   Other Relevant Orders   Hemoglobin A1c   Comprehensive metabolic panel   Lipid panel   Microalbumin / creatinine urine ratio      I have changed Jeanette A. Chrissie Noa "Holley Ann"'s losartan. I am also having her start on Cheratussin AC. Additionally, I am having her maintain her glucose blood, multivitamin, Fish Oil-Flax Oil-Borage Oil, glipiZIDE, metFORMIN, B-12 Compliance Injection, cyanocobalamin, atorvastatin, and amLODipine.  Meds ordered this encounter  Medications  . atorvastatin (LIPITOR) 20 MG tablet    Sig: Take 1 tablet (20 mg total) by mouth daily.    Dispense:  90 tablet    Refill:  3  . amLODipine (NORVASC) 5 MG tablet    Sig: Take 1 tablet (5 mg total) by mouth daily.    Dispense:  90 tablet    Refill:  3  . losartan (COZAAR) 50 MG tablet    Sig: Take 1 tablet (50 mg total) by mouth daily.    Dispense:  90 tablet    Refill:  1    .note dose increase.   KEEP ON FILE FOR FUTURE REFILLS  . guaiFENesin-codeine (CHERATUSSIN AC) 100-10 MG/5ML syrup    Sig: Take 5 mLs by mouth 3 (three) times daily as needed for cough.    Dispense:  120 mL    Refill:  0    Medications Discontinued During This Encounter  Medication Reason  . atorvastatin (LIPITOR) 20 MG tablet Reorder  . amLODipine (NORVASC) 5 MG tablet Reorder  . losartan (COZAAR) 25 MG tablet     Follow-up: Return in about 3 months (around 02/01/2021) for follow up diabetes.   Aris Everts  Derrel Nip, MD

## 2020-11-07 NOTE — Assessment & Plan Note (Signed)
Not at goal on amlodipine 5 mg and losartan 25 mg ,  Increasing losartan to 50 mg daily .  Return in 2 weeks for BP check

## 2020-11-07 NOTE — Assessment & Plan Note (Signed)
LDL rose by 100 pts with suspension of atorvastatin and she has resumed 20 mg dose with good results. LDL is now  100.  No changes today  Lab Results  Component Value Date   CHOL 179 09/15/2020   HDL 56.70 09/15/2020   LDLCALC 85 09/15/2020   LDLDIRECT 172.0 09/27/2017   TRIG 184.0 (H) 09/15/2020   CHOLHDL 3 09/15/2020

## 2020-11-07 NOTE — Assessment & Plan Note (Signed)
Continue losartan.  Advised to resume glipizide at breakfast to improve glycemic control and continue metformin   Lab Results  Component Value Date   HGBA1C 7.5 (H) 09/15/2020   Lab Results  Component Value Date   MICROALBUR 2.1 (H) 06/16/2020   MICROALBUR 4.9 (H) 09/26/2019

## 2020-11-07 NOTE — Assessment & Plan Note (Addendum)
Improved but not resolved. 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  She is no longer taking an ACE Inhibitor but  taking an ARB. Suggested continued treatment for rhinitis,  PND and GERD .  Given her recent treatment for bronchitis will Refill cheratussin for prn use,  Chest x ray ordered

## 2020-11-11 ENCOUNTER — Telehealth: Payer: Self-pay | Admitting: Internal Medicine

## 2020-11-11 NOTE — Telephone Encounter (Signed)
Placed requisition in quick sign folder.

## 2020-11-11 NOTE — Telephone Encounter (Signed)
Patient tried to make an appointment for her Bone Density test, [facility] says she needs a referral for that. Please send referral.

## 2020-12-01 LAB — HM MAMMOGRAPHY

## 2020-12-01 LAB — HM DEXA SCAN: HM Dexa Scan: NORMAL

## 2021-01-01 ENCOUNTER — Telehealth: Payer: Self-pay

## 2021-01-01 DIAGNOSIS — Z13820 Encounter for screening for osteoporosis: Secondary | ICD-10-CM

## 2021-01-01 NOTE — Telephone Encounter (Signed)
Pt would like to get her results of her bone density and mammogram that was done at [place]. Results are in care everywhere.

## 2021-01-01 NOTE — Telephone Encounter (Signed)
LMTCB

## 2021-01-01 NOTE — Telephone Encounter (Signed)
Pt called in to find out the results of her bone density scan. She states that she did it the same day as her mammogram. Please call back

## 2021-01-02 DIAGNOSIS — Z13820 Encounter for screening for osteoporosis: Secondary | ICD-10-CM | POA: Insufficient documentation

## 2021-01-02 NOTE — Telephone Encounter (Signed)
See unrouted message re DEXA

## 2021-01-02 NOTE — Assessment & Plan Note (Signed)
Normal T scores [facility] March 2022

## 2021-01-02 NOTE — Telephone Encounter (Signed)
DEXA scan was normal.  No osteoporosis.  Continue 1200 mg calcium daily through diet and supplements,.  1000 IUS Vit D3 daily and regular weight bearing exercise.   Health maintenance updated

## 2021-01-04 NOTE — Telephone Encounter (Signed)
Spoke with pt and informed her of her Dexa scan results. Pt gave a verbal understanding.

## 2021-01-21 ENCOUNTER — Telehealth: Payer: Self-pay | Admitting: Internal Medicine

## 2021-01-21 ENCOUNTER — Other Ambulatory Visit: Payer: Self-pay

## 2021-01-21 DIAGNOSIS — E119 Type 2 diabetes mellitus without complications: Secondary | ICD-10-CM

## 2021-01-21 MED ORDER — METFORMIN HCL 500 MG PO TABS: 500.0000 mg | ORAL_TABLET | Freq: Two times a day (BID) | ORAL | 1 refills | Status: DC

## 2021-01-21 NOTE — Telephone Encounter (Signed)
Pt needs a refill on metFORMIN (GLUCOPHAGE) 500 MG tablet sent to [facility]

## 2021-01-25 ENCOUNTER — Other Ambulatory Visit: Payer: Self-pay | Admitting: Internal Medicine

## 2021-02-01 ENCOUNTER — Other Ambulatory Visit: Payer: Self-pay

## 2021-02-01 ENCOUNTER — Other Ambulatory Visit (INDEPENDENT_AMBULATORY_CARE_PROVIDER_SITE_OTHER): Payer: Medicare (Managed Care)

## 2021-02-01 DIAGNOSIS — E1121 Type 2 diabetes mellitus with diabetic nephropathy: Secondary | ICD-10-CM

## 2021-02-01 LAB — COMPREHENSIVE METABOLIC PANEL
ALT: 11 U/L (ref 0–35)
AST: 11 U/L (ref 0–37)
Albumin: 4.2 g/dL (ref 3.5–5.2)
Alkaline Phosphatase: 74 U/L (ref 39–117)
BUN: 25 mg/dL — ABNORMAL HIGH (ref 6–23)
CO2: 29 mEq/L (ref 19–32)
Calcium: 9.3 mg/dL (ref 8.4–10.5)
Chloride: 105 mEq/L (ref 96–112)
Creatinine, Ser: 1.33 mg/dL — ABNORMAL HIGH (ref 0.40–1.20)
GFR: 40.33 mL/min — ABNORMAL LOW (ref >60.00)
Glucose, Bld: 154 mg/dL — ABNORMAL HIGH (ref 70–99)
Potassium: 4.7 mEq/L (ref 3.5–5.1)
Sodium: 140 mEq/L (ref 135–145)
Total Bilirubin: 0.2 mg/dL (ref 0.2–1.2)
Total Protein: 7.1 g/dL (ref 6.0–8.3)

## 2021-02-01 LAB — LIPID PANEL
Cholesterol: 172 mg/dL (ref 0–200)
HDL: 57.6 mg/dL (ref >39.00)
LDL Cholesterol: 91 mg/dL (ref 0–99)
NonHDL: 114.77
Total CHOL/HDL Ratio: 3
Triglycerides: 117 mg/dL (ref 0.0–149.0)
VLDL: 23.4 mg/dL (ref 0.0–40.0)

## 2021-02-01 LAB — MICROALBUMIN / CREATININE URINE RATIO
Creatinine,U: 67.9 mg/dL
Microalb Creat Ratio: 11.6 mg/g (ref 0.0–30.0)
Microalb, Ur: 7.9 mg/dL — ABNORMAL HIGH (ref 0.0–1.9)

## 2021-02-01 LAB — HEMOGLOBIN A1C: Hgb A1c MFr Bld: 8.2 % — ABNORMAL HIGH (ref 4.6–6.5)

## 2021-02-03 ENCOUNTER — Other Ambulatory Visit: Payer: Self-pay

## 2021-02-03 ENCOUNTER — Encounter: Payer: Self-pay | Admitting: Internal Medicine

## 2021-02-03 ENCOUNTER — Telehealth: Payer: Self-pay

## 2021-02-03 ENCOUNTER — Ambulatory Visit (INDEPENDENT_AMBULATORY_CARE_PROVIDER_SITE_OTHER): Payer: Medicare (Managed Care) | Admitting: Internal Medicine

## 2021-02-03 VITALS — BP 148/78 | HR 88 | Temp 97.3°F | Resp 15 | Ht 59.25 in | Wt 194.6 lb

## 2021-02-03 DIAGNOSIS — E1121 Type 2 diabetes mellitus with diabetic nephropathy: Secondary | ICD-10-CM | POA: Diagnosis not present

## 2021-02-03 DIAGNOSIS — N183 Chronic kidney disease, stage 3 unspecified: Secondary | ICD-10-CM

## 2021-02-03 DIAGNOSIS — R3989 Other symptoms and signs involving the genitourinary system: Secondary | ICD-10-CM

## 2021-02-03 DIAGNOSIS — E1122 Type 2 diabetes mellitus with diabetic chronic kidney disease: Secondary | ICD-10-CM | POA: Diagnosis not present

## 2021-02-03 DIAGNOSIS — E785 Hyperlipidemia, unspecified: Secondary | ICD-10-CM

## 2021-02-03 DIAGNOSIS — H35039 Hypertensive retinopathy, unspecified eye: Secondary | ICD-10-CM

## 2021-02-03 DIAGNOSIS — I10 Essential (primary) hypertension: Secondary | ICD-10-CM

## 2021-02-03 DIAGNOSIS — E1169 Type 2 diabetes mellitus with other specified complication: Secondary | ICD-10-CM

## 2021-02-03 LAB — URINALYSIS, ROUTINE W REFLEX MICROSCOPIC
Bilirubin Urine: NEGATIVE
Ketones, ur: NEGATIVE
Nitrite: NEGATIVE
Specific Gravity, Urine: 1.02 (ref 1.000–1.030)
Total Protein, Urine: NEGATIVE
Urine Glucose: NEGATIVE
Urobilinogen, UA: 0.2 (ref 0.0–1.0)
pH: 5.5 (ref 5.0–8.0)

## 2021-02-03 LAB — UNLABELED: Test Ordered On Req: 395

## 2021-02-03 MED ORDER — LOSARTAN POTASSIUM 50 MG PO TABS: 50.0000 mg | ORAL_TABLET | Freq: Every day | ORAL | 1 refills | Status: AC

## 2021-02-03 NOTE — Chronic Care Management (AMB) (Signed)
  Chronic Care Management   Note  02/03/2021 Name: Jeanette Spencer MRN: [id number] DOB: May 10, 1951  Jeanette Spencer is a 70 y.o. year old female who is a primary care patient of Derrel Nip, Aris Everts, MD. I reached out to [name] by phone today in response to a referral sent by Jeanette Spencer's PCP, [name], MD     Jeanette Spencer was given information about Chronic Care Management services today including:  1. CCM service includes personalized support from designated clinical staff supervised by her physician, including individualized plan of care and coordination with other care providers 2. 24/7 contact phone numbers for assistance for urgent and routine care needs. 3. Service will only be billed when office clinical staff spend 20 minutes or more in a month to coordinate care. 4. Only one practitioner may furnish and bill the service in a calendar month. 5. The patient may stop CCM services at any time (effective at the end of the month) by phone call to the office staff. 6. The patient will be responsible for cost sharing (co-pay) of up to 20% of the service fee (after annual deductible is met).  Patient agreed to services and verbal consent obtained.   Follow up plan: Telephone appointment with care management team member scheduled for:02/11/2021  Noreene Larsson, [address], [place], [place] [address] Direct Dial: [phone] [email or web address] Website: [email or web address]

## 2021-02-03 NOTE — Progress Notes (Signed)
Subjective:  Patient ID: [name], female    DOB: 09/19/1951  Age: 70 y.o. MRN: [id number]  CC: The primary encounter diagnosis was Type 2 diabetes mellitus with nephropathy ([place]). Diagnoses of CKD stage 3 due to type 2 diabetes mellitus ([place]), Essential hypertension, benign, Bladder pain, Hypertensive retinopathy, unspecified laterality, and Hyperlipidemia associated with type 2 diabetes mellitus ([place]) were also pertinent to this visit.  HPI Shamarie A Lips presents for follow up on type 2 DM complicated by CKD with nephropathy, obesity, hypertension and hyperlipidemia .  DM:  Stopped glipizide 5 mg at lunchtime  due to urinary pain and pressure acc'd by end of void urethral  pain which has been present for 3 weeks.the pain was excruciating but she did not go to the ER or request a urinalysis  Loss of control noted with rise in a1c from 7.5 to 8.2  . Now Currently taking metformin only .  Diet reviewed:  Has been making her own smoothies and adding a lot of frozen fruit.  Notes fasting sugars of 170 .  Admits to nibbling on junkfood  until 2 am   Had an eye exam by Dr Chong Sicilian  In April ;  Apparently hypertensive retinopathy was noted on the right .     HTN:  Elevated today  Regimen is 5 mg amlodipine 50 mg losartan .  Home readings on a new machine have been elevated.      Lab Results  Component Value Date   HGBA1C 8.2 (H) 02/01/2021     Outpatient Medications Prior to Visit  Medication Sig Dispense Refill  . amLODipine (NORVASC) 5 MG tablet Take 1 tablet (5 mg total) by mouth daily. 90 tablet 3  . atorvastatin (LIPITOR) 20 MG tablet Take 1 tablet by mouth once daily 90 tablet 3  . Cyanocobalamin (B-12 COMPLIANCE INJECTION) 1000 MCG/ML KIT Inject 1,000 mcg as directed every 30 (thirty) days. 1 kit 5  . glucose blood test strip Use as instructed to check sugars twice daily.  Uncontrolled diabetes mellitus E11.65 100 each 12  . metFORMIN (GLUCOPHAGE) 500 MG tablet Take 1 tablet  (500 mg total) by mouth 2 (two) times daily with a meal. 180 tablet 1  . losartan (COZAAR) 50 MG tablet Take 1 tablet (50 mg total) by mouth daily. 90 tablet 1  . glipiZIDE (GLUCOTROL) 5 MG tablet Take 5 mg by mouth as needed. (Patient not taking: Reported on 02/03/2021)    . cyanocobalamin (,VITAMIN B-12,) 1000 MCG/ML injection SMARTSIG:1 Milliliter(s) IM (Patient not taking: Reported on 02/03/2021)    . Flax Oil-Fish Oil-Borage Oil (FISH OIL-FLAX OIL-BORAGE OIL) CAPS Take 1 capsule by mouth daily. (Patient not taking: Reported on 02/03/2021)    . guaiFENesin-codeine (CHERATUSSIN AC) 100-10 MG/5ML syrup Take 5 mLs by mouth 3 (three) times daily as needed for cough. (Patient not taking: Reported on 02/03/2021) 120 mL 0  . Multiple Vitamin (MULTIVITAMIN) tablet Take 1 tablet by mouth daily. (Patient not taking: Reported on 02/03/2021)     No facility-administered medications prior to visit.    Review of Systems;  Patient denies headache, fevers, malaise, unintentional weight loss, skin rash, eye pain, sinus congestion and sinus pain, sore throat, dysphagia,  hemoptysis , cough, dyspnea, wheezing, chest pain, palpitations, orthopnea, edema, abdominal pain, nausea, melena, diarrhea, constipation, flank pain, dysuria, hematuria, urinary  Frequency, nocturia, numbness, tingling, seizures,  Focal weakness, Loss of consciousness,  Tremor, insomnia, depression, anxiety, and suicidal ideation.      Objective:  BP (!) 148/78 (BP Location: Left Arm, Patient Position: Sitting, Cuff Size: Large)   Pulse 88   Temp (!) 97.3 F (36.3 C) (Temporal)   Resp 15   Ht 4' 11.25" (1.505 m)   Wt 194 lb 9.6 oz (88.3 kg)   SpO2 98%   BMI 38.97 kg/m   BP Readings from Last 3 Encounters:  02/03/21 (!) 148/78  11/04/20 (!) 152/74  10/19/20 (!) 153/80    Wt Readings from Last 3 Encounters:  02/03/21 194 lb 9.6 oz (88.3 kg)  11/04/20 189 lb 9.6 oz (86 kg)  10/19/20 190 lb 5.9 oz (86.4 kg)    General appearance:  alert, cooperative and appears stated age Ears: normal TM's and external ear canals both ears Throat: lips, mucosa, and tongue normal; teeth and gums normal Neck: no adenopathy, no carotid bruit, supple, symmetrical, trachea midline and thyroid not enlarged, symmetric, no tenderness/mass/nodules Back: symmetric, no curvature. ROM normal. No CVA tenderness. Lungs: clear to auscultation bilaterally Heart: regular rate and rhythm, S1, S2 normal, no murmur, click, rub or gallop Abdomen: soft, non-tender; bowel sounds normal; no masses,  no organomegaly Pulses: 2+ and symmetric Skin: Skin color, texture, turgor normal. No rashes or lesions Lymph nodes: Cervical, supraclavicular, and axillary nodes normal.  Lab Results  Component Value Date   HGBA1C 8.2 (H) 02/01/2021   HGBA1C 7.5 (H) 09/15/2020   HGBA1C 7.6 (H) 06/16/2020    Lab Results  Component Value Date   CREATININE 1.33 (H) 02/01/2021   CREATININE 1.25 (H) 09/15/2020   CREATININE 1.32 (H) 06/16/2020    Lab Results  Component Value Date   WBC 9.6 10/19/2020   HGB 11.9 (L) 10/19/2020   HCT 37.9 10/19/2020   PLT 286 10/19/2020   GLUCOSE 154 (H) 02/01/2021   CHOL 172 02/01/2021   TRIG 117.0 02/01/2021   HDL 57.60 02/01/2021   LDLDIRECT 172.0 09/27/2017   LDLCALC 91 02/01/2021   ALT 11 02/01/2021   AST 11 02/01/2021   NA 140 02/01/2021   K 4.7 02/01/2021   CL 105 02/01/2021   CREATININE 1.33 (H) 02/01/2021   BUN 25 (H) 02/01/2021   CO2 29 02/01/2021   TSH 2.49 06/16/2020   HGBA1C 8.2 (H) 02/01/2021   MICROALBUR 7.9 (H) 02/01/2021    US RENAL  Result Date: 12/19/2019 CLINICAL DATA:  Chronic renal disease EXAM: RENAL / URINARY TRACT ULTRASOUND COMPLETE COMPARISON:  None. FINDINGS: Right Kidney: Renal measurements: 12.3 x 4.7 x 4.6 cm. = volume: 139 mL . Echogenicity within normal limits. No mass or hydronephrosis visualized. Left Kidney: Renal measurements: 11.6 x 6.1 x 4.5 cm = volume: 162 mL. No mass lesion or  hydronephrosis is noted. Seventeen calcification is noted in the lower pole without obstructive change. Bladder: Appears normal for degree of bladder distention. Other: None. IMPRESSION: Nonobstructing left renal stone. No other focal abnormality is noted. Electronically Signed   By: Inez Catalina M.D.   On: 12/19/2019 16:24    Assessment & Plan:   Problem List Items Addressed This Visit      Unprioritized   CKD stage 3 due to type 2 diabetes mellitus ([place])    Significant loss of control noted due to dietary indiscretions and suboptimal medical regimen. She is taking only metformin; stopped glipizide for unclear reasons.  Would benefit from [place] and/or Ozempic for cardioprotective benefits and glycemic control rather than resuming sulfonylurea. Advised patient that I am referring to CCM for medication assistance.  GFR remains < 50 .  She was referred  to nephrology  in February and from there  was referred to Hematology for abnormal SPEP.   Lab Results  Component Value Date   HGBA1C 8.2 (H) 02/01/2021     Lab Results  Component Value Date   CREATININE 1.33 (H) 02/01/2021   Lab Results  Component Value Date   NA 140 02/01/2021   K 4.7 02/01/2021   CL 105 02/01/2021   CO2 29 02/01/2021         Relevant Medications   losartan (COZAAR) 50 MG tablet   Essential hypertension, benign    Not at goal.  Has only been taking 25 mg losartan and 5 mg amlodipine.  Increase losartan to 50 mg daily   Lab Results  Component Value Date   CREATININE 1.33 (H) 02/01/2021   Lab Results  Component Value Date   NA 140 02/01/2021   K 4.7 02/01/2021   CL 105 02/01/2021   CO2 29 02/01/2021         Relevant Medications   losartan (COZAAR) 50 MG tablet   Hyperlipidemia associated with type 2 diabetes mellitus ([place])    LDL rose by 100 pts with suspension of atorvastatin and she has resumed 20 mg dose with good results. LDL is now  100.  No changes today   Lab Results  Component Value  Date   CHOL 172 02/01/2021   HDL 57.60 02/01/2021   LDLCALC 91 02/01/2021   LDLDIRECT 172.0 09/27/2017   TRIG 117.0 02/01/2021   CHOLHDL 3 02/01/2021         Relevant Medications   losartan (COZAAR) 50 MG tablet   Hypertensive retinopathy    Per patient.  Addressed uncontrolled hypertension today       Type 2 diabetes mellitus with nephropathy (HCC) - Primary   Relevant Medications   losartan (COZAAR) 50 MG tablet   Other Relevant Orders   AMB Referral to [place]    Other Visit Diagnoses    Bladder pain       Relevant Orders   Urinalysis, Routine w reflex microscopic (Completed)   Urine Culture (Completed)      I provided  30 minutes  during this encounter reviewing patient's current medications and blood sugars , labs and imaging studies, providing counseling on the above mentioned problems I n a face to face visit  , and coordination  of care .   I have discontinued Kathalene A. Decker "Emoree Ann"'s multivitamin, Fish Oil-Flax Oil-Borage Oil, and Cheratussin AC. I am also having her maintain her glucose blood, glipiZIDE, B-12 Compliance Injection, amLODipine, metFORMIN, atorvastatin, and losartan.  Meds ordered this encounter  Medications  . losartan (COZAAR) 50 MG tablet    Sig: Take 1 tablet (50 mg total) by mouth daily.    Dispense:  90 tablet    Refill:  1    NOTE DOSE INCREASE   PLEASE FILL NOW    Medications Discontinued During This Encounter  Medication Reason  . cyanocobalamin (,VITAMIN B-12,) 1000 MCG/ML injection Duplicate  . Flax Oil-Fish Oil-Borage Oil (FISH OIL-FLAX OIL-BORAGE OIL) CAPS   . guaiFENesin-codeine (CHERATUSSIN AC) 100-10 MG/5ML syrup   . Multiple Vitamin (MULTIVITAMIN) tablet   . losartan (COZAAR) 50 MG tablet Reorder    Follow-up: Return in about 3 months (around 05/06/2021) for follow up diabetes.   [name], MD

## 2021-02-03 NOTE — Patient Instructions (Addendum)
Your blood pressure is too high.  You need to increase the losartan is 50 mg daily and continue the amlodipine at 5 mg daily .   Here are several low carb protein shakes that are all similar in nutrition and lower in sugar than the ones you make at home :  Premier Protein Muscle Milk Atkins EAS  [employer] brand of protein shake  Your bladder symptoms might be from infection or from a kidney stone.  Drinks lots of water;  We will figure out what is going on once I see the results of your urinalysis and culture   Your diabetes is not in control anymore.  I am letting you know that I am referring to our clinical pharmacist ,  [name].  Catie helps me provide additional services to my patients who are on Medicare and dealing with  chronic diseases , like diabetes.  I do not expect this referral to cost you anything out of pocket, but I do think Catie will be able to help you get your diabetes under control and maximize your drug benefits.  She will make contact with  You by phone in the next week

## 2021-02-03 NOTE — Assessment & Plan Note (Addendum)
Significant loss of control noted due to dietary indiscretions and suboptimal medical regimen. She is taking only metformin; stopped glipizide for unclear reasons.  Would benefit from [place] and/or Ozempic for cardioprotective benefits and glycemic control rather than resuming sulfonylurea. Advised patient that I am referring to CCM for medication assistance.  GFR remains < 50 .  She was referred  to nephrology  in February and from there  was referred to Hematology for abnormal SPEP.   Lab Results  Component Value Date   HGBA1C 8.2 (H) 02/01/2021     Lab Results  Component Value Date   CREATININE 1.33 (H) 02/01/2021   Lab Results  Component Value Date   NA 140 02/01/2021   K 4.7 02/01/2021   CL 105 02/01/2021   CO2 29 02/01/2021

## 2021-02-05 ENCOUNTER — Telehealth: Payer: Self-pay | Admitting: Internal Medicine

## 2021-02-05 LAB — URINE CULTURE
MICRO NUMBER:: 11887951
SPECIMEN QUALITY:: ADEQUATE

## 2021-02-05 LAB — PAT ID TIQ DOC: Test Affected: 395

## 2021-02-05 NOTE — Telephone Encounter (Signed)
Advised patient wear waiting for the culture to comeback, the patient stated The symptoms she was having at her visit are better but stil present when she at the end of urination wit just a little stinging pain.

## 2021-02-05 NOTE — Telephone Encounter (Signed)
Patient called in she stated that she looked at her results in mychart wanted to know if this means she has a UTI

## 2021-02-06 DIAGNOSIS — H35039 Hypertensive retinopathy, unspecified eye: Secondary | ICD-10-CM | POA: Insufficient documentation

## 2021-02-06 NOTE — Assessment & Plan Note (Signed)
Not at goal.  Has only been taking 25 mg losartan and 5 mg amlodipine.  Increase losartan to 50 mg daily   Lab Results  Component Value Date   CREATININE 1.33 (H) 02/01/2021   Lab Results  Component Value Date   NA 140 02/01/2021   K 4.7 02/01/2021   CL 105 02/01/2021   CO2 29 02/01/2021

## 2021-02-06 NOTE — Assessment & Plan Note (Signed)
LDL rose by 100 pts with suspension of atorvastatin and she has resumed 20 mg dose with good results. LDL is now  100.  No changes today   Lab Results  Component Value Date   CHOL 172 02/01/2021   HDL 57.60 02/01/2021   LDLCALC 91 02/01/2021   LDLDIRECT 172.0 09/27/2017   TRIG 117.0 02/01/2021   CHOLHDL 3 02/01/2021

## 2021-02-06 NOTE — Assessment & Plan Note (Signed)
Per patient.  Addressed uncontrolled hypertension today

## 2021-02-11 ENCOUNTER — Ambulatory Visit (INDEPENDENT_AMBULATORY_CARE_PROVIDER_SITE_OTHER): Payer: Medicare (Managed Care) | Admitting: Pharmacist

## 2021-02-11 DIAGNOSIS — I10 Essential (primary) hypertension: Secondary | ICD-10-CM

## 2021-02-11 DIAGNOSIS — E785 Hyperlipidemia, unspecified: Secondary | ICD-10-CM

## 2021-02-11 DIAGNOSIS — N183 Chronic kidney disease, stage 3 unspecified: Secondary | ICD-10-CM

## 2021-02-11 DIAGNOSIS — E1121 Type 2 diabetes mellitus with diabetic nephropathy: Secondary | ICD-10-CM | POA: Diagnosis not present

## 2021-02-11 DIAGNOSIS — E1169 Type 2 diabetes mellitus with other specified complication: Secondary | ICD-10-CM

## 2021-02-11 DIAGNOSIS — E1122 Type 2 diabetes mellitus with diabetic chronic kidney disease: Secondary | ICD-10-CM

## 2021-02-11 NOTE — Chronic Care Management (AMB) (Addendum)
Chronic Care Management Pharmacy Note  02/11/2021 Name:  Jeanette Spencer MRN:  [id number] DOB:  03/11/1951  Subjective: Jeanette Spencer is an 70 y.o. year old female who is a primary patient of Derrel Nip, Aris Everts, MD.  The CCM team was consulted for assistance with disease management and care coordination needs.    Engaged with patient by telephone for initial visit in response to provider referral for pharmacy case management and/or care coordination services.   Consent to Services:  The patient was given the following information about Chronic Care Management services today, agreed to services, and gave verbal consent: 1. CCM service includes personalized support from designated clinical staff supervised by the primary care provider, including individualized plan of care and coordination with other care providers 2. 24/7 contact phone numbers for assistance for urgent and routine care needs. 3. Service will only be billed when office clinical staff spend 20 minutes or more in a month to coordinate care. 4. Only one practitioner may furnish and bill the service in a calendar month. 5.The patient may stop CCM services at any time (effective at the end of the month) by phone call to the office staff. 6. The patient will be responsible for cost sharing (co-pay) of up to 20% of the service fee (after annual deductible is met). Patient agreed to services and consent obtained.  Patient Care Team: [name], MD as PCP - General (Internal Medicine) Lequita Asal, MD as Referring Physician (Hematology and Oncology) Anthonette Legato, MD (Nephrology) De Hollingshead, RPH-CPP (Pharmacist)  Recent office visits:  5/11- PCP, A1c elevated, continue atorvastatin, increase losartan  Recent consult visits:  3/10 - nephrology Dr. Holley Raring, needed to pick up increased losartan dose, Hgb stable  3/8 - mammogram normal; DEXA normal (t score 1.1)  Hospital visits: None in previous 6  months  Objective:  Lab Results  Component Value Date   CREATININE 1.33 (H) 02/01/2021   CREATININE 1.25 (H) 09/15/2020   CREATININE 1.32 (H) 06/16/2020    Lab Results  Component Value Date   HGBA1C 8.2 (H) 02/01/2021   Last diabetic Eye exam:  Lab Results  Component Value Date/Time   HMDIABEYEEXA No Retinopathy 11/26/2017 12:00 AM    Last diabetic Foot exam:  Lab Results  Component Value Date/Time   HMDIABFOOTEX normal 12/22/2014 12:00 AM        Component Value Date/Time   CHOL 172 02/01/2021 0816   TRIG 117.0 02/01/2021 0816   HDL 57.60 02/01/2021 0816   CHOLHDL 3 02/01/2021 0816   VLDL 23.4 02/01/2021 0816   LDLCALC 91 02/01/2021 0816   LDLDIRECT 172.0 09/27/2017 1624    Hepatic Function Latest Ref Rng & Units 02/01/2021 09/15/2020 06/16/2020  Total Protein 6.0 - 8.3 g/dL 7.1 7.4 6.9  Albumin 3.5 - 5.2 g/dL 4.2 4.3 4.2  AST 0 - 37 U/L 11 13 13   ALT 0 - 35 U/L 11 13 12   Alk Phosphatase 39 - 117 U/L 74 67 70  Total Bilirubin 0.2 - 1.2 mg/dL 0.2 0.4 0.3    Lab Results  Component Value Date/Time   TSH 2.49 06/16/2020 07:59 AM   TSH 1.37 09/27/2017 04:24 PM   FREET4 0.79 09/27/2017 04:24 PM    CBC Latest Ref Rng & Units 10/19/2020 07/20/2020 04/20/2020  WBC 4.0 - 10.5 K/uL 9.6 10.1 10.3  Hemoglobin 12.0 - 15.0 g/dL 11.9(L) 11.3(L) 11.1(L)  Hematocrit 36.0 - 46.0 % 37.9 36.3 34.9(L)  Platelets 150 - 400 K/uL  286 276 276    Lab Results  Component Value Date/Time   VD25OH 24 (L) 04/11/2013 10:49 AM    Clinical ASCVD: No  The 10-year ASCVD risk score Mikey Bussing DC Jr., et al., 2013) is: 30.9%   Values used to calculate the score:     Age: 62 years     Sex: Female     Is Non-Hispanic African American: Yes     Diabetic: Yes     Tobacco smoker: No     Systolic Blood Pressure: 220 mmHg     Is BP treated: Yes     HDL Cholesterol: 57.6 mg/dL     Total Cholesterol: 172 mg/dL      Social History   Tobacco Use  Smoking Status Never Smoker  Smokeless Tobacco  Never Used   BP Readings from Last 3 Encounters:  02/03/21 (!) 148/78  11/04/20 (!) 152/74  10/19/20 (!) 153/80   Pulse Readings from Last 3 Encounters:  02/03/21 88  11/04/20 92  10/19/20 78   Wt Readings from Last 3 Encounters:  02/03/21 194 lb 9.6 oz (88.3 kg)  11/04/20 189 lb 9.6 oz (86 kg)  10/19/20 190 lb 5.9 oz (86.4 kg)    Assessment: Review of patient past medical history, allergies, medications, health status, including review of consultants reports, laboratory and other test data, was performed as part of comprehensive evaluation and provision of chronic care management services.   SDOH:  (Social Determinants of Health) assessments and interventions performed:  SDOH Interventions   Flowsheet Row Most Recent Value  SDOH Interventions   Financial Strain Interventions Intervention Not Indicated      CCM Care Plan  Allergies  Allergen Reactions  . Oxycodone   . Glipizide     Recurrent hypoglycemia  . Lipitor [Atorvastatin]     Affected balance  . Penicillin G     Other reaction(s): Unknown    Medications Reviewed Today    Reviewed by De Hollingshead, RPH-CPP (Pharmacist) on 02/11/21 at 1250  Med List Status: <None>  Medication Order Taking? Sig Documenting Provider Last Dose Status Informant  amLODipine (NORVASC) 5 MG tablet [id number] Yes Take 1 tablet (5 mg total) by mouth daily. [name], MD Taking Active   atorvastatin (LIPITOR) 20 MG tablet [id number] Yes Take 1 tablet by mouth once daily [name], MD Taking Active   Cyanocobalamin (B-12 COMPLIANCE INJECTION) 1000 MCG/ML KIT [id number] Yes Inject 1,000 mcg as directed every 30 (thirty) days. Lequita Asal, MD Taking Active   ELDERBERRY PO [id number] Yes Take 1 tablet by mouth daily. [provider] Taking Active   glipiZIDE (GLUCOTROL) 5 MG tablet [id number] Yes Take 5 mg by mouth as needed. [provider] Taking Active   glucose blood test strip [id number] Yes Use as  instructed to check sugars twice daily.  Uncontrolled diabetes mellitus E11.65 [name], MD Taking Active   losartan (COZAAR) 50 MG tablet [id number] Yes Take 1 tablet (50 mg total) by mouth daily. [name], MD Taking Active   metFORMIN (GLUCOPHAGE) 500 MG tablet [id number] Yes Take 1 tablet (500 mg total) by mouth 2 (two) times daily with a meal. [name], MD Taking Active           Patient Active Problem List   Diagnosis Date Noted  . Hypertensive retinopathy 02/06/2021  . Screening for osteoporosis 01/02/2021  . Iron deficiency anemia 04/26/2020  . Normocytic anemia 01/06/2020  . Abnormal SPEP 01/06/2020  .  CKD stage 3 due to type 2 diabetes mellitus ([place]) 09/02/2019  . Educated about COVID-19 virus infection 02/09/2019  . Cough 02/09/2019  . Positive FIT (fecal immunochemical test)   . Polyp of sigmoid colon   . Diabetic nephropathy associated with type 2 diabetes mellitus ([place]) 09/28/2017  . Alopecia of scalp 09/28/2017  . Breast cancer screening 09/28/2017  . Morbid obesity ([place]) 12/23/2014  . Statin intolerance 05/23/2013  . B12 deficiency 05/23/2013  . Hyperlipidemia associated with type 2 diabetes mellitus ([place]) 04/13/2013  . Essential hypertension, benign 04/13/2013  . Type 2 diabetes mellitus with nephropathy ([place])     Immunization History  Administered Date(s) Administered  . Influenza Whole 07/23/2012  . Influenza-Unspecified 07/11/2013, 07/10/2014, 07/11/2017, 07/11/2018, 07/16/2019, 06/26/2020  . Moderna Sars-Covid-2 Vaccination 11/20/2019, 12/25/2019, 01/22/2020  . Pneumococcal Conjugate-13 07/12/2013  . Pneumococcal Polysaccharide-23 09/27/2017  . Tdap 05/23/2005, 07/12/2013    Conditions to be addressed/monitored: HTN, HLD and DMII  Care Plan : Medication Management  Updates made by De Hollingshead, RPH-CPP since 02/11/2021 12:00 AM    Problem: CKD, DM, HTN, HLD     Long-Range Goal: Disease Progression Prevention   Start Date:  02/11/2021  This Visit's Progress: On track  Priority: High  Note:   Current Barriers:  . Unable to achieve control of diabetes   Pharmacist Clinical Goal(s):  [name] Over the next 90 days, patient will achieve control of diabetes as evidenced by A1c  through collaboration with PharmD and provider.   Interventions: . 1:1 collaboration with [name], MD regarding development and update of comprehensive plan of care as evidenced by provider attestation and co-signature . Inter-disciplinary care team collaboration (see longitudinal plan of care) . Comprehensive medication review performed; medication list updated in electronic medical record  SDOH: . Variable bedtime, if goes to bed at midnight, she will wake up 2-3 am. Sometimes is out most of the day caring for great grandchild. Sometimes home doing chores. No regular meal times.   Diabetes: . Uncontrolled and opportunities for optimization; current treatment: metformin 500 mg BID, glipizide 5 mg with lunch (since last visit with Dr. Derrel Nip) o Hx higher metformin doses, currently limited by renal function . Current glucose readings: fasting glucose: 140-150s, post prandial afternoons: 130s . Denies hypoglycemic symptoms since restarting glipizide  . Current meal patterns: breakfast: egg sandwich if out running errands later, sometimes skipping if she gets busy with chores; lunch: if big breakfast, no lunch; dinner: usually a smaller meal; snacks: some overnight snacking, but choosing to eat vegetables now, cutting out potato chips, nabs; drinks: water, sugar free flavoring packets . Current exercise: plans to start walking, fishing . Recommend to start [place] and stop glipizide. Counseled on SGLT2, including mechanism of action, side effects, and benefits. Discussed potential side effects of dehydration, genitourinary infections. Encouraged adequate hydration and genital hygiene. Patient declines to add medication at this time, even after  extensive education regarding long term CKD and CV risk reduction. She would like to focus on increased physical activity and dietary changes. . Extensive review of risks of glipizide, including low blood sugars and weight gain. She verbalizes understanding, but still prefers to continue glipizide with metformin. . Continue to check blood sugars at home at least twice a day, fasting and post prandial. Document readings, have ready to review at our next appointment. Reviewed goal A1c, goal fasting, goal 2 hour post prandial changes.  . Mailing Healthy Meal Planning handout  Hypertension: . Uncontrolled at last office visit; current  treatment: losartan 50 mg QPM, amlodipine 5 mg QAM  . Current home readings: has not checked at home since losartan dose was increased . Denies hypertensive symptoms or hypotensive symptoms . Continue current regimen at this time. Advised to check BP at home to report at future readings . Patient concerned about kidneys. Ordered BMP ~ 7-10 days after dose increase of losartan. Scheduled for non-fasting lab.  Hyperlipidemia: . Controlled at goal <100; current treatment: atorvastatin 20 mg daily  . Recommend to continue current regimen at this time   Patient Goals/Self-Care Activities . Over the next 90 days, patient will:  - take medications as prescribed check glucose twice daily, document, and provide at future appointments target a minimum of 150 minutes of moderate intensity exercise weekly engage in dietary modifications by reducing carbohydrate portion sizes  Follow Up Plan: Telephone follow up appointment with care management team member scheduled for: ~ 6 weeks      Medication Assistance: None required.  Patient affirms current coverage meets needs.  Patient's preferred pharmacy is:  [facility] [address] [place] - [place] [address] Phone: [phone] Fax: [phone]  [facility], [place] [address] Phone: [phone] Fax: [phone]  [facility] [address], [place] - [address] [place] [address] Phone: [phone] Fax: [facility] [address] [place] - [place] [address] Phone: [phone] Fax: [phone]   Follow Up:  Patient agrees to Care Plan and Follow-up.  Plan: Telephone follow up appointment with care management team member scheduled for:  ~ 6 weeks  [name], PharmD, [place], [place] Clinical Pharmacist [employer] [facility] [employer] [phone]

## 2021-02-11 NOTE — Patient Instructions (Signed)
Jeanette Spencer,   It was great to talk to you today!  Dr. Derrel Nip and I agree that we would like to stop glipizide and start a medication called Farxiga.   This medication would be taken first thing in the morning, as it can cause more frequent urination with more sugar in the urine. It could worsen risk for dehydration or genital infections. You would focus on staying well hydrated and using good genital hygiene. You would stop the medication and call our office if you develop any symptoms of genital infections, such as burning, itching, or pain while urinating or itching with redness that could be a yeast infection.  [name] has been shown to reduce the risk of progression of kidney disease. This helps reduce the chance of diabetes damaging your kidneys and having to go on dialysis. This would be a beneficial choice for you. It also does NOT cause low blood sugars, so stopping glipizide and starting [name] would be a safer choice for you in the long run.   I agree with your plan to increase physical activity and watch carbohydrate portion sizes! See the included handout about planning healthy meals.   Our goal A1c is <7%. This corresponds with fasting sugars <130 and 2 hour after meal sugars <180. Please check your blood sugars twice daily, write down these readings, and have ready for [name] to review moving forward.   Call me with any questions or concerns!  [name], PharmD [phone]  Visit Information   PATIENT GOALS:  Goals Addressed              This Visit's Progress     Patient Stated   .  Medication Management (pt-stated)        Patient Goals/Self-Care Activities . Over the next 90 days, patient will:  - take medications as prescribed check glucose twice daily, document, and provide at future appointments target a minimum of 150 minutes of moderate intensity exercise weekly engage in dietary modifications by reducing carbohydrate portion sizes       Consent to CCM  Services: [name] was given information about Chronic Care Management services today including:  1. CCM service includes personalized support from designated clinical staff supervised by her physician, including individualized plan of care and coordination with other care providers 2. 24/7 contact phone numbers for assistance for urgent and routine care needs. 3. Service will only be billed when office clinical staff spend 20 minutes or more in a month to coordinate care. 4. Only one practitioner may furnish and bill the service in a calendar month. 5. The patient may stop CCM services at any time (effective at the end of the month) by phone call to the office staff. 6. The patient will be responsible for cost sharing (co-pay) of up to 20% of the service fee (after annual deductible is met).  Patient agreed to services and verbal consent obtained.   The patient verbalized understanding of instructions, educational materials, and care plan provided today and agreed to receive a mailed copy of patient instructions, educational materials, and care plan.   Plan: Telephone follow up appointment with care management team member scheduled for:  ~ 6 weeks  [name], PharmD, [place], CPP Clinical Pharmacist [place] [facility]: Patient Care Plan: Medication Management    Problem Identified: CKD, DM, HTN, HLD     Long-Range Goal: Disease Progression Prevention   Start Date: 02/11/2021  This Visit's Progress: On track  Priority: High  Note:   Current Barriers:  . Unable to achieve control of diabetes   Pharmacist Clinical Goal(s):  [name] Over the next 90 days, patient will achieve control of diabetes as evidenced by A1c  through collaboration with PharmD and provider.   Interventions: . 1:1 collaboration with [name], MD regarding development and update of comprehensive plan of care as evidenced by provider attestation and  co-signature . Inter-disciplinary care team collaboration (see longitudinal plan of care) . Comprehensive medication review performed; medication list updated in electronic medical record  SDOH: . Variable bedtime, if goes to bed at midnight, she will wake up 2-3 am. Sometimes is out most of the day caring for great grandchild. Sometimes home doing chores. No regular meal times.   Diabetes: . Uncontrolled and opportunities for optimization; current treatment: metformin 500 mg BID, glipizide 5 mg with lunch (since last visit with Dr. Derrel Nip) o Hx higher metformin doses, currently limited by renal function . Current glucose readings: fasting glucose: 140-150s, post prandial afternoons: 130s . Denies hypoglycemic symptoms since restarting glipizide  . Current meal patterns: breakfast: egg sandwich if out running errands later, sometimes skipping if she gets busy with chores; lunch: if big breakfast, no lunch; dinner: usually a smaller meal; snacks: some overnight snacking, but choosing to eat vegetables now, cutting out potato chips, nabs; drinks: water, sugar free flavoring packets . Current exercise: plans to start walking, fishing . Recommend to start [place] and stop glipizide. Counseled on SGLT2, including mechanism of action, side effects, and benefits. Discussed potential side effects of dehydration, genitourinary infections. Encouraged adequate hydration and genital hygiene. Patient declines to add medication at this time, even after extensive education regarding long term CKD and CV risk reduction. She would like to focus on increased physical activity and dietary changes. . Extensive review of risks of glipizide, including low blood sugars and weight gain. She verbalizes understanding, but still prefers to continue glipizide with metformin. . Continue to check blood sugars at home at least twice a day, fasting and post prandial. Document readings, have ready to review at our next appointment.  Reviewed goal A1c, goal fasting, goal 2 hour post prandial changes.  . Mailing Healthy Meal Planning handout  Hypertension: . Uncontrolled at last office visit; current treatment: losartan 50 mg QPM, amlodipine 5 mg QAM  . Current home readings: has not checked at home since losartan dose was increased . Denies hypertensive symptoms or hypotensive symptoms . Continue current regimen at this time. Advised to check BP at home to report at future readings.   Hyperlipidemia: . Controlled at goal <100; current treatment: atorvastatin 20 mg daily  . Recommend to continue current regimen at this time   Patient Goals/Self-Care Activities . Over the next 90 days, patient will:  - take medications as prescribed check glucose twice daily, document, and provide at future appointments target a minimum of 150 minutes of moderate intensity exercise weekly engage in dietary modifications by reducing carbohydrate portion sizes  Follow Up Plan: Telephone follow up appointment with care management team member scheduled for: ~ 6 weeks

## 2021-02-11 NOTE — Addendum Note (Signed)
Addended by: De Hollingshead on: 02/11/2021 02:41 PM   Modules accepted: Orders

## 2021-02-19 ENCOUNTER — Other Ambulatory Visit: Payer: Self-pay

## 2021-02-19 ENCOUNTER — Other Ambulatory Visit (INDEPENDENT_AMBULATORY_CARE_PROVIDER_SITE_OTHER): Payer: Medicare (Managed Care)

## 2021-02-19 DIAGNOSIS — N183 Chronic kidney disease, stage 3 unspecified: Secondary | ICD-10-CM

## 2021-02-19 DIAGNOSIS — E1121 Type 2 diabetes mellitus with diabetic nephropathy: Secondary | ICD-10-CM

## 2021-02-19 DIAGNOSIS — E1122 Type 2 diabetes mellitus with diabetic chronic kidney disease: Secondary | ICD-10-CM | POA: Diagnosis not present

## 2021-02-19 LAB — BASIC METABOLIC PANEL
BUN: 29 mg/dL — ABNORMAL HIGH (ref 6–23)
CO2: 28 mEq/L (ref 19–32)
Calcium: 10 mg/dL (ref 8.4–10.5)
Chloride: 104 mEq/L (ref 96–112)
Creatinine, Ser: 1.41 mg/dL — ABNORMAL HIGH (ref 0.40–1.20)
GFR: 37.59 mL/min — ABNORMAL LOW (ref >60.00)
Glucose, Bld: 165 mg/dL — ABNORMAL HIGH (ref 70–99)
Potassium: 4.5 mEq/L (ref 3.5–5.1)
Sodium: 140 mEq/L (ref 135–145)

## 2021-03-17 ENCOUNTER — Other Ambulatory Visit: Payer: Self-pay

## 2021-03-17 MED ORDER — B-12 COMPLIANCE INJECTION 1000 MCG/ML IJ KIT
1000.0000 ug | PACK | INTRAMUSCULAR | 5 refills | Status: DC
Start: 2021-03-17 — End: 2024-06-15

## 2021-03-25 ENCOUNTER — Ambulatory Visit (INDEPENDENT_AMBULATORY_CARE_PROVIDER_SITE_OTHER): Payer: Medicare (Managed Care) | Admitting: Pharmacist

## 2021-03-25 DIAGNOSIS — E1122 Type 2 diabetes mellitus with diabetic chronic kidney disease: Secondary | ICD-10-CM

## 2021-03-25 DIAGNOSIS — N183 Chronic kidney disease, stage 3 unspecified: Secondary | ICD-10-CM

## 2021-03-25 DIAGNOSIS — I10 Essential (primary) hypertension: Secondary | ICD-10-CM | POA: Diagnosis not present

## 2021-03-25 DIAGNOSIS — E785 Hyperlipidemia, unspecified: Secondary | ICD-10-CM | POA: Diagnosis not present

## 2021-03-25 DIAGNOSIS — E1121 Type 2 diabetes mellitus with diabetic nephropathy: Secondary | ICD-10-CM | POA: Diagnosis not present

## 2021-03-25 DIAGNOSIS — E1169 Type 2 diabetes mellitus with other specified complication: Secondary | ICD-10-CM

## 2021-03-25 NOTE — Patient Instructions (Signed)
Jeanette Spencer,   I am so sorry things have been stressful and busy lately.   We do recommend that you check blood sugar levels periodically. Our goal for an A1c of less than 7% (which is one of the best ways we can protect your kidneys from continuing to decline) corresponds with fasting readings less than 130 and 2 hour after meal readings less than 180.   Moving forward, Dr. Derrel Nip and I think stopping the glipizide and adding a different medication that can lower your sugars AND protect your kidneys from continuing to decline is a better choice for you in the long run. Please consider this. Talk to Dr. Holley Raring about a medication called Jeanette Spencer instead of glipizide. This would be a great choice for you.   I'll see you in a few weeks!  [name], PharmD [phone]    Visit Information  PATIENT GOALS:  Goals Addressed               This Visit's Progress     Patient Stated     Medication Management (pt-stated)        Patient Goals/Self-Care Activities Over the next 90 days, patient will:  - take medications as prescribed check glucose twice daily, document, and provide at future appointments target a minimum of 150 minutes of moderate intensity exercise weekly engage in dietary modifications by reducing carbohydrate portion sizes          Patient verbalizes understanding of instructions provided today and agrees to view in [place].  Plan: Face to Face appointment with care management team member scheduled for: ~ 6 weeks  [name], PharmD, [place], [place] Clinical Pharmacist [employer] [facility] [employer] [phone]

## 2021-03-25 NOTE — Chronic Care Management (AMB) (Signed)
Chronic Care Management Pharmacy Note  03/25/2021 Name:  Jeanette Spencer MRN:  [id number] DOB:  1951/07/05   Subjective: Jeanette Spencer is an 70 y.o. year old female who is a primary patient of Derrel Nip, Aris Everts, MD.  The CCM team was consulted for assistance with disease management and care coordination needs.    Engaged with patient by telephone for follow up visit in response to provider referral for pharmacy case management and/or care coordination services.   Consent to Services:  The patient was given information about Chronic Care Management services, agreed to services, and gave verbal consent prior to initiation of services.  Please see initial visit note for detailed documentation.   Patient Care Team: [name], MD as PCP - General (Internal Medicine) Lequita Asal, MD as Referring Physician (Hematology and Oncology) Anthonette Legato, MD (Nephrology) De Hollingshead, RPH-CPP (Pharmacist)    Objective:  Lab Results  Component Value Date   CREATININE 1.41 (H) 02/19/2021   CREATININE 1.33 (H) 02/01/2021   CREATININE 1.25 (H) 09/15/2020    Lab Results  Component Value Date   HGBA1C 8.2 (H) 02/01/2021   Last diabetic Eye exam:  Lab Results  Component Value Date/Time   HMDIABEYEEXA No Retinopathy 11/26/2017 12:00 AM    Last diabetic Foot exam:  Lab Results  Component Value Date/Time   HMDIABFOOTEX normal 12/22/2014 12:00 AM        Component Value Date/Time   CHOL 172 02/01/2021 0816   TRIG 117.0 02/01/2021 0816   HDL 57.60 02/01/2021 0816   CHOLHDL 3 02/01/2021 0816   VLDL 23.4 02/01/2021 0816   LDLCALC 91 02/01/2021 0816   LDLDIRECT 172.0 09/27/2017 1624    Hepatic Function Latest Ref Rng & Units 02/01/2021 09/15/2020 06/16/2020  Total Protein 6.0 - 8.3 g/dL 7.1 7.4 6.9  Albumin 3.5 - 5.2 g/dL 4.2 4.3 4.2  AST 0 - 37 U/L 11 13 13   ALT 0 - 35 U/L 11 13 12   Alk Phosphatase 39 - 117 U/L 74 67 70  Total Bilirubin 0.2 - 1.2 mg/dL 0.2  0.4 0.3    Lab Results  Component Value Date/Time   TSH 2.49 06/16/2020 07:59 AM   TSH 1.37 09/27/2017 04:24 PM   FREET4 0.79 09/27/2017 04:24 PM    CBC Latest Ref Rng & Units 10/19/2020 07/20/2020 04/20/2020  WBC 4.0 - 10.5 K/uL 9.6 10.1 10.3  Hemoglobin 12.0 - 15.0 g/dL 11.9(L) 11.3(L) 11.1(L)  Hematocrit 36.0 - 46.0 % 37.9 36.3 34.9(L)  Platelets 150 - 400 K/uL 286 276 276    Lab Results  Component Value Date/Time   VD25OH 24 (L) 04/11/2013 10:49 AM    Clinical ASCVD: No  The 10-year ASCVD risk score Mikey Bussing DC Jr., et al., 2013) is: 30.9%   Values used to calculate the score:     Age: 31 years     Sex: Female     Is Non-Hispanic African American: Yes     Diabetic: Yes     Tobacco smoker: No     Systolic Blood Pressure: 831 mmHg     Is BP treated: Yes     HDL Cholesterol: 57.6 mg/dL     Total Cholesterol: 172 mg/dL     Social History   Tobacco Use  Smoking Status Never  Smokeless Tobacco Never   BP Readings from Last 3 Encounters:  02/03/21 (!) 148/78  11/04/20 (!) 152/74  10/19/20 (!) 153/80   Pulse Readings from Last 3 Encounters:  02/03/21 88  11/04/20 92  10/19/20 78   Wt Readings from Last 3 Encounters:  02/03/21 194 lb 9.6 oz (88.3 kg)  11/04/20 189 lb 9.6 oz (86 kg)  10/19/20 190 lb 5.9 oz (86.4 kg)    Assessment: Review of patient past medical history, allergies, medications, health status, including review of consultants reports, laboratory and other test data, was performed as part of comprehensive evaluation and provision of chronic care management services.   SDOH:  (Social Determinants of Health) assessments and interventions performed:  SDOH Interventions    Flowsheet Row Most Recent Value  SDOH Interventions   Financial Strain Interventions Intervention Not Indicated  Stress Interventions Other (Comment)  [provided empathetic listening]       CCM Care Plan  Allergies  Allergen Reactions   Oxycodone    Glipizide     Recurrent  hypoglycemia   Lipitor [Atorvastatin]     Affected balance   Penicillin G     Other reaction(s): Unknown    Medications Reviewed Today     Reviewed by De Hollingshead, RPH-CPP (Pharmacist) on 03/25/21 at 39  Med List Status: <None>   Medication Order Taking? Sig Documenting Provider Last Dose Status Informant  amLODipine (NORVASC) 5 MG tablet [id number] Yes Take 1 tablet (5 mg total) by mouth daily. [name], MD Taking Active   atorvastatin (LIPITOR) 20 MG tablet [id number] Yes Take 1 tablet by mouth once daily [name], MD Taking Active   Cyanocobalamin (B-12 COMPLIANCE INJECTION) 1000 MCG/ML KIT [id number] Yes Inject 1,000 mcg as directed every 30 (thirty) days. Sindy Guadeloupe, MD Taking Active   ferrous sulfate 325 (65 FE) MG tablet [id number] Yes Take 325 mg by mouth daily with breakfast. [provider] Taking Active   glipiZIDE (GLUCOTROL) 5 MG tablet [id number] No Take 5 mg by mouth as needed.  Patient not taking: Reported on 03/25/2021   [provider] Not Taking Active            Med Note De Hollingshead   Thu Mar 25, 2021  1:15 PM) Taking PRN  glucose blood test strip [id number] Yes Use as instructed to check sugars twice daily.  Uncontrolled diabetes mellitus E11.65 [name], MD Taking Active   losartan (COZAAR) 50 MG tablet [id number] Yes Take 1 tablet (50 mg total) by mouth daily. [name], MD Taking Active   metFORMIN (GLUCOPHAGE) 500 MG tablet [id number] Yes Take 1 tablet (500 mg total) by mouth 2 (two) times daily with a meal. [name], MD Taking Active   Multiple Vitamins-Minerals (MULTIVITAMIN ADULT) CHEW [id number] Yes Chew 1 each by mouth daily. [provider] Taking Active             Patient Active Problem List   Diagnosis Date Noted   Hypertensive retinopathy 02/06/2021   Screening for osteoporosis 01/02/2021   Iron deficiency anemia 04/26/2020   Normocytic anemia 01/06/2020   Abnormal SPEP  01/06/2020   CKD stage 3 due to type 2 diabetes mellitus ([place]) 09/02/2019   Educated about COVID-19 virus infection 02/09/2019   Cough 02/09/2019   Positive FIT (fecal immunochemical test)    Polyp of sigmoid colon    Diabetic nephropathy associated with type 2 diabetes mellitus ([place]) 09/28/2017   Alopecia of scalp 09/28/2017   Breast cancer screening 09/28/2017   Morbid obesity ([place]) 12/23/2014   Statin intolerance 05/23/2013   B12 deficiency 05/23/2013   Hyperlipidemia associated with type 2 diabetes mellitus ([place])  04/13/2013   Essential hypertension, benign 04/13/2013   Type 2 diabetes mellitus with nephropathy ([place])     Immunization History  Administered Date(s) Administered   Influenza Whole 07/23/2012   Influenza-Unspecified 07/11/2013, 07/10/2014, 07/11/2017, 07/11/2018, 07/16/2019, 06/26/2020   Moderna Sars-Covid-2 Vaccination 11/20/2019, 12/25/2019, 01/22/2020   Pneumococcal Conjugate-13 07/12/2013   Pneumococcal Polysaccharide-23 09/27/2017   Tdap 05/23/2005, 07/12/2013    Conditions to be addressed/monitored: HTN, HLD, DMII, and CKD  Care Plan : Medication Management  Updates made by De Hollingshead, RPH-CPP since 03/25/2021 12:00 AM     Problem: CKD, DM, HTN, HLD      Long-Range Goal: Disease Progression Prevention   Start Date: 02/11/2021  This Visit's Progress: On track  Recent Progress: On track  Priority: High  Note:   Current Barriers:  Unable to achieve control of diabetes   Pharmacist Clinical Goal(s):  Over the next 90 days, patient will achieve control of diabetes as evidenced by A1c  through collaboration with PharmD and provider.   Interventions: 1:1 collaboration with [name], MD regarding development and update of comprehensive plan of care as evidenced by provider attestation and co-signature Inter-disciplinary care team collaboration (see longitudinal plan of care) Comprehensive medication review performed; medication list  updated in electronic medical record  SDOH: Variable daily routine in regard to bedtime, meal time. Has been back and forth to [facility] caring for a sister in the hospital. Also caring for great grandchildren.   Health Maintenance: DM eye exam - will remind to schedule and have results sent to our office Shingrix vaccine - due, will discuss moving forward.   Diabetes: Uncontrolled and opportunities for optimization; current treatment: metformin 500 mg BID, glipizide 5 mg PRN "higher sugars", but cannot verbalize a specific rule. Notes she has not been taking lately.  Hx higher metformin doses, currently limited by renal function Current glucose readings: has not been checking lately.  Denies hypoglycemic symptoms since restarting glipizide  Denies hyperglycemic symptoms recently, including polyuria, polydipsia, polyphagia, blurred vision. Reports she stays hydrated so urinates often at baseline.  Again provided education on safety, efficacy, long term nephroprotective benefits of SGLT2. Patient again declines to make any changes today. Reviewed risk of hypoglycemia, weight gain with glipizide. Patient elects to continue current regimen at this time and plans to take glipizide more often. Discussed only taking once daily before biggest meal. Also reviewed that SGLT2 would be easier administration - once daily with her morning medications, instead of specifically related to meal times.  Reviewed risk of long term micro and macrovascular complications with uncontrolled A1c. Again encouraged that if next A1c is not improved, we should consider medication changes. Will meet with patient face to face at next PCP visit.   Hypertension: Uncontrolled at last office visit; current treatment: losartan 50 mg QPM, amlodipine 5 mg QAM  Current home readings: has not checked at home recently.  Continue current regimen at this time. Previously advised to check BP at home to report at future  readings  Hyperlipidemia: Controlled at goal <100; current treatment: atorvastatin 20 mg daily  Previously recommend to continue current regimen at this time  Supplements: Vitamin B12 once monthly  Patient Goals/Self-Care Activities Over the next 90 days, patient will:  - take medications as prescribed check glucose twice daily, document, and provide at future appointments target a minimum of 150 minutes of moderate intensity exercise weekly engage in dietary modifications by reducing carbohydrate portion sizes  Follow Up Plan: Face to Face appointment with care  management team member scheduled for:  ~ 6 weeks      Medication Assistance: None required.  Patient affirms current coverage meets needs.  Patient's preferred pharmacy is:  [facility] [address], [place] - [place] [address] Phone: [phone] Fax: [phone]  [facility], [place] [address] Phone: [phone] Fax: [phone]  [facility] [address] [place] [address] [place] [address] Phone: [phone] Fax: [facility] [address], [place] - [place] [address] Phone: [phone] Fax: [phone]   Follow Up:  Patient agrees to Care Plan and Follow-up.  Plan: Face to Face appointment with care management team member scheduled for: ~ 6 weeks  [name], PharmD, [place], [place] Clinical Pharmacist [employer] at [employer] [phone]

## 2021-04-16 ENCOUNTER — Other Ambulatory Visit: Payer: Self-pay | Admitting: *Deleted

## 2021-04-16 DIAGNOSIS — D509 Iron deficiency anemia, unspecified: Secondary | ICD-10-CM

## 2021-04-19 ENCOUNTER — Other Ambulatory Visit: Payer: Medicare (Managed Care)

## 2021-04-20 ENCOUNTER — Other Ambulatory Visit: Payer: Self-pay

## 2021-04-20 ENCOUNTER — Inpatient Hospital Stay: Payer: Medicare (Managed Care) | Attending: Oncology

## 2021-04-20 DIAGNOSIS — D509 Iron deficiency anemia, unspecified: Secondary | ICD-10-CM | POA: Diagnosis present

## 2021-04-20 LAB — IRON AND TIBC
Iron: 67 ug/dL (ref 28–170)
Saturation Ratios: 21 % (ref 10.4–31.8)
TIBC: 322 ug/dL (ref 250–450)
UIBC: 255 ug/dL

## 2021-04-20 LAB — CBC WITH DIFFERENTIAL/PLATELET
Abs Immature Granulocytes: 0.04 10*3/uL (ref 0.00–0.07)
Basophils Absolute: 0 10*3/uL (ref 0.0–0.1)
Basophils Relative: 0 %
Eosinophils Absolute: 0.1 10*3/uL (ref 0.0–0.5)
Eosinophils Relative: 1 %
HCT: 36.8 % (ref 36.0–46.0)
Hemoglobin: 11.7 g/dL — ABNORMAL LOW (ref 12.0–15.0)
Immature Granulocytes: 0 %
Lymphocytes Relative: 28 %
Lymphs Abs: 3 10*3/uL (ref 0.7–4.0)
MCH: 26.2 pg (ref 26.0–34.0)
MCHC: 31.8 g/dL (ref 30.0–36.0)
MCV: 82.5 fL (ref 80.0–100.0)
Monocytes Absolute: 0.6 10*3/uL (ref 0.1–1.0)
Monocytes Relative: 6 %
Neutro Abs: 7 10*3/uL (ref 1.7–7.7)
Neutrophils Relative %: 65 %
Platelets: 267 10*3/uL (ref 150–400)
RBC: 4.46 MIL/uL (ref 3.87–5.11)
RDW: 15.2 % (ref 11.5–15.5)
WBC: 10.8 10*3/uL — ABNORMAL HIGH (ref 4.0–10.5)
nRBC: 0 % (ref 0.0–0.2)

## 2021-04-20 LAB — FERRITIN: Ferritin: 25 ng/mL (ref 11–307)

## 2021-05-07 ENCOUNTER — Telehealth: Payer: Self-pay | Admitting: Pharmacist

## 2021-05-07 ENCOUNTER — Encounter: Payer: Self-pay | Admitting: Internal Medicine

## 2021-05-07 ENCOUNTER — Ambulatory Visit: Payer: Medicare (Managed Care)

## 2021-05-07 ENCOUNTER — Other Ambulatory Visit: Payer: Self-pay

## 2021-05-07 ENCOUNTER — Ambulatory Visit (INDEPENDENT_AMBULATORY_CARE_PROVIDER_SITE_OTHER): Payer: Medicare (Managed Care) | Admitting: Internal Medicine

## 2021-05-07 VITALS — BP 136/80 | HR 85 | Resp 16 | Ht 59.25 in | Wt 194.8 lb

## 2021-05-07 DIAGNOSIS — Z1152 Encounter for screening for COVID-19: Secondary | ICD-10-CM

## 2021-05-07 DIAGNOSIS — J209 Acute bronchitis, unspecified: Secondary | ICD-10-CM | POA: Diagnosis not present

## 2021-05-07 DIAGNOSIS — J440 Chronic obstructive pulmonary disease with acute lower respiratory infection: Secondary | ICD-10-CM

## 2021-05-07 DIAGNOSIS — R059 Cough, unspecified: Secondary | ICD-10-CM | POA: Diagnosis not present

## 2021-05-07 MED ORDER — LEVOFLOXACIN 500 MG PO TABS: 500.0000 mg | ORAL_TABLET | Freq: Every day | ORAL | 0 refills | Status: AC

## 2021-05-07 MED ORDER — PREDNISONE 10 MG PO TABS: ORAL_TABLET | ORAL | 0 refills | Status: DC

## 2021-05-07 MED ORDER — ALBUTEROL SULFATE HFA 108 (90 BASE) MCG/ACT IN AERS: 2.0000 | INHALATION_SPRAY | Freq: Four times a day (QID) | RESPIRATORY_TRACT | 0 refills | Status: DC | PRN

## 2021-05-07 NOTE — Telephone Encounter (Signed)
  Chronic Care Management   Note  05/07/2021 Name: Jeanette Spencer MRN: [id number] DOB: 05-03-1951  Given infectious symptoms, will reschedule our follow up appointment. Will collaborate w/ Care Guide to outreach patient to r/s follow up for a phone visit.  [name], PharmD, [place], [place] Clinical Pharmacist [employer] at [place]

## 2021-05-07 NOTE — Progress Notes (Signed)
Subjective:  Patient ID: Jeanette Spencer, female    DOB: 13-Aug-1951  Age: 70 y.o. MRN: [id number]  CC: The primary encounter diagnosis was Encounter for screening for COVID-19. Diagnoses of Cough and Acute bronchitis with COPD ([place]) were also pertinent to this visit.  HPI [name] presents for diabetes follow up but presented acutely ill with "my usual bronchitis" in the setting of close contact with a sick grandchild whose  symptoms stated a week  ago.  Patient is asked to care for granddaughter whenever she is sick so patient's daughter doesn't have to miss work.  Patient reports Wheezing,  chills ,  sweats., but has not checked temp.  Using robitussin but cough is persistent at night . Cough was initially dry but has become productive.  Left side of throat and left ear is sore   Similar symptoms one month ago after caring for granddaughter.   Outpatient Medications Prior to Visit  Medication Sig Dispense Refill   amLODipine (NORVASC) 5 MG tablet Take 1 tablet (5 mg total) by mouth daily. 90 tablet 3   atorvastatin (LIPITOR) 20 MG tablet Take 1 tablet by mouth once daily 90 tablet 3   Cyanocobalamin (B-12 COMPLIANCE INJECTION) 1000 MCG/ML KIT Inject 1,000 mcg as directed every 30 (thirty) days. 1 kit 5   ferrous sulfate 325 (65 FE) MG tablet Take 325 mg by mouth daily with breakfast.     glucose blood test strip Use as instructed to check sugars twice daily.  Uncontrolled diabetes mellitus E11.65 100 each 12   losartan (COZAAR) 50 MG tablet Take 1 tablet (50 mg total) by mouth daily. 90 tablet 1   metFORMIN (GLUCOPHAGE) 500 MG tablet Take 1 tablet (500 mg total) by mouth 2 (two) times daily with a meal. 180 tablet 1   Multiple Vitamins-Minerals (MULTIVITAMIN ADULT) CHEW Chew 1 each by mouth daily.     glipiZIDE (GLUCOTROL) 5 MG tablet Take 5 mg by mouth as needed. (Patient not taking: No sig reported)     No facility-administered medications prior to visit.    Review of  Systems;  Patient denies headache, fevers, malaise, unintentional weight loss, skin rash, eye pain, sinus congestion and sinus pain, sore throat, dysphagia,  hemoptysis , cough, dyspnea, wheezing, chest pain, palpitations, orthopnea, edema, abdominal pain, nausea, melena, diarrhea, constipation, flank pain, dysuria, hematuria, urinary  Frequency, nocturia, numbness, tingling, seizures,  Focal weakness, Loss of consciousness,  Tremor, insomnia, depression, anxiety, and suicidal ideation.      Objective:  BP 136/80 (BP Location: Left Arm, Patient Position: Sitting, Cuff Size: Normal)   Pulse 85   Resp 16   Ht 4' 11.25" (1.505 m)   Wt 194 lb 12.8 oz (88.4 kg)   SpO2 99%   BMI 39.01 kg/m   BP Readings from Last 3 Encounters:  05/07/21 136/80  02/03/21 (!) 148/78  11/04/20 (!) 152/74    Wt Readings from Last 3 Encounters:  05/07/21 194 lb 12.8 oz (88.4 kg)  02/03/21 194 lb 9.6 oz (88.3 kg)  11/04/20 189 lb 9.6 oz (86 kg)    General appearance: alert, cooperative and appears stated age Ears: left  TM dull and erythematous ,normal right TM.  Normal external ear canals both ears Throat: lips, mucosa, and tongue normal; teeth and gums normal Neck: no adenopathy, no carotid bruit, supple, symmetrical, trachea midline and thyroid not enlarged, symmetric, no tenderness/mass/nodules Back: symmetric, no curvature. ROM normal. No CVA tenderness. Lungs: clear to auscultation bilaterally Heart: regular  rate and rhythm, S1, S2 normal, no murmur, click, rub or gallop Abdomen: soft, non-tender; bowel sounds normal; no masses,  no organomegaly Pulses: 2+ and symmetric Skin: Skin color, texture, turgor normal. No rashes or lesions Lymph nodes: Cervical, supraclavicular, and axillary nodes normal.  Lab Results  Component Value Date   HGBA1C 8.2 (H) 02/01/2021   HGBA1C 7.5 (H) 09/15/2020   HGBA1C 7.6 (H) 06/16/2020    Lab Results  Component Value Date   CREATININE 1.41 (H) 02/19/2021    CREATININE 1.33 (H) 02/01/2021   CREATININE 1.25 (H) 09/15/2020    Lab Results  Component Value Date   WBC 10.8 (H) 04/20/2021   HGB 11.7 (L) 04/20/2021   HCT 36.8 04/20/2021   PLT 267 04/20/2021   GLUCOSE 165 (H) 02/19/2021   CHOL 172 02/01/2021   TRIG 117.0 02/01/2021   HDL 57.60 02/01/2021   LDLDIRECT 172.0 09/27/2017   LDLCALC 91 02/01/2021   ALT 11 02/01/2021   AST 11 02/01/2021   NA 140 02/19/2021   K 4.5 02/19/2021   CL 104 02/19/2021   CREATININE 1.41 (H) 02/19/2021   BUN 29 (H) 02/19/2021   CO2 28 02/19/2021   TSH 2.49 06/16/2020   HGBA1C 8.2 (H) 02/01/2021   MICROALBUR 7.9 (H) 02/01/2021    US RENAL  Result Date: 12/19/2019 CLINICAL DATA:  Chronic renal disease EXAM: RENAL / URINARY TRACT ULTRASOUND COMPLETE COMPARISON:  None. FINDINGS: Right Kidney: Renal measurements: 12.3 x 4.7 x 4.6 cm. = volume: 139 mL . Echogenicity within normal limits. No mass or hydronephrosis visualized. Left Kidney: Renal measurements: 11.6 x 6.1 x 4.5 cm = volume: 162 mL. No mass lesion or hydronephrosis is noted. Seventeen calcification is noted in the lower pole without obstructive change. Bladder: Appears normal for degree of bladder distention. Other: None. IMPRESSION: Nonobstructing left renal stone. No other focal abnormality is noted. Electronically Signed   By: Inez Catalina M.D.   On: 12/19/2019 16:24    Assessment & Plan:   Problem List Items Addressed This Visit       Unprioritized   Cough   Relevant Orders   COVID-19, Flu A+B and RSV (Completed)   Acute bronchitis with COPD ([place])    COVID suspected but PCT is negative,  s is RSV and influenza rapid test. Will treat with steroid, albuterol and levaquin given concurrent left ear pain worrisome for early onset of otitis media       Relevant Medications   predniSONE (DELTASONE) 10 MG tablet   albuterol (VENTOLIN HFA) 108 (90 Base) MCG/ACT inhaler   Other Visit Diagnoses     Encounter for screening for COVID-19    -   Primary   Relevant Orders   COVID-19, Flu A+B and RSV (Completed)       Meds ordered this encounter  Medications   levofloxacin (LEVAQUIN) 500 MG tablet    Sig: Take 1 tablet (500 mg total) by mouth daily for 7 days.    Dispense:  7 tablet    Refill:  0   predniSONE (DELTASONE) 10 MG tablet    Sig: 6 tablets daily for 3 days, then reduce by 1 tablet daily until gone    Dispense:  33 tablet    Refill:  0   albuterol (VENTOLIN HFA) 108 (90 Base) MCG/ACT inhaler    Sig: Inhale 2 puffs into the lungs every 6 (six) hours as needed for wheezing or shortness of breath.    Dispense:  3.7 g  Refill:  0    There are no discontinued medications.  Follow-up: Return in about 4 weeks (around 06/04/2021) for follow up diabetes.   [name], MD

## 2021-05-07 NOTE — Patient Instructions (Signed)
You may have COVID;  we will not know for sure for 48 hours.   Please stay home until the results are known.  I am prescribing prednisone taper, levaquin,  and albuterol inhaler for your symptoms because you may have an ear infection or pneumonia  Continue your otc cough medication   Reschedule your diabetes follow up

## 2021-05-09 DIAGNOSIS — J440 Chronic obstructive pulmonary disease with acute lower respiratory infection: Secondary | ICD-10-CM | POA: Insufficient documentation

## 2021-05-09 DIAGNOSIS — J209 Acute bronchitis, unspecified: Secondary | ICD-10-CM | POA: Insufficient documentation

## 2021-05-09 LAB — COVID-19, FLU A+B AND RSV
Influenza A, NAA: NOT DETECTED
Influenza B, NAA: NOT DETECTED
RSV, NAA: NOT DETECTED
SARS-CoV-2, NAA: NOT DETECTED

## 2021-05-09 NOTE — Assessment & Plan Note (Signed)
COVID suspected but PCT is negative,  s is RSV and influenza rapid test. Will treat with steroid, albuterol and levaquin given concurrent left ear pain worrisome for early onset of otitis media

## 2021-05-11 NOTE — Telephone Encounter (Signed)
Patient has been rescheduled.

## 2021-05-18 ENCOUNTER — Encounter: Payer: Self-pay | Admitting: Emergency Medicine

## 2021-05-18 ENCOUNTER — Ambulatory Visit
Admission: EM | Admit: 2021-05-18 | Discharge: 2021-05-18 | Disposition: A | Discharge status: Home / Self Care | Payer: Medicare (Managed Care) | Attending: Sports Medicine | Admitting: Sports Medicine

## 2021-05-18 ENCOUNTER — Other Ambulatory Visit: Payer: Self-pay

## 2021-05-18 DIAGNOSIS — R110 Nausea: Secondary | ICD-10-CM

## 2021-05-18 DIAGNOSIS — R42 Dizziness and giddiness: Secondary | ICD-10-CM

## 2021-05-18 HISTORY — DX: Dizziness and giddiness: R42

## 2021-05-18 MED ORDER — ONDANSETRON HCL 4 MG PO TABS: 4.0000 mg | ORAL_TABLET | Freq: Four times a day (QID) | ORAL | 0 refills | Status: DC

## 2021-05-18 MED ORDER — MECLIZINE HCL 25 MG PO TABS: 25.0000 mg | ORAL_TABLET | Freq: Three times a day (TID) | ORAL | 0 refills | Status: DC | PRN

## 2021-05-18 NOTE — ED Triage Notes (Signed)
Pt presents today with c/o of "the room spinning" that began last evening as she reached up to grab something. +nausea, denies v/d.   She feels she has vertigo.

## 2021-05-18 NOTE — Discharge Instructions (Addendum)
As we discussed, I am treating you for vertigo.  I believe you have benign positional vertigo.  I am giving you meclizine which is also called Antivert. For your nausea I also gave you a medicine called Zofran.  Do not use either medicine unless you need to take it. Please contact Dr. Lupita Dawn office and let them know that you were seen in the urgent care and you were given these medicines and I am recommending that you follow-up sooner than your scheduled follow-up in 1 month. Also let them know that you stop the prednisone and the Levaquin because of the symptoms you are having. Please see educational handouts. If you have difficulty getting into see Dr. Derrel Nip your symptoms persist then please seek out medical care either here, or at an urgent care that is closer to home.  If your symptoms worsen then please go to the emergency room.

## 2021-05-18 NOTE — ED Provider Notes (Signed)
[facility]    CSN: [id number] Arrival date & time: 05/18/21  1625      History   Chief Complaint Chief Complaint  Patient presents with   Dizziness    HPI Jeanette Spencer is a 70 y.o. female.   70 year old female who presents for evaluation of vertigo.  She reports last night she got up from a chair and after questioning she felt like it was spinning.  She does have some associated intermittent nausea.  She denies any vomiting or diarrhea.  No fever shakes chills.  She had a similar episode about 12 to 13 years ago.  She denies any ear pain or fullness.  No hearing changes.  No vision issues.  No painful vision, double vision, or blurry vision.  Normally sees Dr. Derrel Nip for ongoing medical care.  She is retired and lives alone in [place].  She denies chest pain or shortness of breath.  No COVID symptoms.  She was seen at Dr. Lupita Dawn office on August 12 and had a COVID test that was negative.  She denies any COVID symptoms.  She was treated for a bacterial infection and was given Levaquin and prednisone.  She said that her blood sugars were in the 200s and she just felt awful so she stopped both the Levaquin and the prednisone.  She only took it for 3 days.  She said that she is okay if she sits or she lays down but if she rises from a seated position and moves around the vertigo symptoms get worse.  She denies any diaphoresis, jaw pain, intermittent claudication, chest pain, or shortness of breath.    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   Vertigo     Patient Active Problem List   Diagnosis Date Noted   Vertigo 05/18/2021   Acute bronchitis with COPD ([place]) 05/09/2021   Hypertensive retinopathy 02/06/2021   Screening for osteoporosis  01/02/2021   Iron deficiency anemia 04/26/2020   Normocytic anemia 01/06/2020   Abnormal SPEP 01/06/2020   CKD stage 3 due to type 2 diabetes mellitus ([place]) 09/02/2019   Educated about COVID-19 virus infection 02/09/2019   Cough 02/09/2019   Positive FIT (fecal immunochemical test)    Polyp of sigmoid colon    Diabetic nephropathy associated with type 2 diabetes mellitus ([place]) 09/28/2017   Alopecia of scalp 09/28/2017   Breast cancer screening 09/28/2017   Morbid obesity ([place]) 12/23/2014   Statin intolerance 05/23/2013   B12 deficiency 05/23/2013   Hyperlipidemia associated with type 2 diabetes mellitus ([place]) 04/13/2013   Essential hypertension, benign 04/13/2013   Type 2 diabetes mellitus with nephropathy ([place])     Past Surgical History:  Procedure Laterality Date   ABDOMINAL HYSTERECTOMY     abdominal tumor Left    size of grapefruit   COLONOSCOPY WITH PROPOFOL N/A 09/24/2018   Procedure: COLONOSCOPY WITH BIOPSY;  Surgeon: [name], MD;  Location: [facility];  Service: Endoscopy;  Laterality: N/A;  diabetic - oral meds  requests arrival after 8:30   POLYPECTOMY N/A 09/24/2018   Procedure: POLYPECTOMY;  Surgeon: [name], MD;  Location: [facility];  Service: Endoscopy;  Laterality: N/A;  TONSILECTOMY, ADENOIDECTOMY, BILATERAL MYRINGOTOMY AND TUBES      OB History   No obstetric history on file.      Home Medications    Prior to Admission medications   Medication Sig Start Date End Date Taking? Authorizing Provider  meclizine (ANTIVERT) 25 MG tablet Take 1 tablet (25 mg total) by mouth 3 (three) times daily as needed for dizziness. 05/18/21  Yes Verda Cumins, MD  ondansetron (ZOFRAN) 4 MG tablet Take 1 tablet (4 mg total) by mouth every 6 (six) hours. 05/18/21  Yes Verda Cumins, MD  albuterol (VENTOLIN HFA) 108 (90 Base) MCG/ACT inhaler Inhale 2 puffs into the lungs every 6 (six) hours as needed for wheezing or shortness of breath. 05/07/21    [name], MD  amLODipine (NORVASC) 5 MG tablet Take 1 tablet (5 mg total) by mouth daily. 11/04/20   [name], MD  atorvastatin (LIPITOR) 20 MG tablet Take 1 tablet by mouth once daily 01/25/21   [name], MD  Cyanocobalamin (B-12 COMPLIANCE INJECTION) 1000 MCG/ML KIT Inject 1,000 mcg as directed every 30 (thirty) days. 03/17/21   Sindy Guadeloupe, MD  ferrous sulfate 325 (65 FE) MG tablet Take 325 mg by mouth daily with breakfast.    [provider]  glipiZIDE (GLUCOTROL) 5 MG tablet Take 5 mg by mouth as needed. Patient not taking: No sig reported    [provider]  glucose blood test strip Use as instructed to check sugars twice daily.  Uncontrolled diabetes mellitus E11.65 09/07/15   [name], MD  losartan (COZAAR) 50 MG tablet Take 1 tablet (50 mg total) by mouth daily. 02/03/21   [name], MD  metFORMIN (GLUCOPHAGE) 500 MG tablet Take 1 tablet (500 mg total) by mouth 2 (two) times daily with a meal. 01/21/21   [name], MD  Multiple Vitamins-Minerals (MULTIVITAMIN ADULT) CHEW Chew 1 each by mouth daily.    [provider]  predniSONE (DELTASONE) 10 MG tablet 6 tablets daily for 3 days, then reduce by 1 tablet daily until gone 05/07/21   [name], MD    Family History Family History  Problem Relation Age of Onset   Diabetes Mother    Cancer Father    Cancer Paternal Aunt    Cancer Paternal Uncle    Diabetes Paternal Grandmother     Social History Social History   Tobacco Use   Smoking status: Never   Smokeless tobacco: Never  Vaping Use   Vaping Use: Never used  Substance Use Topics   Alcohol use: Yes    Comment:  rarely - Holidays   Drug use: No     Allergies   Oxycodone, Glipizide, Lipitor [atorvastatin], and Penicillin g   Review of Systems Review of Systems  Constitutional:  Positive for activity change. Negative for appetite change, chills, diaphoresis, fatigue and fever.  HENT:  Negative for  congestion, ear pain, postnasal drip, rhinorrhea, sinus pressure, sinus pain, sneezing and sore throat.   Eyes:  Negative for photophobia, pain, discharge, redness, itching and visual disturbance.  Respiratory:  Negative for cough, chest tightness and shortness of breath.   Cardiovascular:  Negative for chest pain and palpitations.  Gastrointestinal:  Positive for nausea. Negative for abdominal pain, diarrhea and vomiting.  Genitourinary:  Negative for dysuria.  Musculoskeletal:  Negative for back pain, myalgias and neck pain.  Skin:  Negative for color change, pallor, rash and wound.  Neurological:  Positive for dizziness and  light-headedness. Negative for seizures, syncope, facial asymmetry, speech difficulty, weakness, numbness and headaches.  All other systems reviewed and are negative.   Physical Exam Triage Vital Signs ED Triage Vitals  Enc Vitals Group     BP 05/18/21 1657 140/68     Pulse Rate 05/18/21 1657 75     Resp 05/18/21 1657 16     Temp 05/18/21 1657 98.1 F (36.7 C)     Temp Source 05/18/21 1657 Oral     SpO2 05/18/21 1657 98 %     Weight --      Height --      Head Circumference --      Peak Flow --      Pain Score 05/18/21 1654 0     Pain Loc --      Pain Edu? --      Excl. in [place]? --    No data found.  Updated Vital Signs BP 140/68 (BP Location: Right Arm)   Pulse 75   Temp 98.1 F (36.7 C) (Oral)   Resp 16   SpO2 98%   Visual Acuity Right Eye Distance:   Left Eye Distance:   Bilateral Distance:    Right Eye Near:   Left Eye Near:    Bilateral Near:     Physical Exam Vitals and nursing note reviewed.  Constitutional:      General: She is not in acute distress.    Appearance: Normal appearance. She is not ill-appearing, toxic-appearing or diaphoretic.  HENT:     Head: Normocephalic and atraumatic.     Right Ear: Tympanic membrane normal.     Left Ear: Tympanic membrane normal.     Nose: Nose normal. No congestion or rhinorrhea.      Mouth/Throat:     Mouth: Mucous membranes are moist.     Pharynx: No oropharyngeal exudate or posterior oropharyngeal erythema.  Eyes:     General: No scleral icterus.       Right eye: No discharge.        Left eye: No discharge.     Extraocular Movements: Extraocular movements intact.     Conjunctiva/sclera: Conjunctivae normal.     Pupils: Pupils are equal, round, and reactive to light.  Cardiovascular:     Rate and Rhythm: Normal rate and regular rhythm.     Pulses: Normal pulses.     Heart sounds: Normal heart sounds. No murmur heard.   No friction rub. No gallop.  Pulmonary:     Effort: Pulmonary effort is normal.     Breath sounds: Normal breath sounds. No stridor. No wheezing, rhonchi or rales.  Abdominal:     Palpations: Abdomen is soft.     Tenderness: There is no abdominal tenderness. There is no right CVA tenderness, left CVA tenderness, guarding or rebound.     Hernia: No hernia is present.  Musculoskeletal:     Cervical back: Normal range of motion and neck supple. No rigidity.  Skin:    General: Skin is warm and dry.     Capillary Refill: Capillary refill takes less than 2 seconds.     Coloration: Skin is not jaundiced.     Findings: No rash.  Neurological:     General: No focal deficit present.     Mental Status: She is alert and oriented to person, place, and time.     UC Treatments / Results  Labs (all labs ordered are listed, but only abnormal results are displayed) Labs Reviewed -  No data to display  EKG   Radiology No results found.  Procedures Procedures (including critical care time)  Medications Ordered in UC Medications - No data to display  Initial Impression / Assessment and Plan / UC Course  I have reviewed the triage vital signs and the nursing notes.  Pertinent labs & imaging results that were available during my care of the patient were reviewed by me and considered in my medical decision making (see chart for details).  Clinical  impression: 1.  Vertigo since yesterday consistent with benign positional vertigo 2.  Dizziness 3.  Nausea without vomiting 4.  Recent prednisone and Levaquin use but discontinued after 3 days secondary to increased blood sugars and diaphoresis and just feeling sick.  Treatment plan: 1.  The findings and treatment plan were discussed in detail with the patient.  Patient was in agreement. 2.  I discussed my findings with her and she agreed to be treated for vertigo.  I did give her meclizine. 3.  For nausea I did send in a prescription for Zofran. 4.  I advised her not to use either medicine unless she needed it. 5.  Plenty of rest, plenty of fluids, Tylenol or Motrin for any discomfort. 6.  I advised her to contact Dr. Lupita Dawn office and let her know that she was seen in the urgent care and medicines were prescribed.  Also advised her to let them know that she stopped the Levaquin and prednisone early. 7.  Educational handouts provided. 8.  She has difficulty getting in for follow-up with Dr. Lupita Dawn office and her symptoms persist she can need to come back here, but she lives in [place] so I recommended going to a medical practice closer to home. 9.  If symptoms were to worsen then she should go to the emergency room. 10.  She was discharged in stable condition will follow-up here as needed.    Final Clinical Impressions(s) / UC Diagnoses   Final diagnoses:  Vertigo  Dizziness  Nausea without vomiting     Discharge Instructions      As we discussed, I am treating you for vertigo.  I believe you have benign positional vertigo.  I am giving you meclizine which is also called Antivert. For your nausea I also gave you a medicine called Zofran.  Do not use either medicine unless you need to take it. Please contact Dr. Lupita Dawn office and let them know that you were seen in the urgent care and you were given these medicines and I am recommending that you follow-up sooner than your  scheduled follow-up in 1 month. Also let them know that you stop the prednisone and the Levaquin because of the symptoms you are having. Please see educational handouts. If you have difficulty getting into see Dr. Derrel Nip your symptoms persist then please seek out medical care either here, or at an urgent care that is closer to home.  If your symptoms worsen then please go to the emergency room.     ED Prescriptions     Medication Sig Dispense Auth. Provider   meclizine (ANTIVERT) 25 MG tablet Take 1 tablet (25 mg total) by mouth 3 (three) times daily as needed for dizziness. 30 tablet Verda Cumins, MD   ondansetron (ZOFRAN) 4 MG tablet Take 1 tablet (4 mg total) by mouth every 6 (six) hours. 12 tablet Verda Cumins, MD      PDMP not reviewed this encounter.   Verda Cumins, MD 05/18/21 1754

## 2021-06-02 ENCOUNTER — Telehealth: Payer: Medicare (Managed Care)

## 2021-07-14 ENCOUNTER — Other Ambulatory Visit: Payer: Self-pay | Admitting: Internal Medicine

## 2021-07-14 DIAGNOSIS — E119 Type 2 diabetes mellitus without complications: Secondary | ICD-10-CM

## 2021-08-11 ENCOUNTER — Ambulatory Visit: Payer: Medicare (Managed Care)

## 2021-10-20 ENCOUNTER — Ambulatory Visit: Payer: Medicare (Managed Care) | Admitting: Oncology

## 2021-10-20 ENCOUNTER — Other Ambulatory Visit: Payer: Medicare (Managed Care)

## 2021-12-02 ENCOUNTER — Ambulatory Visit: Payer: Self-pay | Admitting: Pharmacist

## 2021-12-02 NOTE — Chronic Care Management (AMB) (Signed)
?  Chronic Care Management  ? ?Note ? ?12/02/2021 ?Name: Jeanette Spencer MRN: [id number] DOB: 1951-08-21 ? ? ? ?Closing pharmacy CCM case at this time.  Patient has clinic contact information for future questions or concerns.  ? ?[name], PharmD, [place], CPP ?Clinical Pharmacist ?Therapist, music at [employer] ?[phone] ? ?

## 2022-09-07 NOTE — Telephone Encounter (Signed)
MyChart messgae sent to patient. 

## 2023-02-27 LAB — COLOGUARD

## 2024-06-05 ENCOUNTER — Encounter: Payer: Self-pay | Admitting: Student in an Organized Health Care Education/Training Program

## 2024-06-05 ENCOUNTER — Ambulatory Visit
Admission: RE | Admit: 2024-06-05 | Discharge: 2024-06-05 | Disposition: A | Discharge status: Home / Self Care | Payer: Self-pay | Source: Ambulatory Visit | Attending: Student in an Organized Health Care Education/Training Program | Admitting: Student in an Organized Health Care Education/Training Program

## 2024-06-05 ENCOUNTER — Ambulatory Visit (INDEPENDENT_AMBULATORY_CARE_PROVIDER_SITE_OTHER): Payer: Medicare (Managed Care) | Admitting: Student in an Organized Health Care Education/Training Program

## 2024-06-05 VITALS — BP 124/80 | HR 90 | Temp 97.1°F | Ht 59.25 in | Wt 183.0 lb

## 2024-06-05 DIAGNOSIS — I2602 Saddle embolus of pulmonary artery with acute cor pulmonale: Secondary | ICD-10-CM

## 2024-06-05 DIAGNOSIS — Z9289 Personal history of other medical treatment: Secondary | ICD-10-CM

## 2024-06-05 DIAGNOSIS — I2699 Other pulmonary embolism without acute cor pulmonale: Secondary | ICD-10-CM | POA: Diagnosis not present

## 2024-06-05 NOTE — Progress Notes (Unsigned)
 Synopsis: Referred in *** by Myra Geni ORN, FNP  Assessment & Plan:   #Acute saddle pulmonary embolism with acute cor pulmonale (HCC) (Primary)  Presents for follow-up after recent hospitalization with what appears to be intermediate risk pulmonary embolism with signs of RV dysfunction (elevated RVSP).  I do not have access to the patient's medical record and I am unable to see whether her troponin or BNP were elevated while in the hospital.  I was able to personally review her inpatient CT scan after reporting it through [employer] and do note her pulmonary emboli which appear to be in the segmental branches of the pulmonary arteries bilaterally.  For workup I will obtain a repeat echocardiogram to reevaluate her RV pressures and ensure resolution of her RV strain.  I will also obtain a duplex ultrasound of the lower extremities to assess for DVTs.  Furthermore, given this is a recurrence of her pulm embolism she will need to be on anticoagulation lifelong.  Patient was discharged on apixaban but she could not afford it and this was transitioned to warfarin by her PCP.  Her PCP is managing her warfarin dosage and INR levels.  Finally, I will refer her to hematology for consideration of hypercoagulability workup given recurrence.   - ECHOCARDIOGRAM COMPLETE; Future - VAS US  LOWER EXTREMITY VENOUS (DVT); Future - Ambulatory referral to Hematology / Oncology - Recommend age-appropriate cancer screening by PCP - Lifelong anticoagulation   Return in about 4 months (around 10/05/2024).  I spent *** minutes caring for this patient today, including {EM billing:28027}  Belva November, MD [facility] 06/05/2024 2:14 PM    End of visit medications:  No orders of the defined types were placed in this encounter.    Current Outpatient Medications:    amLODipine  (NORVASC ) 2.5 MG tablet, Take 2.5 mg by mouth daily., Disp: , Rfl:    atorvastatin  (LIPITOR) 20 MG tablet, Take 1  tablet by mouth once daily, Disp: 90 tablet, Rfl: 3   Cholecalciferol (VITAMIN D -3) 25 MCG (1000 UT) CAPS, Take 1 capsule by mouth daily., Disp: , Rfl:    glipiZIDE  (GLUCOTROL  XL) 2.5 MG 24 hr tablet, Take 2.5 mg by mouth daily., Disp: , Rfl:    glipiZIDE  (GLUCOTROL ) 5 MG tablet, Take 5 mg by mouth daily., Disp: , Rfl:    glucose blood test strip, Use as instructed to check sugars twice daily.  Uncontrolled diabetes mellitus E11.65, Disp: 100 each, Rfl: 12   losartan  (COZAAR ) 50 MG tablet, Take 1 tablet (50 mg total) by mouth daily., Disp: 90 tablet, Rfl: 1   meclizine  (ANTIVERT ) 25 MG tablet, Take 1 tablet (25 mg total) by mouth 3 (three) times daily as needed for dizziness., Disp: 30 tablet, Rfl: 0   metFORMIN  (GLUCOPHAGE ) 500 MG tablet, TAKE 1 TABLET BY MOUTH TWICE DAILY WITH A MEAL, Disp: 180 tablet, Rfl: 0   Multiple Vitamins-Minerals (MULTIVITAMIN ADULT) CHEW, Chew 1 each by mouth daily., Disp: , Rfl:    Omega-3 Fatty Acids (FISH OIL) 1000 MG CAPS, Take 1 capsule by mouth daily., Disp: , Rfl:    ondansetron  (ZOFRAN ) 4 MG tablet, Take 1 tablet (4 mg total) by mouth every 6 (six) hours., Disp: 12 tablet, Rfl: 0   predniSONE  (DELTASONE ) 10 MG tablet, 6 tablets daily for 3 days, then reduce by 1 tablet daily until gone, Disp: 33 tablet, Rfl: 0   warfarin (COUMADIN ) 5 MG tablet, Take 5 mg by mouth daily., Disp: , Rfl:  albuterol  (VENTOLIN  HFA) 108 (90 Base) MCG/ACT inhaler, Inhale 2 puffs into the lungs every 6 (six) hours as needed for wheezing or shortness of breath. (Patient not taking: Reported on 06/05/2024), Disp: 3.7 g, Rfl: 0   Cyanocobalamin  (B-12 COMPLIANCE INJECTION) 1000 MCG/ML KIT, Inject 1,000 mcg as directed every 30 (thirty) days. (Patient not taking: Reported on 06/05/2024), Disp: 1 kit, Rfl: 5   ferrous sulfate 325 (65 FE) MG tablet, Take 325 mg by mouth daily with breakfast. (Patient not taking: Reported on 06/05/2024), Disp: , Rfl:    Subjective:   PATIENT ID: Jeanette Spencer  GENDER: female DOB: May 22, 1951, MRN: [id number]  Chief Complaint  Patient presents with   Consult    Blood clots in lungs. SOB. Cough, dy. No wheezing.     HPI  Patient is a pleasant 73 year old female presenting to clinic for the evaluation of pulmonary embolism.  She was recently admitted to the hospital in [place].  Symptom onset was a couple weeks ago with increased shortness of breath with minimal exertion.  She was also starting to feel weak and being more dizzy.  She went to the grocery store where she got dizzy and syncopized without hitting her head.  EMS was activated and she was transferred to the nearest hospital.  In the hospital, a CT scan of the chest with PE protocol was positive for pulmonary embolism.  She was started on anticoagulation and discharged on Eliquis which she could not afford and was subsequently transition to warfarin by her outpatient provider.  An echocardiogram was performed while hospitalized which showed normal LV and RV function but with elevated RVSP of 75 mmHg.  Since discharge, patient has been seen by her PCP and transition to warfarin.  Patient currently reports some shortness of breath and feels weak.  She does not report any history of recent air travel or long car rides.  She does not have any personal history of malignancy.  She does report having had pulm embolism in 1990.  She reports that her PCP manages her cancer screening.  Patient denies any history of smoking.  She previously worked at a 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  Her past medical history is notable for hypertension, CKD, hyperlipidemia, type 2 diabetes, PE in 1990, and recent pulmonary embolism.  Ancillary information including prior medications, full medical/surgical/family/social histories, and PFTs (when available) are listed below and have been reviewed.   {PULM QUESTIONNAIRES (Optional):33196}  ROS   Objective:   Vitals:    06/05/24 1337  BP: 124/80  Pulse: 90  Temp: (!) 97.1 F (36.2 C)  SpO2: 95%  Weight: 183 lb (83 kg)  Height: 4' 11.25 (1.505 m)   95% on *** LPM *** RA BMI Readings from Last 3 Encounters:  06/05/24 36.65 kg/m  05/07/21 39.01 kg/m  02/03/21 38.97 kg/m   Wt Readings from Last 3 Encounters:  06/05/24 183 lb (83 kg)  05/07/21 194 lb 12.8 oz (88.4 kg)  02/03/21 194 lb 9.6 oz (88.3 kg)    Physical Exam    Ancillary Information    Past Medical History:  Diagnosis Date   Allergy    Arthritis    knees   Chicken pox    Chronic bronchitis (HCC)    Diabetes mellitus without complication (HCC)    type 2   Hypertension    Phlebitis    Pulmonary embolism (HCC) 1990   after abdominal tumor removal   Vertigo  Family History  Problem Relation Age of Onset   Diabetes Mother    Cancer Father    Cancer Paternal Aunt    Cancer Paternal Uncle    Diabetes Paternal Grandmother      Past Surgical History:  Procedure Laterality Date   ABDOMINAL HYSTERECTOMY     abdominal tumor Left    size of grapefruit   COLONOSCOPY WITH PROPOFOL  N/A 09/24/2018   Procedure: COLONOSCOPY WITH BIOPSY;  Surgeon: Jinny Carmine, MD;  Location: [facility];  Service: Endoscopy;  Laterality: N/A;  diabetic - oral meds  requests arrival after 8:30   POLYPECTOMY N/A 09/24/2018   Procedure: POLYPECTOMY;  Surgeon: Jinny Carmine, MD;  Location: [facility];  Service: Endoscopy;  Laterality: N/A;   TONSILECTOMY, ADENOIDECTOMY, BILATERAL MYRINGOTOMY AND TUBES      Social History   Socioeconomic History   Marital status: Single    Spouse name: Not on file   Number of children: Not on file   Years of education: Not on file   Highest education level: Not on file  Occupational History   Not on file  Tobacco Use   Smoking status: Never   Smokeless tobacco: Never  Vaping Use   Vaping status: Never Used  Substance and Sexual Activity   Alcohol use: Yes    Comment:  rarely  - Holidays   Drug use: No   Sexual activity: Never  Other Topics Concern   Not on file  Social History Narrative   Not on file   Social Drivers of Health   Financial Resource Strain: Low Risk  (03/25/2021)   Overall Financial Resource Strain (CARDIA)    Difficulty of Paying Living Expenses: Not hard at all  Food Insecurity: No Food Insecurity (11/24/2022)   Received from [facility]   Hunger Vital Sign    Within the past 12 months, you worried that your food would run out before you got the money to buy more.: Never true    Within the past 12 months, the food you bought just didn't last and you didn't have money to get more.: Never true  Transportation Needs: No Transportation Needs (11/24/2022)   Received from [facility]   PRAPARE - Transportation    Lack of Transportation (Medical): No    Lack of Transportation (Non-Medical): No  Physical Activity: Unknown (08/10/2020)   Exercise Vital Sign    Days of Exercise per Week: 0 days    Minutes of Exercise per Session: Not on file  Stress: Stress Concern Present (03/25/2021)   [employer] of Occupational Health - Occupational Stress Questionnaire    Feeling of Stress : Rather much  Social Connections: Unknown (08/10/2020)   Social Connection and Isolation Panel    Frequency of Communication with Friends and Family: More than three times a week    Frequency of Social Gatherings with Friends and Family: More than three times a week    Attends Religious Services: Not on Marketing executive or Organizations: Not on file    Attends Banker Meetings: Not on file    Marital Status: Divorced  Intimate Partner Violence: Not on file     Allergies  Allergen Reactions   Oxycodone      Stops her breathing   Glipizide      Recurrent hypoglycemia   Lipitor [Atorvastatin ]     Affected balance   Penicillin G     Other reaction(s): Unknown     CBC  Component Value Date/Time   WBC 10.8 (H)  04/20/2021 1359   RBC 4.46 04/20/2021 1359   HGB 11.7 (L) 04/20/2021 1359   HCT 36.8 04/20/2021 1359   PLT 267 04/20/2021 1359   MCV 82.5 04/20/2021 1359   MCH 26.2 04/20/2021 1359   MCHC 31.8 04/20/2021 1359   RDW 15.2 04/20/2021 1359   LYMPHSABS 3.0 04/20/2021 1359   MONOABS 0.6 04/20/2021 1359   EOSABS 0.1 04/20/2021 1359   BASOSABS 0.0 04/20/2021 1359    Pulmonary Functions Testing Results:     No data to display          Outpatient Medications Prior to Visit  Medication Sig Dispense Refill   amLODipine  (NORVASC ) 2.5 MG tablet Take 2.5 mg by mouth daily.     atorvastatin  (LIPITOR) 20 MG tablet Take 1 tablet by mouth once daily 90 tablet 3   Cholecalciferol (VITAMIN D -3) 25 MCG (1000 UT) CAPS Take 1 capsule by mouth daily.     glipiZIDE  (GLUCOTROL  XL) 2.5 MG 24 hr tablet Take 2.5 mg by mouth daily.     glipiZIDE  (GLUCOTROL ) 5 MG tablet Take 5 mg by mouth daily.     glucose blood test strip Use as instructed to check sugars twice daily.  Uncontrolled diabetes mellitus E11.65 100 each 12   losartan  (COZAAR ) 50 MG tablet Take 1 tablet (50 mg total) by mouth daily. 90 tablet 1   meclizine  (ANTIVERT ) 25 MG tablet Take 1 tablet (25 mg total) by mouth 3 (three) times daily as needed for dizziness. 30 tablet 0   metFORMIN  (GLUCOPHAGE ) 500 MG tablet TAKE 1 TABLET BY MOUTH TWICE DAILY WITH A MEAL 180 tablet 0   Multiple Vitamins-Minerals (MULTIVITAMIN ADULT) CHEW Chew 1 each by mouth daily.     Omega-3 Fatty Acids (FISH OIL) 1000 MG CAPS Take 1 capsule by mouth daily.     ondansetron  (ZOFRAN ) 4 MG tablet Take 1 tablet (4 mg total) by mouth every 6 (six) hours. 12 tablet 0   predniSONE  (DELTASONE ) 10 MG tablet 6 tablets daily for 3 days, then reduce by 1 tablet daily until gone 33 tablet 0   warfarin (COUMADIN ) 5 MG tablet Take 5 mg by mouth daily.     amLODipine  (NORVASC ) 5 MG tablet Take 1 tablet (5 mg total) by mouth daily. 90 tablet 3   albuterol  (VENTOLIN  HFA) 108 (90 Base)  MCG/ACT inhaler Inhale 2 puffs into the lungs every 6 (six) hours as needed for wheezing or shortness of breath. (Patient not taking: Reported on 06/05/2024) 3.7 g 0   Cyanocobalamin  (B-12 COMPLIANCE INJECTION) 1000 MCG/ML KIT Inject 1,000 mcg as directed every 30 (thirty) days. (Patient not taking: Reported on 06/05/2024) 1 kit 5   ferrous sulfate 325 (65 FE) MG tablet Take 325 mg by mouth daily with breakfast. (Patient not taking: Reported on 06/05/2024)     glipiZIDE  (GLUCOTROL ) 5 MG tablet Take 5 mg by mouth as needed. (Patient not taking: Reported on 06/05/2024)     No facility-administered medications prior to visit.

## 2024-06-07 ENCOUNTER — Emergency Department
Admission: EM | Admit: 2024-06-07 | Discharge: 2024-06-08 | Disposition: A | Discharge status: Other Acute Inpatient Hospital | Payer: Medicare (Managed Care) | Attending: Emergency Medicine | Admitting: Emergency Medicine

## 2024-06-07 ENCOUNTER — Other Ambulatory Visit: Payer: Self-pay

## 2024-06-07 ENCOUNTER — Emergency Department: Payer: Medicare (Managed Care)

## 2024-06-07 DIAGNOSIS — Z7901 Long term (current) use of anticoagulants: Secondary | ICD-10-CM | POA: Diagnosis not present

## 2024-06-07 DIAGNOSIS — R55 Syncope and collapse: Secondary | ICD-10-CM | POA: Diagnosis not present

## 2024-06-07 DIAGNOSIS — R7989 Other specified abnormal findings of blood chemistry: Secondary | ICD-10-CM | POA: Insufficient documentation

## 2024-06-07 DIAGNOSIS — E119 Type 2 diabetes mellitus without complications: Secondary | ICD-10-CM | POA: Insufficient documentation

## 2024-06-07 DIAGNOSIS — I2609 Other pulmonary embolism with acute cor pulmonale: Secondary | ICD-10-CM | POA: Insufficient documentation

## 2024-06-07 DIAGNOSIS — R778 Other specified abnormalities of plasma proteins: Secondary | ICD-10-CM | POA: Diagnosis not present

## 2024-06-07 DIAGNOSIS — R0602 Shortness of breath: Secondary | ICD-10-CM | POA: Diagnosis present

## 2024-06-07 DIAGNOSIS — I10 Essential (primary) hypertension: Secondary | ICD-10-CM | POA: Insufficient documentation

## 2024-06-07 LAB — CBC
HCT: 36.1 % (ref 36.0–46.0)
Hemoglobin: 11.4 g/dL — ABNORMAL LOW (ref 12.0–15.0)
MCH: 26.5 pg (ref 26.0–34.0)
MCHC: 31.6 g/dL (ref 30.0–36.0)
MCV: 84 fL (ref 80.0–100.0)
Platelets: 241 K/uL (ref 150–400)
RBC: 4.3 MIL/uL (ref 3.87–5.11)
RDW: 15.9 % — ABNORMAL HIGH (ref 11.5–15.5)
WBC: 12.1 K/uL — ABNORMAL HIGH (ref 4.0–10.5)
nRBC: 0 % (ref 0.0–0.2)

## 2024-06-07 LAB — BASIC METABOLIC PANEL WITH GFR
Anion gap: 10 (ref 5–15)
BUN: 24 mg/dL — ABNORMAL HIGH (ref 8–23)
CO2: 21 mmol/L — ABNORMAL LOW (ref 22–32)
Calcium: 9.1 mg/dL (ref 8.9–10.3)
Chloride: 107 mmol/L (ref 98–111)
Creatinine, Ser: 1.36 mg/dL — ABNORMAL HIGH (ref 0.44–1.00)
GFR, Estimated: 41 mL/min — ABNORMAL LOW (ref >60)
Glucose, Bld: 181 mg/dL — ABNORMAL HIGH (ref 70–99)
Potassium: 4.3 mmol/L (ref 3.5–5.1)
Sodium: 138 mmol/L (ref 135–145)

## 2024-06-07 LAB — PROTIME-INR
INR: 1.3 — ABNORMAL HIGH (ref 0.8–1.2)
Prothrombin Time: 16.4 s — ABNORMAL HIGH (ref 11.4–15.2)

## 2024-06-07 LAB — TROPONIN I (HIGH SENSITIVITY): Troponin I (High Sensitivity): 25 ng/L — ABNORMAL HIGH (ref <18)

## 2024-06-07 LAB — BRAIN NATRIURETIC PEPTIDE: B Natriuretic Peptide: 454.5 pg/mL — ABNORMAL HIGH (ref 0.0–100.0)

## 2024-06-07 MED ORDER — HEPARIN (PORCINE) 25000 UT/250ML-% IV SOLN
1000.0000 [IU]/h | INTRAVENOUS | Status: DC
  Administered 2024-06-07: 1000 [IU]/h via INTRAVENOUS
  Filled 2024-06-07: qty 250

## 2024-06-07 MED ORDER — IOHEXOL 350 MG/ML SOLN
75.0000 mL | Freq: Once | INTRAVENOUS | Status: AC | PRN
  Administered 2024-06-07: 75 mL via INTRAVENOUS

## 2024-06-07 MED ORDER — HEPARIN BOLUS VIA INFUSION
3750.0000 [IU] | Freq: Once | INTRAVENOUS | Status: AC
  Administered 2024-06-07: 3750 [IU] via INTRAVENOUS
  Filled 2024-06-07: qty 3750

## 2024-06-07 NOTE — ED Provider Notes (Signed)
 [facility] Provider Note    Event Date/Time   First MD Initiated Contact with Patient 06/07/24 2152     (approximate)  History   Chief Complaint: Shortness of Breath  HPI  Jeanette Spencer is a 73 y.o. female with a past medical history of arthritis, diabetes, hypertension, PE, presents to the emergency department for shortness of breath and a syncopal episode.  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  Patient was on Coumadin  at that time for 6 years and then was ultimately taken off.  Patient states a little over a week ago she had a syncopal event ultimately diagnosed with a new PE while in [place] .  Patient was started on Coumadin  and was discharged home approximately 1 week ago.  Patient states she has had some shortness of breath ever since going home that has not really changed however tonight while walking from the bathroom back to her chair she began feeling very lightheaded dizzy and had a brief syncopal event after she sat down in her chair.  Did not fall to the ground.  No head injury.  Here patient appears well satting in the mid 90s on room air.  Speaking in full and complete sentences.  Patient denies any chest pain.  No fever.  Physical Exam   Triage Vital Signs: ED Triage Vitals  Encounter Vitals Group     BP 06/07/24 1951 (!) 154/87     Girls Systolic BP Percentile --      Girls Diastolic BP Percentile --      Boys Systolic BP Percentile --      Boys Diastolic BP Percentile --      Pulse Rate 06/07/24 1951 99     Resp 06/07/24 1951 18     Temp 06/07/24 1951 98.3 F (36.8 C)     Temp Source 06/07/24 1951 Oral     SpO2 06/07/24 1951 93 %     Weight 06/07/24 1947 182 lb 15.7 oz (83 kg)     Height 06/07/24 1947 4' 11 (1.499 m)     Head Circumference --      Peak Flow --      Pain Score 06/07/24 1947 0     Pain Loc --      Pain Education --       Exclude from Growth Chart --     Most recent vital signs: Vitals:   06/07/24 2152 06/07/24 2200  BP: (!) 154/87 (!) 163/104  Pulse: (!) 102 100  Resp: (!) 28 16  Temp:    SpO2: 94% 95%    General: Awake, no distress.  CV:  Good peripheral perfusion.  Regular rate and rhythm  Resp:  Normal effort.  Equal breath sounds bilaterally.  Abd:  No distention.  Soft, nontender.  No rebound or guarding. Other:  Lower extremity stockings in place.   ED Results / Procedures / Treatments   EKG  EKG viewed and interpreted by myself shows sinus tachycardia at 105 bpm with a narrow QRS, left axis deviation, largely normal intervals with nonspecific ST changes.  No ST elevation.  RADIOLOGY  I have reviewed and interpreted the chest x-ray images.  No consolidation seen on my evaluation. Radiology is read the x-ray is negative   MEDICATIONS ORDERED IN ED: Medications  iohexol  (OMNIPAQUE ) 350 MG/ML injection 75 mL (has no administration in time range)  IMPRESSION / MDM / ASSESSMENT AND PLAN / ED COURSE  I reviewed the triage vital signs and the nursing notes.  Patient's presentation is most consistent with acute presentation with potential threat to life or bodily function.  Patient presents to the emergency department after syncopal event.  Patient was recently diagnosed with a pulmonary embolism approximately 1 week ago and started on Coumadin .  Patient states she recently increased her Coumadin  dose due to a subtherapeutic level of 1.5 on Wednesday.  Patient's lab work so far shows a reassuring CBC, largely reassuring chemistry.  BNP of 450.  Troponin and INR are pending.  However given the patient's recent PE now with a syncopal episode tonight we will obtain a repeat CTA of the chest to rule out acute PE.  Patient had also just recently stood up from the toilet and got back to her chair before this happened and possibly more orthostatic symptoms given her more recently diagnosed PE.   Vital signs reassuring.  Physical exam reassuring.  Will reassess after CTA.  Patient will likely require 2 sets of cardiac enzymes.  Patient's workup shows a reassuring chemistry.  Reassuring CBC.  Troponin is very slightly elevated at 25 although not concerning Lycelle.  Will repeat a troponin as a precaution.  Patient's INR remains subtherapeutic at 1.3.  Patient states she recently saw her doctor on Wednesday was told that her INR was subtherapeutic and had her Coumadin  level increased from 5 mg daily to 7.5 mg daily.  Patient's CTA of the chest is pending.  Patient care signed out to oncoming provider.  FINAL CLINICAL IMPRESSION(S) / ED DIAGNOSES   Syncope    Note:  This document was prepared using Dragon voice recognition software and may include unintentional dictation errors.   Dorothyann Drivers, MD 06/07/24 2315

## 2024-06-07 NOTE — ED Triage Notes (Signed)
 Pt to ed from home via [facility] for SOB and near syncope. Pt has HX of same. Was seen at [facility] recently and was diagnosed with blood clots in her lungs. She had another episode tonight of same and called 911. Pt is caox4, in no acute distress and amble to stand and move from EMS stretcher to wheelchair.

## 2024-06-07 NOTE — ED Notes (Signed)
 Patient transported to CT

## 2024-06-07 NOTE — ED Provider Notes (Signed)
 ----------------------------------------- 11:03 PM on 06/07/2024 -----------------------------------------  Assuming care from Dr. Dorothyann.  In short, Jeanette Spencer is a 73 y.o. female with a chief complaint of syncope in the setting of recent PE diagnosis.  Refer to the original H&P for additional details.  The current plan of care is to follow up CTA and repeat troponin and reassess.   Clinical Course as of 06/08/24 0133  Fri Jun 07, 2024  2337 CT Angio Chest PE W and/or Wo Contrast I independently viewed and interpreted the patient's CTA chest.  There appears to be extensive clot burden.  The radiologist also called me and said that she was able to compare this scan to the 1 in [place] about a week ago and it seems worse on the right now with developing pleural effusion, worsening clot burden though no saddle embolus, right heart strain, and developing pulmonary infarctions.  I am ordering heparin  bolus plus infusion while determining the patient's eventual location for admission. [CF]  2344 Called Dr. Laurence with vascular surgery and left a message for him to call me back for a consult. [CF]  2349 Updated patient and son.  Her vital signs currently are stable with mild tachycardia at about 105, persistent mild hypertension, oxygen saturation of 97% on room air.  She is breathing comfortably and is in no distress as long as she is not ambulatory.  I explained to them the situation and the high probability that she would require transfer to another facility [facility]).  I will update them once I have heard back from vascular surgery. [CF]  Sat Jun 08, 2024  0000 Left another message for Dr. Laurence.  Given the high probability of need for transfer, also paged Dr. Karalee with IR at [facility].  This is based on the working draft of the code PE protocol for patient demonstrating intermediate high risk based on demonstrated right heart strain and elevated cardiac biomarkers. [CF]  9965 Paging  Dr. Karalee (IR) again [CF]  0041 Consulted with Dr. Karalee with IR.  He reviewed the patient's images and agrees that the patient should be at [facility] for probable intervention in the morning.  He agreed with the discussion with PCCM first (as opposed to the hospitalist service) given the frequency of syncopal episodes and the patient's age and comorbidities.  I am requesting that my secretary call [facility] to discuss transfer. [CF]  0103 I can wilted with Dr. Dub with the [facility] team.  We discussed the case and he accepted the patient for transfer.  Awaiting callback from [facility] with a bed assignment or report if there is going to be a substantial delay. [CF]  0129 Bed has been assigned.  I updated patient again and reassessed her.  She continues to breathe comfortably and is hemodynamically stable.  She agrees with the plan for transfer and understands that she may be a candidate for interventional radiology in the morning but that it is not a guarantee. [CF]    Clinical Course User Index [CF] Gordan Huxley, MD   .1-3 Lead EKG Interpretation  Performed by: Gordan Huxley, MD Authorized by: Gordan Huxley, MD     Interpretation: abnormal     ECG rate:  105   ECG rate assessment: tachycardic     Rhythm: sinus tachycardia     Ectopy: none     Conduction: normal   .Critical Care  Performed by: Gordan Huxley, MD Authorized by: Gordan Huxley, MD   Critical care provider statement:  Critical care time (minutes):  30   Critical care time was exclusive of:  Separately billable procedures and treating other patients   Critical care was necessary to treat or prevent imminent or life-threatening deterioration of the following conditions:  Circulatory failure (multiple PEs with cor pulmonale requiring heparin  infusion)   Critical care was time spent personally by me on the following activities:  Development of treatment plan with patient or surrogate, evaluation of patient's  response to treatment, examination of patient, obtaining history from patient or surrogate, ordering and performing treatments and interventions, ordering and review of laboratory studies, ordering and review of radiographic studies, pulse oximetry, re-evaluation of patient's condition and review of old charts    Medications  heparin  ADULT infusion 100 units/mL (25000 units/250mL) (1,000 Units/hr Intravenous New Bag/Given 06/07/24 2353)  iohexol  (OMNIPAQUE ) 350 MG/ML injection 75 mL (75 mLs Intravenous Contrast Given 06/07/24 2229)  heparin  bolus via infusion 3,750 Units (3,750 Units Intravenous Bolus from Bag 06/07/24 2354)     ED Discharge Orders     None      Final diagnoses:  Acute pulmonary embolism with acute cor pulmonale, unspecified pulmonary embolism type (HCC)  Syncope and collapse  Elevated troponin level  Elevated brain natriuretic peptide (BNP) level     Gordan Huxley, MD 06/08/24 [phone]

## 2024-06-08 ENCOUNTER — Inpatient Hospital Stay (HOSPITAL_COMMUNITY): Payer: Medicare (Managed Care)

## 2024-06-08 ENCOUNTER — Inpatient Hospital Stay (HOSPITAL_COMMUNITY)
Admission: EM | Admit: 2024-06-08 | Discharge: 2024-06-15 | DRG: 164 | Disposition: A | Discharge status: Home Health | Payer: Medicare (Managed Care) | Source: Other Acute Inpatient Hospital | Attending: Family Medicine | Admitting: Family Medicine

## 2024-06-08 ENCOUNTER — Encounter (HOSPITAL_COMMUNITY): Payer: Self-pay

## 2024-06-08 ENCOUNTER — Encounter (HOSPITAL_COMMUNITY): Payer: Medicare (Managed Care)

## 2024-06-08 DIAGNOSIS — I2609 Other pulmonary embolism with acute cor pulmonale: Secondary | ICD-10-CM | POA: Diagnosis present

## 2024-06-08 DIAGNOSIS — N1832 Chronic kidney disease, stage 3b: Secondary | ICD-10-CM | POA: Diagnosis present

## 2024-06-08 DIAGNOSIS — Z7401 Bed confinement status: Secondary | ICD-10-CM | POA: Diagnosis not present

## 2024-06-08 DIAGNOSIS — Z86711 Personal history of pulmonary embolism: Secondary | ICD-10-CM | POA: Diagnosis not present

## 2024-06-08 DIAGNOSIS — Z886 Allergy status to analgesic agent status: Secondary | ICD-10-CM

## 2024-06-08 DIAGNOSIS — J42 Unspecified chronic bronchitis: Secondary | ICD-10-CM | POA: Diagnosis present

## 2024-06-08 DIAGNOSIS — D519 Vitamin B12 deficiency anemia, unspecified: Secondary | ICD-10-CM | POA: Diagnosis present

## 2024-06-08 DIAGNOSIS — N183 Chronic kidney disease, stage 3 unspecified: Secondary | ICD-10-CM | POA: Diagnosis present

## 2024-06-08 DIAGNOSIS — E66812 Obesity, class 2: Secondary | ICD-10-CM | POA: Diagnosis present

## 2024-06-08 DIAGNOSIS — E1122 Type 2 diabetes mellitus with diabetic chronic kidney disease: Secondary | ICD-10-CM | POA: Diagnosis present

## 2024-06-08 DIAGNOSIS — Z6837 Body mass index (BMI) 37.0-37.9, adult: Secondary | ICD-10-CM | POA: Diagnosis not present

## 2024-06-08 DIAGNOSIS — Z833 Family history of diabetes mellitus: Secondary | ICD-10-CM | POA: Diagnosis not present

## 2024-06-08 DIAGNOSIS — D649 Anemia, unspecified: Secondary | ICD-10-CM | POA: Diagnosis not present

## 2024-06-08 DIAGNOSIS — Z7984 Long term (current) use of oral hypoglycemic drugs: Secondary | ICD-10-CM

## 2024-06-08 DIAGNOSIS — R0602 Shortness of breath: Secondary | ICD-10-CM | POA: Diagnosis not present

## 2024-06-08 DIAGNOSIS — Z7901 Long term (current) use of anticoagulants: Secondary | ICD-10-CM | POA: Diagnosis not present

## 2024-06-08 DIAGNOSIS — E785 Hyperlipidemia, unspecified: Secondary | ICD-10-CM | POA: Diagnosis present

## 2024-06-08 DIAGNOSIS — Z8672 Personal history of thrombophlebitis: Secondary | ICD-10-CM | POA: Diagnosis not present

## 2024-06-08 DIAGNOSIS — Z723 Lack of physical exercise: Secondary | ICD-10-CM | POA: Diagnosis not present

## 2024-06-08 DIAGNOSIS — E119 Type 2 diabetes mellitus without complications: Secondary | ICD-10-CM | POA: Diagnosis not present

## 2024-06-08 DIAGNOSIS — N1831 Chronic kidney disease, stage 3a: Secondary | ICD-10-CM | POA: Diagnosis not present

## 2024-06-08 DIAGNOSIS — Z880 Allergy status to penicillin: Secondary | ICD-10-CM | POA: Diagnosis not present

## 2024-06-08 DIAGNOSIS — I2699 Other pulmonary embolism without acute cor pulmonale: Secondary | ICD-10-CM | POA: Diagnosis present

## 2024-06-08 DIAGNOSIS — I3139 Other pericardial effusion (noninflammatory): Secondary | ICD-10-CM | POA: Diagnosis present

## 2024-06-08 DIAGNOSIS — I2602 Saddle embolus of pulmonary artery with acute cor pulmonale: Secondary | ICD-10-CM | POA: Diagnosis not present

## 2024-06-08 DIAGNOSIS — I129 Hypertensive chronic kidney disease with stage 1 through stage 4 chronic kidney disease, or unspecified chronic kidney disease: Secondary | ICD-10-CM | POA: Diagnosis present

## 2024-06-08 DIAGNOSIS — Z888 Allergy status to other drugs, medicaments and biological substances status: Secondary | ICD-10-CM | POA: Diagnosis not present

## 2024-06-08 DIAGNOSIS — E538 Deficiency of other specified B group vitamins: Secondary | ICD-10-CM | POA: Diagnosis present

## 2024-06-08 DIAGNOSIS — Z733 Stress, not elsewhere classified: Secondary | ICD-10-CM | POA: Diagnosis not present

## 2024-06-08 DIAGNOSIS — I10 Essential (primary) hypertension: Secondary | ICD-10-CM | POA: Diagnosis not present

## 2024-06-08 DIAGNOSIS — Z90710 Acquired absence of both cervix and uterus: Secondary | ICD-10-CM | POA: Diagnosis not present

## 2024-06-08 DIAGNOSIS — E669 Obesity, unspecified: Secondary | ICD-10-CM | POA: Diagnosis not present

## 2024-06-08 DIAGNOSIS — E1121 Type 2 diabetes mellitus with diabetic nephropathy: Secondary | ICD-10-CM | POA: Diagnosis present

## 2024-06-08 HISTORY — PX: IR ANGIOGRAM SELECTIVE EACH ADDITIONAL VESSEL: IMG667

## 2024-06-08 HISTORY — PX: IR THROMBECT PRIM MECH ADD (INCLU) MOD SED: IMG2298

## 2024-06-08 HISTORY — PX: IR US GUIDE VASC ACCESS RIGHT: IMG2390

## 2024-06-08 HISTORY — PX: IR THROMBECT PRIM MECH INIT (INCLU) MOD SED: IMG2297

## 2024-06-08 HISTORY — PX: IR ANGIOGRAM PULMONARY BILATERAL SELECTIVE: IMG664

## 2024-06-08 LAB — HEPARIN LEVEL (UNFRACTIONATED)
Heparin Unfractionated: 0.4 [IU]/mL (ref 0.30–0.70)
Heparin Unfractionated: 0.69 [IU]/mL (ref 0.30–0.70)

## 2024-06-08 LAB — CBC WITH DIFFERENTIAL/PLATELET
Abs Immature Granulocytes: 0.03 K/uL (ref 0.00–0.07)
Basophils Absolute: 0 K/uL (ref 0.0–0.1)
Basophils Relative: 0 %
Eosinophils Absolute: 0 K/uL (ref 0.0–0.5)
Eosinophils Relative: 0 %
HCT: 36.2 % (ref 36.0–46.0)
Hemoglobin: 11.4 g/dL — ABNORMAL LOW (ref 12.0–15.0)
Immature Granulocytes: 0 %
Lymphocytes Relative: 32 %
Lymphs Abs: 3.5 K/uL (ref 0.7–4.0)
MCH: 26.3 pg (ref 26.0–34.0)
MCHC: 31.5 g/dL (ref 30.0–36.0)
MCV: 83.4 fL (ref 80.0–100.0)
Monocytes Absolute: 0.6 K/uL (ref 0.1–1.0)
Monocytes Relative: 5 %
Neutro Abs: 7 K/uL (ref 1.7–7.7)
Neutrophils Relative %: 63 %
Platelets: 250 K/uL (ref 150–400)
RBC: 4.34 MIL/uL (ref 3.87–5.11)
RDW: 15.9 % — ABNORMAL HIGH (ref 11.5–15.5)
WBC: 11.2 K/uL — ABNORMAL HIGH (ref 4.0–10.5)
nRBC: 0 % (ref 0.0–0.2)

## 2024-06-08 LAB — GLUCOSE, CAPILLARY
Glucose-Capillary: 115 mg/dL — ABNORMAL HIGH (ref 70–99)
Glucose-Capillary: 129 mg/dL — ABNORMAL HIGH (ref 70–99)
Glucose-Capillary: 134 mg/dL — ABNORMAL HIGH (ref 70–99)
Glucose-Capillary: 141 mg/dL — ABNORMAL HIGH (ref 70–99)
Glucose-Capillary: 146 mg/dL — ABNORMAL HIGH (ref 70–99)
Glucose-Capillary: 178 mg/dL — ABNORMAL HIGH (ref 70–99)

## 2024-06-08 LAB — COMPREHENSIVE METABOLIC PANEL WITH GFR
ALT: 28 U/L (ref 0–44)
AST: 21 U/L (ref 15–41)
Albumin: 3.5 g/dL (ref 3.5–5.0)
Alkaline Phosphatase: 62 U/L (ref 38–126)
Anion gap: 12 (ref 5–15)
BUN: 18 mg/dL (ref 8–23)
CO2: 21 mmol/L — ABNORMAL LOW (ref 22–32)
Calcium: 9.4 mg/dL (ref 8.9–10.3)
Chloride: 108 mmol/L (ref 98–111)
Creatinine, Ser: 1.19 mg/dL — ABNORMAL HIGH (ref 0.44–1.00)
GFR, Estimated: 48 mL/min — ABNORMAL LOW (ref >60)
Glucose, Bld: 132 mg/dL — ABNORMAL HIGH (ref 70–99)
Potassium: 4.5 mmol/L (ref 3.5–5.1)
Sodium: 141 mmol/L (ref 135–145)
Total Bilirubin: 0.5 mg/dL (ref 0.0–1.2)
Total Protein: 7.1 g/dL (ref 6.5–8.1)

## 2024-06-08 LAB — HEMOGLOBIN A1C
Hgb A1c MFr Bld: 7.4 % — ABNORMAL HIGH (ref 4.8–5.6)
Mean Plasma Glucose: 165.68 mg/dL

## 2024-06-08 LAB — PHOSPHORUS: Phosphorus: 2.9 mg/dL (ref 2.5–4.6)

## 2024-06-08 LAB — ECHOCARDIOGRAM COMPLETE
Area-P 1/2: 2.84 cm2
Height: 59 in
S' Lateral: 2.5 cm
Weight: 2973.56 [oz_av]

## 2024-06-08 LAB — MRSA NEXT GEN BY PCR, NASAL: MRSA by PCR Next Gen: NOT DETECTED

## 2024-06-08 LAB — POCT ACTIVATED CLOTTING TIME
Activated Clotting Time: 153 s
Activated Clotting Time: 222 s

## 2024-06-08 LAB — MAGNESIUM: Magnesium: 1.9 mg/dL (ref 1.7–2.4)

## 2024-06-08 MED ORDER — FENTANYL CITRATE (PF) 100 MCG/2ML IJ SOLN
INTRAMUSCULAR | Status: AC | PRN
  Administered 2024-06-08: 50 ug via INTRAVENOUS
  Administered 2024-06-08: 25 ug via INTRAVENOUS

## 2024-06-08 MED ORDER — ORAL CARE MOUTH RINSE: 15.0000 mL | OROMUCOSAL | Status: DC | PRN

## 2024-06-08 MED ORDER — ONDANSETRON HCL 4 MG/2ML IJ SOLN
INTRAMUSCULAR | Status: AC
  Filled 2024-06-08: qty 2

## 2024-06-08 MED ORDER — MIDAZOLAM HCL 2 MG/2ML IJ SOLN
INTRAMUSCULAR | Status: AC
  Filled 2024-06-08: qty 4

## 2024-06-08 MED ORDER — LIDOCAINE HCL 1 % IJ SOLN
20.0000 mL | Freq: Once | INTRAMUSCULAR | Status: AC
  Administered 2024-06-08: 10 mL
  Filled 2024-06-08: qty 20

## 2024-06-08 MED ORDER — CHLORHEXIDINE GLUCONATE CLOTH 2 % EX PADS
6.0000 | MEDICATED_PAD | Freq: Every day | CUTANEOUS | Status: DC
  Administered 2024-06-08 – 2024-06-15 (×5): 6 via TOPICAL

## 2024-06-08 MED ORDER — DOCUSATE SODIUM 100 MG PO CAPS
100.0000 mg | ORAL_CAPSULE | Freq: Two times a day (BID) | ORAL | Status: DC
  Administered 2024-06-08 – 2024-06-15 (×12): 100 mg via ORAL
  Filled 2024-06-08 (×13): qty 1

## 2024-06-08 MED ORDER — HYDRALAZINE HCL 20 MG/ML IJ SOLN: 10.0000 mg | INTRAMUSCULAR | Status: DC | PRN

## 2024-06-08 MED ORDER — ONDANSETRON HCL 4 MG/2ML IJ SOLN
INTRAMUSCULAR | Status: AC | PRN
  Administered 2024-06-08 (×2): 4 mg via INTRAVENOUS

## 2024-06-08 MED ORDER — HEPARIN SODIUM (PORCINE) 1000 UNIT/ML IJ SOLN
INTRAMUSCULAR | Status: AC
  Filled 2024-06-08: qty 10

## 2024-06-08 MED ORDER — POLYETHYLENE GLYCOL 3350 17 G PO PACK: 17.0000 g | PACK | Freq: Every day | ORAL | Status: DC | PRN

## 2024-06-08 MED ORDER — HEPARIN SODIUM (PORCINE) 1000 UNIT/ML IJ SOLN
INTRAMUSCULAR | Status: AC | PRN
  Administered 2024-06-08 (×2): 3000 [IU] via INTRAVENOUS

## 2024-06-08 MED ORDER — IOHEXOL 300 MG/ML  SOLN
150.0000 mL | Freq: Once | INTRAMUSCULAR | Status: AC | PRN
  Administered 2024-06-08: 90 mL via INTRA_ARTERIAL

## 2024-06-08 MED ORDER — HEPARIN (PORCINE) 25000 UT/250ML-% IV SOLN
900.0000 [IU]/h | INTRAVENOUS | Status: AC
  Administered 2024-06-08: 1000 [IU]/h via INTRAVENOUS
  Filled 2024-06-08: qty 250

## 2024-06-08 MED ORDER — ATORVASTATIN CALCIUM 10 MG PO TABS
20.0000 mg | ORAL_TABLET | Freq: Every day | ORAL | Status: DC
  Administered 2024-06-08 – 2024-06-15 (×8): 20 mg via ORAL
  Filled 2024-06-08 (×8): qty 2

## 2024-06-08 MED ORDER — INSULIN ASPART 100 UNIT/ML IJ SOLN
2.0000 [IU] | INTRAMUSCULAR | Status: DC
  Administered 2024-06-08 (×2): 2 [IU] via SUBCUTANEOUS
  Administered 2024-06-08: 4 [IU] via SUBCUTANEOUS
  Administered 2024-06-08 – 2024-06-09 (×3): 2 [IU] via SUBCUTANEOUS
  Administered 2024-06-09: 4 [IU] via SUBCUTANEOUS
  Administered 2024-06-09: 2 [IU] via SUBCUTANEOUS
  Administered 2024-06-09: 4 [IU] via SUBCUTANEOUS
  Administered 2024-06-09 – 2024-06-10 (×4): 2 [IU] via SUBCUTANEOUS
  Administered 2024-06-11 (×2): 4 [IU] via SUBCUTANEOUS

## 2024-06-08 MED ORDER — FENTANYL CITRATE (PF) 100 MCG/2ML IJ SOLN
INTRAMUSCULAR | Status: AC
  Filled 2024-06-08: qty 4

## 2024-06-08 MED ORDER — MIDAZOLAM HCL 2 MG/2ML IJ SOLN
INTRAMUSCULAR | Status: AC | PRN
  Administered 2024-06-08 (×2): 1 mg via INTRAVENOUS

## 2024-06-08 MED ORDER — DOCUSATE SODIUM 100 MG PO CAPS: 100.0000 mg | ORAL_CAPSULE | Freq: Two times a day (BID) | ORAL | Status: DC | PRN

## 2024-06-08 MED ORDER — FENTANYL CITRATE (PF) 100 MCG/2ML IJ SOLN
INTRAMUSCULAR | Status: AC | PRN
  Administered 2024-06-08: 25 ug via INTRAVENOUS

## 2024-06-08 MED ORDER — LIDOCAINE HCL 1 % IJ SOLN
INTRAMUSCULAR | Status: AC
  Filled 2024-06-08: qty 20

## 2024-06-08 MED ORDER — ONDANSETRON HCL 4 MG/2ML IJ SOLN
INTRAMUSCULAR | Status: AC
Start: 2024-06-08 — End: 2024-06-08
  Filled 2024-06-08: qty 2

## 2024-06-08 NOTE — Sedation Documentation (Signed)
 ACT result 153, given to Dr.Hassell

## 2024-06-08 NOTE — Progress Notes (Signed)
 PHARMACY - ANTICOAGULATION CONSULT NOTE  Pharmacy Consult for Heparin   Indication: pulmonary embolus  Allergies  Allergen Reactions   Oxycodone      Stops her breathing   Glipizide      Recurrent hypoglycemia   Lipitor [Atorvastatin ]     Affected balance   Penicillin G     Other reaction(s): Unknown    Patient Measurements: Height: 4' 11 (149.9 cm) Weight: 84.3 kg (185 lb 13.6 oz) IBW/kg (Calculated) : 43.2 HEPARIN  DW (KG): 63.1  Vital Signs: Temp: 97.6 F (36.4 C) (09/13 0746) Temp Source: Oral (09/13 0746) BP: 154/83 (09/13 1105) Pulse Rate: 78 (09/13 1105)  Labs: Recent Labs    06/07/24 1959 06/07/24 2219 06/08/24 0323 06/08/24 0801  HGB 11.4*  --  11.4*  --   HCT 36.1  --  36.2  --   PLT 241  --  250  --   LABPROT  --  16.4*  --   --   INR  --  1.3*  --   --   HEPARINUNFRC  --   --   --  0.40  CREATININE 1.36*  --  1.19*  --   TROPONINIHS  --  25*  --   --     Estimated Creatinine Clearance: 39 mL/min (A) (by C-G formula based on SCr of 1.19 mg/dL (H)).   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  Pt presents with worsening clot burden,  right heart strain and bilateral PE.   MD requests to start heparin  drip + bolus.   9/12:  INR @ 2219 = 1.3 9/13 AM heparin  level therapeutic at 0.40 on 1000 units/hr. Currently in IR for venogram with possible Pulmonary Thrombectomy.   Goal of Therapy:  INR 2-3 Heparin  level 0.3-0.7 units/ml Monitor platelets by anticoagulation protocol: Yes   Plan:  Continue heparin  infusion at 1000 units/hr Check confirmatory anti-Xa level in 8 hours after IR  case and daily while on heparin  Continue to monitor H&H and platelets  - Warfarin not ordered.  Will need to F/U with provider regarding continuation of oral anticoagulation.   Rankin Sams, PharmD, BCPS, BCCCP Clinical Pharmacist

## 2024-06-08 NOTE — ED Notes (Signed)
 EMTALA reviewed by this RN.

## 2024-06-08 NOTE — Progress Notes (Signed)
 eLink Physician-Brief Progress Note Patient Name: Jeanette Spencer DOB: Apr 30, 1951 MRN: [id number]   Date of Service  06/08/2024  HPI/Events of Note  Patient transferred from outside hospital to [facility] for possible IR treatment of extensive and evolving pulmonary embolism, patient is currently hemodynamically stable.  eICU Interventions  New Patient Evaluation.        [name] 06/08/2024, 3:05 AM

## 2024-06-08 NOTE — H&P (Signed)
 NAME:  Jeanette Spencer, MRN:  [id number], DOB:  1951/08/22, LOS: 0 ADMISSION DATE:  06/08/2024, CONSULTATION DATE:  06/08/2024 REFERRING MD: Gordan Huxley, MD, CHIEF COMPLAINT:  syncope    History of Present Illness:  A 73 y.o. 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 She had similar presentation 05/31/2024 in [place] , admitted to hospital and dx with PE. She was discharged on Eliquis, but she was changed to Warfarin due to the cost. INR was subtherapeutic, the dose was increased from 5 mg daily to 7.5 mg daily per her PCP. Seen by Dr. Isadora 3 days ago. Patient states she has had some DOE ever since going home that has not really changed; however, tonight while walking from the bathroom back to her chair she began feeling very lightheaded, dizzy, and had a brief syncopal event after she sat down in her chair.  Did not fall to the ground.  No head or nack injury.  Here patient appears well satting in the mid 90s on RA. Patient denies any chest pain.  No fever/c/r, cough, wheezing. Felt nausea but no vomiting. Has mild LL edema, using compression stocking on/off for one year.   An echocardiogram was performed, while hospitalized in [place] , showed normal LV and RV function but with elevated RVSP of 75 mmHg. Patient denies any history of smoking.  She previously worked at a 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  06/07/2024 CTA PE:  1. Stable distribution and burden of extensive bilateral pulmonary emboli. Positive for acute PE with CT evidence of right heart strain (RV/LV Ratio = 1.3) consistent with at least submassive (intermediate risk) PE. The presence  of right heart strain has been associated with an increased risk of morbidity and mortality. 2. New small pericardial effusion. 3. New multifocal ground-glass opacities in the lungs may represent pulmonary infarcts. Infectious etiology not excluded. 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       No intake or output data in the 24 hours ending 06/08/24 0241 Filed Weights   06/08/24 0228  Weight: 84.3 kg    Examination: General: alert, oriented x4, and comfortable. On RA. SpO2 94%  HENT: PERL, normal pharynx and oral mucosa. No LNE or thyromegaly. No JVD Lungs: symmetrical air entry bilaterally. No crackles or wheezing Cardiovascular: NL S1/S2. No m/g/r Abdomen: no distension or tenderness Extremities: trace edema. Symmetrical  Neuro: nonfocal    Resolved problem list   Assessment and Plan  Submassive PE, right heart strain (RV/LV Ratio = 1.3), pulm infraction, and elevated Trop and BNP. Intermediate risk PE.  -Admit to ICU -  NPO -IV Heparin  protocol -Echo -Venous doppler scan for lower limbs -Consult IR -Repeat labs  CKD-3b -Avoid nephrotoxins -I/O chart -Monitor BMP  DM-2 -Glycemic control -Hold Metformin  -HbA1c  HTN -Hold Norvasc  for now -Hydralazine  PRN  Dyslipidemia -Resume Lipitor  Labs   CBC: Recent Labs  Lab 06/07/24 1959  WBC 12.1*  HGB 11.4*  HCT 36.1  MCV 84.0  PLT 241    Basic  Metabolic Panel: Recent Labs  Lab 06/07/24 1959  NA 138  K 4.3  CL 107  CO2 21*  GLUCOSE 181*  BUN 24*  CREATININE 1.36*  CALCIUM  9.1   GFR: Estimated Creatinine Clearance: 34.1 mL/min (A) (by C-G formula based on SCr of 1.36 mg/dL (H)). Recent Labs  Lab 06/07/24 1959  WBC 12.1*    Liver Function Tests: No results for input(s): AST, ALT, ALKPHOS, BILITOT, PROT, ALBUMIN in the last 168 hours. No results for input(s): LIPASE, AMYLASE in the last 168 hours. No results for input(s): AMMONIA in the last 168 hours.  ABG No results found for: PHART, PCO2ART, PO2ART, HCO3, TCO2, ACIDBASEDEF, O2SAT   Coagulation Profile: Recent Labs  Lab 06/07/24 2219  INR 1.3*    Cardiac Enzymes: No results for input(s): CKTOTAL, CKMB, CKMBINDEX, TROPONINI in the last 168 hours.  HbA1C: Hgb A1c MFr Bld  Date/Time Value Ref Range Status  02/01/2021 08:16 AM 8.2 (H) 4.6 - 6.5 % Final    Comment:    Glycemic Control Guidelines for People with Diabetes:Non Diabetic:  <6%Goal of Therapy: <7%Additional Action Suggested:  >8%   09/15/2020 08:39 AM 7.5 (H) 4.6 - 6.5 % Final    Comment:    Glycemic Control Guidelines for People with Diabetes:Non Diabetic:  <6%Goal of Therapy: <7%Additional Action Suggested:  >8%     CBG: No results for input(s): GLUCAP in the last 168 hours.  Review of Systems:   Review of Systems  Constitutional:  Negative for chills, diaphoresis, fever, malaise/fatigue and weight loss.  Respiratory:  Positive for shortness of breath. Negative for cough, hemoptysis, sputum production and wheezing.   Cardiovascular:  Positive for leg swelling. Negative for chest pain, palpitations, orthopnea, claudication and PND.  Gastrointestinal:  Positive for heartburn and nausea. Negative for abdominal pain, blood in stool, diarrhea, melena and vomiting.  Genitourinary:  Negative for dysuria.  Skin:  Negative for itching and rash.   Neurological:  Positive for dizziness and loss of consciousness. Negative for sensory change, speech change, focal weakness, seizures, weakness and headaches.     Past Medical History:  She,  has a past medical history of Allergy, Arthritis, Chicken pox, Chronic bronchitis (HCC), Diabetes mellitus without complication (HCC), Hypertension, Phlebitis, Pulmonary embolism (HCC) (1990), and Vertigo.   Surgical History:   Past Surgical History:  Procedure Laterality Date   ABDOMINAL HYSTERECTOMY     abdominal tumor Left    size of grapefruit   COLONOSCOPY WITH PROPOFOL  N/A 09/24/2018   Procedure: COLONOSCOPY WITH BIOPSY;  Surgeon: Jinny Carmine, MD;  Location: [facility];  Service: Endoscopy;  Laterality: N/A;  diabetic - oral meds  requests arrival after 8:30   POLYPECTOMY N/A 09/24/2018   Procedure: POLYPECTOMY;  Surgeon: Jinny Carmine, MD;  Location: [facility];  Service: Endoscopy;  Laterality: N/A;   TONSILECTOMY, ADENOIDECTOMY, BILATERAL MYRINGOTOMY AND TUBES       Social History:   reports that she has never smoked. She has never used smokeless tobacco. She reports current alcohol use. She reports that she does not use drugs.  Family History:  Her family history includes Cancer in her father, paternal aunt, and paternal uncle; Diabetes in her mother and paternal grandmother.   Allergies Allergies  Allergen Reactions   Oxycodone      Stops her breathing   Glipizide      Recurrent hypoglycemia   Lipitor [Atorvastatin ]     Affected balance   Penicillin G     Other reaction(s): Unknown     Home Medications  Prior to Admission medications   Medication Sig Start Date End Date Taking? Authorizing Provider  albuterol  (VENTOLIN  HFA) 108 (90 Base) MCG/ACT inhaler Inhale 2 puffs into the lungs every 6 (six) hours as needed for wheezing or shortness of breath. Patient not taking: Reported on 06/05/2024 05/07/21   Tullo, Teresa L, MD  amLODipine  (NORVASC ) 2.5 MG tablet  Take 2.5 mg by mouth daily. 06/28/22   [provider]  atorvastatin  (LIPITOR) 20 MG tablet Take 1 tablet by mouth once daily 01/25/21   Tullo, Teresa L, MD  Cholecalciferol (VITAMIN D -3) 25 MCG (1000 UT) CAPS Take 1 capsule by mouth daily. 10/04/23   [provider]  Cyanocobalamin  (B-12 COMPLIANCE INJECTION) 1000 MCG/ML KIT Inject 1,000 mcg as directed every 30 (thirty) days. Patient not taking: Reported on 06/05/2024 03/17/21   Rao, Archana C, MD  ferrous sulfate 325 (65 FE) MG tablet Take 325 mg by mouth daily with breakfast. Patient not taking: Reported on 06/05/2024    [provider]  glipiZIDE  (GLUCOTROL  XL) 2.5 MG 24 hr tablet Take 2.5 mg by mouth daily. 05/03/22   [provider]  glipiZIDE  (GLUCOTROL ) 5 MG tablet Take 5 mg by mouth daily. 10/04/23   [provider]  glucose blood test strip Use as instructed to check sugars twice daily.  Uncontrolled diabetes mellitus E11.65 09/07/15   Marylynn Verneita CROME, MD  losartan  (COZAAR ) 50 MG tablet Take 1 tablet (50 mg total) by mouth daily. 02/03/21   Marylynn Verneita CROME, MD  meclizine  (ANTIVERT ) 25 MG tablet Take 1 tablet (25 mg total) by mouth 3 (three) times daily as needed for dizziness. 05/18/21   Gwenn Kent, MD  metFORMIN  (GLUCOPHAGE ) 500 MG tablet TAKE 1 TABLET BY MOUTH TWICE DAILY WITH A MEAL 07/14/21   Marylynn Verneita CROME, MD  Multiple Vitamins-Minerals (MULTIVITAMIN ADULT) CHEW Chew 1 each by mouth daily.    [provider]  Omega-3 Fatty Acids (FISH OIL) 1000 MG CAPS Take 1 capsule by mouth daily. 10/04/23   [provider]  ondansetron  (ZOFRAN ) 4 MG tablet Take 1 tablet (4 mg total) by mouth every 6 (six) hours. 05/18/21   Gwenn Kent, MD  predniSONE  (DELTASONE ) 10 MG tablet 6 tablets daily for 3 days, then reduce by 1 tablet daily until gone 05/07/21   Tullo, Teresa L, MD  warfarin (COUMADIN ) 5 MG tablet Take 5 mg by mouth daily.    [provider]     Critical care time: 55  min    Mancel Ply, MD [facility] Pager: see AMION

## 2024-06-08 NOTE — Progress Notes (Addendum)
 PHARMACY - ANTICOAGULATION CONSULT NOTE  Pharmacy Consult for Heparin   Indication: pulmonary embolus  Allergies  Allergen Reactions   Oxycodone      Stops her breathing   Glipizide      Recurrent hypoglycemia   Lipitor [Atorvastatin ]     Affected balance   Penicillin G     Other reaction(s): Unknown    Patient Measurements: Height: 4' 11 (149.9 cm) Weight: 84.3 kg (185 lb 13.6 oz) IBW/kg (Calculated) : 43.2 HEPARIN  DW (KG): 63.1  Vital Signs: Temp: 97.9 F (36.6 C) (09/13 1937) Temp Source: Oral (09/13 1937) BP: 115/69 (09/13 1900) Pulse Rate: 78 (09/13 1900)  Labs: Recent Labs    06/07/24 1959 06/07/24 2219 06/08/24 0323 06/08/24 0801 06/08/24 2046  HGB 11.4*  --  11.4*  --   --   HCT 36.1  --  36.2  --   --   PLT 241  --  250  --   --   LABPROT  --  16.4*  --   --   --   INR  --  1.3*  --   --   --   HEPARINUNFRC  --   --   --  0.40 0.69  CREATININE 1.36*  --  1.19*  --   --   TROPONINIHS  --  25*  --   --   --     Estimated Creatinine Clearance: 39 mL/min (A) (by C-G formula based on SCr of 1.19 mg/dL (H)).   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  Pt presents with worsening clot burden,  right heart strain and bilateral PE.   MD requests to start heparin  drip + bolus.   9/12:  INR @ 2219 = 1.3 9/13 AM heparin  level therapeutic at 0.40 on 1000 units/hr. Currently in IR for venogram with possible Pulmonary Thrombectomy.   9/13 PM heparin  level 0.69 - remains in therapeutic range but trending up.  No overt bleeding or complications noted.  Goal of Therapy:  INR 2-3 Heparin  level  0.3-0.7 units/ml Monitor platelets by anticoagulation protocol: Yes   Plan:  Decrease IV heparin  to 950 units/hr to keep in therapeutic range. Recheck heparin  level with AM labs. Continue to monitor H&H and platelets   Harlene Barlow, Berd[employer], BCCP Clinical Pharmacist  06/08/2024 9:25 PM   [facility] pharmacy phone numbers are listed on [email or web address]

## 2024-06-08 NOTE — Consult Note (Signed)
 Chief Complaint:  Pulmonary Embolism  Procedure: Venogram with possible Pulmonary Thrombectomy   Referring Provider(s): Dr. MALVA Ply  Supervising Physician: Johann Sieving  Patient Status: [facility] - In-pt  History of Present Illness: Jeanette Spencer is a 73 y.o. 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 Patient reports she was on Coumadin  for 6 years following that PE. She was recently evaluated at a hospital in [place]  on 9/5 for a syncopal event and was diagnosed with a PE. She was started on Coumadin  and discharged home. Unfortunately, she presented to the ED last night for continued shortness of breath, dyspnea on exertion, dizziness and another syncopal event. Repeat CTA yesterday with extensive bilateral pulmonary emboli with evidence right heart strain (RV/LV ratio = 1.3). Simplified PESI with High risk. IR consulted for possible intervention. Case and images reviewed and approved by Dr. JONETTA Johann.  Patient currently resting in bed with her son at the bedside. States that she does not feel short of breath unless she is up and moving around. Also admits to some chest tightness and feeling as if she can't take a full breath at times. She is otherwise without complaints. She denies any palpitations, recent lower extremity swelling, or lightheadedness. NPO since midnight. All questions and concerns answered at the bedside.   Patient is Full Code  Past Medical History:  Diagnosis Date   Allergy    Arthritis    knees   Chicken pox    Chronic bronchitis (HCC)    Diabetes mellitus without complication (HCC)    type 2   Hypertension    Phlebitis    Pulmonary embolism (HCC) 1990   after abdominal tumor removal   Vertigo     Past Surgical History:  Procedure Laterality Date   ABDOMINAL HYSTERECTOMY     abdominal tumor Left    size of grapefruit   COLONOSCOPY WITH PROPOFOL  N/A 09/24/2018    Procedure: COLONOSCOPY WITH BIOPSY;  Surgeon: Jinny Carmine, MD;  Location: [facility];  Service: Endoscopy;  Laterality: N/A;  diabetic - oral meds  requests arrival after 8:30   POLYPECTOMY N/A 09/24/2018   Procedure: POLYPECTOMY;  Surgeon: Jinny Carmine, MD;  Location: [facility];  Service: Endoscopy;  Laterality: N/A;   TONSILECTOMY, ADENOIDECTOMY, BILATERAL MYRINGOTOMY AND TUBES      Allergies: Oxycodone , Glipizide , Lipitor [atorvastatin ], and Penicillin g  Medications: Prior to Admission medications   Medication Sig Start Date End Date Taking? Authorizing Provider  amLODipine  (NORVASC ) 2.5 MG tablet Take 2.5 mg by mouth daily. 06/28/22  Yes [provider]  atorvastatin  (LIPITOR) 20 MG tablet Take 1 tablet by mouth once daily 01/25/21  Yes Tullo, Teresa L, MD  Cholecalciferol (VITAMIN D -3) 25 MCG (1000 UT) CAPS Take 1 capsule by mouth daily. 10/04/23  Yes [provider]  glipiZIDE  (GLUCOTROL  XL) 2.5 MG 24 hr tablet Take 2.5 mg by mouth every evening. 05/03/22  Yes [provider]  glipiZIDE  (GLUCOTROL ) 5 MG tablet Take 5 mg by mouth daily before breakfast. 10/04/23  Yes [provider]  losartan  (COZAAR ) 50 MG tablet Take 1 tablet (50 mg total) by mouth daily. 02/03/21  Yes Marylynn Verneita CROME, MD  metFORMIN  (GLUCOPHAGE ) 500 MG tablet TAKE 1 TABLET BY MOUTH TWICE DAILY WITH A MEAL 07/14/21  Yes Marylynn Verneita CROME, MD  Omega-3 Fatty Acids (FISH OIL) 1000 MG CAPS Take 1 capsule by mouth daily. 10/04/23  Yes [provider]  warfarin (COUMADIN ) 5 MG tablet Take 5 mg by mouth daily.   Yes [provider]  albuterol  (VENTOLIN  HFA) 108 (90 Base) MCG/ACT inhaler Inhale 2 puffs into the lungs every 6 (six) hours as needed for wheezing or shortness of breath. Patient not taking: No sig reported 05/07/21   Marylynn Verneita CROME, MD  Cyanocobalamin  (B-12 COMPLIANCE INJECTION) 1000 MCG/ML KIT Inject 1,000 mcg as directed every 30 (thirty) days. Patient  not taking: No sig reported 03/17/21   Rao, Archana C, MD  ferrous sulfate 325 (65 FE) MG tablet Take 325 mg by mouth daily with breakfast. Patient not taking: No sig reported    [provider]  glucose blood test strip Use as instructed to check sugars twice daily.  Uncontrolled diabetes mellitus E11.65 09/07/15   Marylynn Verneita CROME, MD  meclizine  (ANTIVERT ) 25 MG tablet Take 1 tablet (25 mg total) by mouth 3 (three) times daily as needed for dizziness. Patient not taking: Reported on 06/08/2024 05/18/21   Gwenn Kent, MD  Multiple Vitamins-Minerals (MULTIVITAMIN ADULT) CHEW Chew 1 each by mouth daily. Patient not taking: Reported on 06/08/2024    [provider]  ondansetron  (ZOFRAN ) 4 MG tablet Take 1 tablet (4 mg total) by mouth every 6 (six) hours. Patient not taking: Reported on 06/08/2024 05/18/21   Gwenn Kent, MD  predniSONE  (DELTASONE ) 10 MG tablet 6 tablets daily for 3 days, then reduce by 1 tablet daily until gone Patient not taking: Reported on 06/08/2024 05/07/21   Marylynn Verneita CROME, MD     Family History  Problem Relation Age of Onset   Diabetes Mother    Cancer Father    Cancer Paternal Aunt    Cancer Paternal Uncle    Diabetes Paternal Grandmother     Social History   Socioeconomic History   Marital status: Single    Spouse name: Not on file   Number of children: Not on file   Years of education: Not on file   Highest education level: Not on file  Occupational History   Not on file  Tobacco Use   Smoking status: Never   Smokeless tobacco: Never  Vaping Use   Vaping status: Never Used  Substance and Sexual Activity   Alcohol use: Yes    Comment:  rarely - Holidays   Drug use: No   Sexual activity: Never  Other Topics Concern   Not on file  Social History Narrative   Not on file   Social Drivers of Health   Financial Resource Strain: Low Risk  (03/25/2021)   Overall Financial Resource Strain (CARDIA)    Difficulty of Paying Living  Expenses: Not hard at all  Food Insecurity: No Food Insecurity (11/24/2022)   Received from [facility]   Hunger Vital Sign    Within the past 12 months, you worried that your food would run out before you got the money to buy more.: Never true    Within the past 12 months, the food you bought just didn't last and you didn't have money to get more.: Never true  Transportation Needs: No Transportation Needs (11/24/2022)   Received from [facility]   PRAPARE - Transportation    Lack of Transportation (Medical): No    Lack of Transportation (Non-Medical): No  Physical Activity: Unknown (08/10/2020)   Exercise Vital Sign    Days of Exercise per Week: 0 days    Minutes of Exercise per Session: Not on file  Stress:  Stress Concern Present (03/25/2021)   [employer] of Occupational Health - Occupational Stress Questionnaire    Feeling of Stress : Rather much  Social Connections: Unknown (08/10/2020)   Social Connection and Isolation Panel    Frequency of Communication with Friends and Family: More than three times a week    Frequency of Social Gatherings with Friends and Family: More than three times a week    Attends Religious Services: Not on Marketing executive or Organizations: Not on file    Attends Banker Meetings: Not on file    Marital Status: Divorced     Review of Systems  Respiratory:  Positive for chest tightness and shortness of breath.   Patient denies any headache, abdominal pain, N/V, or fever/chills. All other systems are negative.   Vital Signs: BP 134/79   Pulse 85   Temp 97.6 F (36.4 C) (Oral)   Resp 20   Ht 4' 11 (1.499 m)   Wt 185 lb 13.6 oz (84.3 kg)   SpO2 98%   BMI 37.54 kg/m   Advance Care Plan: The advanced care plan/surrogate decision maker was discussed at the time of visit and documented in the medical record.    Physical Exam Constitutional:      Appearance: She is obese.  HENT:     Mouth/Throat:      Mouth: Mucous membranes are moist.     Pharynx: Oropharynx is clear.  Cardiovascular:     Rate and Rhythm: Normal rate and regular rhythm.     Pulses: Normal pulses.     Heart sounds: Normal heart sounds.  Pulmonary:     Effort: Pulmonary effort is normal.     Breath sounds: Normal breath sounds.  Abdominal:     General: Abdomen is flat.     Palpations: Abdomen is soft.     Tenderness: There is no abdominal tenderness.  Skin:    General: Skin is warm and dry.  Neurological:     Mental Status: She is alert and oriented to person, place, and time.  Psychiatric:        Behavior: Behavior normal.     Imaging: CT Angio Chest PE W and/or Wo Contrast Addendum Date: 06/07/2024 ADDENDUM REPORT: 06/07/2024 23:36 ADDENDUM: Right-sided thrombus in the distal right main pulmonary artery and right upper lobe has slightly increased compared to 05/30/2024. These results were called by telephone at the time of interpretation on 06/07/2024 at 11:36 pm to provider KEVIN PADUCHOWSKI , who verbally acknowledged these results. Electronically Signed   By: Greig Pique M.D.   On: 06/07/2024 23:36   Result Date: 06/07/2024 CLINICAL DATA:  Shortness of breath.  Near syncope. EXAM: CT ANGIOGRAPHY CHEST WITH CONTRAST TECHNIQUE: Multidetector CT imaging of the chest was performed using the standard protocol during bolus administration of intravenous contrast. Multiplanar CT image reconstructions and MIPs were obtained to evaluate the vascular anatomy. RADIATION DOSE REDUCTION: This exam was performed according to the departmental dose-optimization program which includes automated exposure control, adjustment of the mA and/or kV according to patient size and/or use of iterative reconstruction technique. CONTRAST:  75mL OMNIPAQUE  IOHEXOL  350 MG/ML SOLN COMPARISON:  Chest CT 05/30/2024 FINDINGS: Cardiovascular: There is adequate opacification of the pulmonary arteries to the segmental level. Again seen are extensive  bilateral pulmonary emboli within the distal main pulmonary arteries bilaterally extending into all lobes to the segmental level. Overall distribution and burden of thrombus has not significantly changed compared to 05/30/2024.  The heart is normal in size. There is a new small pericardial effusion. Aorta is normal in size. Mediastinum/Nodes: No enlarged mediastinal, hilar, or axillary lymph nodes. Thyroid  gland, trachea, and esophagus demonstrate no significant findings. Lungs/Pleura: There are multiple new areas of ground-glass opacities predominantly in the bilateral upper lobes and minimally in the lower lobes and right middle lobe. These may represent pulmonary infarcts. Infectious etiology not excluded. No pleural effusion or pneumothorax. Upper Abdomen: Left renal atrophy again noted. Musculoskeletal: No chest wall abnormality. No acute or significant osseous findings. Review of the MIP images confirms the above findings. IMPRESSION: 1. Stable distribution and burden of extensive bilateral pulmonary emboli. Positive for acute PE with CT evidence of right heart strain (RV/LV Ratio = 1.3) consistent with at least submassive (intermediate risk) PE. The presence of right heart strain has been associated with an increased risk of morbidity and mortality. 2. New small pericardial effusion. 3. New multifocal ground-glass opacities in the lungs may represent pulmonary infarcts. Infectious etiology not excluded. Electronically Signed: By: Greig Pique M.D. On: 06/07/2024 23:30   DG Chest 2 View Result Date: 06/07/2024 CLINICAL DATA:  Shortness of breath, history of pulmonary embolism EXAM: CHEST - 2 VIEW COMPARISON:  05/30/2024 FINDINGS: Cardiac shadow is within normal limits. Lungs are well aerated bilaterally. No focal infiltrate or effusion is seen. No bony abnormality is noted. IMPRESSION: No active cardiopulmonary disease. Electronically Signed   By: Oneil Devonshire M.D.   On: 06/07/2024 20:34     Labs:  CBC: Recent Labs    06/07/24 1959 06/08/24 0323  WBC 12.1* 11.2*  HGB 11.4* 11.4*  HCT 36.1 36.2  PLT 241 250    COAGS: Recent Labs    06/07/24 2219  INR 1.3*    BMP: Recent Labs    06/07/24 1959 06/08/24 0323  NA 138 141  K 4.3 4.5  CL 107 108  CO2 21* 21*  GLUCOSE 181* 132*  BUN 24* 18  CALCIUM  9.1 9.4  CREATININE 1.36* 1.19*  GFRNONAA 41* 48*    LIVER FUNCTION TESTS: Recent Labs    06/08/24 0323  BILITOT 0.5  AST 21  ALT 28  ALKPHOS 62  PROT 7.1  ALBUMIN 3.5    TUMOR MARKERS: No results for input(s): AFPTM, CEA, CA199, CHROMGRNA in the last 8760 hours.  Assessment and Plan:  Bilateral Pulmonary Embolism: Jeanette Spencer is a 73 y.o. female with a history of HTN, HLD, T2DM, CKD3, and PE previously on Coumadin . Recently diagnosed with new PE and restarted on Coumadin . She presented to the ED last night with continued symptoms and imaging with intermediate-high risk and slightly increased clot burden from previous imaging on 9/5. IR consulted for possible intervention. Case approved by Dr. JONETTA Faes. Procedure to be performed under moderate sedation.  -NPO since midnight -98% on RA, HR 85 -On IV heparin   -BUN/Cr 18/1.19 -Plan for PE thrombectomy in IR with Dr. JONETTA Faes   Risks and benefits of PE thrombectomy discussed with the patient including, but not limited to bleeding, pulmonary hemorrhage, perforation or dissection of cardiovascular structures, vascular injury, stroke, contrast induced renal failure, and infection.   Thank you for allowing our service to participate in Jeanette Spencer 's care.    Electronically Signed: Glennon CHRISTELLA Bal, PA-C   06/08/2024, 9:17 AM     I spent a total of 55 Miinutes in face to face in clinical consultation, greater than 50% of which was counseling/coordinating care for PE thrombectomy.

## 2024-06-08 NOTE — Progress Notes (Signed)
 PHARMACY - ANTICOAGULATION CONSULT NOTE  Pharmacy Consult for Heparin   Indication: pulmonary embolus  Allergies  Allergen Reactions   Oxycodone      Stops her breathing   Glipizide      Recurrent hypoglycemia   Lipitor [Atorvastatin ]     Affected balance   Penicillin G     Other reaction(s): Unknown    Patient Measurements: Height: 4' 11 (149.9 cm) Weight: 83 kg (182 lb 15.7 oz) IBW/kg (Calculated) : 43.2 HEPARIN  DW (KG): 62.7  Vital Signs: Temp: 97.7 F (36.5 C) (09/12 2357) Temp Source: Oral (09/12 2357) BP: 143/80 (09/12 2357) Pulse Rate: 104 (09/12 2357)  Labs: Recent Labs    06/07/24 1959 06/07/24 2219  HGB 11.4*  --   HCT 36.1  --   PLT 241  --   LABPROT  --  16.4*  INR  --  1.3*  CREATININE 1.36*  --   TROPONINIHS  --  25*    Estimated Creatinine Clearance: 33.9 mL/min (A) (by C-G formula based on SCr of 1.36 mg/dL (H)).   Medical History: Past Medical History:  Diagnosis Date   Allergy    Arthritis    knees   Chicken pox    Chronic bronchitis (HCC)    Diabetes mellitus without complication (HCC)    type 2   Hypertension    Phlebitis    Pulmonary embolism (HCC) 1990   after abdominal tumor removal   Vertigo     Medications:    Assessment: Pharmacy consulted to dose heparin  in this 73 year old female on warfarin PTA, subtherapeutic INR.  Pt presents with worsening clot burden,  right heart strain and bilateral PE.   MD requests to start heparin  drip + bolus.   9/12:  INR @ 2219 = 1.3  CrCl = 33.9 ml/min  Goal of Therapy:  INR 2-3 Heparin  level 0.3-0.7 units/ml Monitor platelets by anticoagulation protocol: Yes   Plan:  Give 3750 units bolus x 1 Start heparin  infusion at 1000 units/hr Check anti-Xa level in 8 hours and daily while on heparin  Continue to monitor H&H and platelets  - Warfarin not currently ordered.  Will need to F/U with provider regarding continuation of warfarin.   Maricella Filyaw D 06/08/2024,12:57 AM

## 2024-06-08 NOTE — Sedation Documentation (Signed)
 ACT 222, RESULT GIVEN TO DR.HASSELL

## 2024-06-08 NOTE — Sedation Documentation (Signed)
 PAP pressures  69/23 (40)

## 2024-06-08 NOTE — Progress Notes (Signed)
  Echocardiogram 2D Echocardiogram has been performed.  Tinnie FORBES Gosling RDCS 06/08/2024, 3:38 PM

## 2024-06-08 NOTE — Progress Notes (Signed)
 VASCULAR LAB    Bilateral lower extremity venous duplex has been performed.  See CV proc for preliminary results.   Quinesha Selinger, RVT 06/08/2024, 4:22 PM

## 2024-06-08 NOTE — Procedures (Signed)
  Procedure:  Bilat pulm arteriogram and thrombectomy   Preprocedure diagnosis: High intermediate risk PE Postprocedure diagnosis: same EBL:    minimal Complications:   none immediate  See full dictation in [employer].  CHARM Toribio Faes MD Main # [phone] Pager  [phone] Mobile [phone]

## 2024-06-09 DIAGNOSIS — E669 Obesity, unspecified: Secondary | ICD-10-CM

## 2024-06-09 DIAGNOSIS — D649 Anemia, unspecified: Secondary | ICD-10-CM | POA: Diagnosis not present

## 2024-06-09 DIAGNOSIS — E1122 Type 2 diabetes mellitus with diabetic chronic kidney disease: Secondary | ICD-10-CM | POA: Diagnosis not present

## 2024-06-09 DIAGNOSIS — I2602 Saddle embolus of pulmonary artery with acute cor pulmonale: Secondary | ICD-10-CM | POA: Diagnosis not present

## 2024-06-09 DIAGNOSIS — N1831 Chronic kidney disease, stage 3a: Secondary | ICD-10-CM

## 2024-06-09 LAB — IRON AND TIBC
Iron: 29 ug/dL (ref 28–170)
Saturation Ratios: 11 % (ref 10.4–31.8)
TIBC: 258 ug/dL (ref 250–450)
UIBC: 229 ug/dL

## 2024-06-09 LAB — CBC
HCT: 31.1 % — ABNORMAL LOW (ref 36.0–46.0)
Hemoglobin: 9.7 g/dL — ABNORMAL LOW (ref 12.0–15.0)
MCH: 26.6 pg (ref 26.0–34.0)
MCHC: 31.2 g/dL (ref 30.0–36.0)
MCV: 85.2 fL (ref 80.0–100.0)
Platelets: 218 K/uL (ref 150–400)
RBC: 3.65 MIL/uL — ABNORMAL LOW (ref 3.87–5.11)
RDW: 16.1 % — ABNORMAL HIGH (ref 11.5–15.5)
WBC: 11.9 K/uL — ABNORMAL HIGH (ref 4.0–10.5)
nRBC: 0 % (ref 0.0–0.2)

## 2024-06-09 LAB — GLUCOSE, CAPILLARY
Glucose-Capillary: 137 mg/dL — ABNORMAL HIGH (ref 70–99)
Glucose-Capillary: 138 mg/dL — ABNORMAL HIGH (ref 70–99)
Glucose-Capillary: 157 mg/dL — ABNORMAL HIGH (ref 70–99)
Glucose-Capillary: 151 mg/dL — ABNORMAL HIGH (ref 70–99)
Glucose-Capillary: 160 mg/dL — ABNORMAL HIGH (ref 70–99)
Glucose-Capillary: 111 mg/dL — ABNORMAL HIGH (ref 70–99)

## 2024-06-09 LAB — VITAMIN B12: Vitamin B-12: <150 pg/mL — ABNORMAL LOW (ref 180–914)

## 2024-06-09 LAB — RETICULOCYTES
Immature Retic Fract: 36.1 % — ABNORMAL HIGH (ref 2.3–15.9)
RBC.: 3.64 MIL/uL — ABNORMAL LOW (ref 3.87–5.11)
Retic Count, Absolute: 98.3 K/uL (ref 19.0–186.0)
Retic Ct Pct: 2.7 % (ref 0.4–3.1)

## 2024-06-09 LAB — FOLATE: Folate: 9 ng/mL (ref >5.9)

## 2024-06-09 LAB — FERRITIN: Ferritin: 25 ng/mL (ref 11–307)

## 2024-06-09 LAB — HEPARIN LEVEL (UNFRACTIONATED): Heparin Unfractionated: 0.69 [IU]/mL (ref 0.30–0.70)

## 2024-06-09 MED ORDER — CYANOCOBALAMIN 1000 MCG/ML IJ SOLN
1000.0000 ug | Freq: Once | INTRAMUSCULAR | Status: AC
  Administered 2024-06-09: 1000 ug via INTRAMUSCULAR
  Filled 2024-06-09: qty 1

## 2024-06-09 MED ORDER — WARFARIN SODIUM 5 MG PO TABS
5.0000 mg | ORAL_TABLET | Freq: Every day | ORAL | Status: DC
  Administered 2024-06-09 – 2024-06-11 (×3): 5 mg via ORAL
  Filled 2024-06-09 (×4): qty 1

## 2024-06-09 MED ORDER — WARFARIN - PHARMACIST DOSING INPATIENT: Freq: Every day | Status: DC

## 2024-06-09 MED ORDER — ENOXAPARIN SODIUM 80 MG/0.8ML IJ SOSY
80.0000 mg | PREFILLED_SYRINGE | Freq: Two times a day (BID) | INTRAMUSCULAR | Status: DC
  Administered 2024-06-09 – 2024-06-14 (×10): 80 mg via SUBCUTANEOUS
  Filled 2024-06-09 (×13): qty 0.8

## 2024-06-09 NOTE — Progress Notes (Signed)
 Referring Provider(s): Gordan Huxley  Supervising Physician: Johann Sieving  Patient Status:  [facility] - In-pt  Chief Complaint: Pulmonary embolism s/p thrombectomy  Brief History:  73 y.o. 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 Patient reports she was on Coumadin  for 6 years following that PE. She was recently evaluated at a hospital in [place]  on 9/5 for a syncopal event and was diagnosed with a PE. She was started on Coumadin  and discharged home. Unfortunately, she presented to the ED last night for continued shortness of breath, dyspnea on exertion, dizziness and another syncopal event. Repeat CTA yesterday with extensive bilateral pulmonary emboli with evidence right heart strain (RV/LV ratio = 1.3). Simplified PESI with High risk. IR consulted and patient underwent mechanical thrombectomy with Dr. JONETTA Johann on 9/13.   Subjective:  Patient resting in bed in no acute distress. Reports procedure went well yesterday and she has noticed a big improvement in her breathing. Denies any pain at her groin site.   Allergies: Oxycodone , Glipizide , Lipitor [atorvastatin ], and Penicillin g  Medications: Prior to Admission medications   Medication Sig Start Date End Date Taking? Authorizing Provider  amLODipine  (NORVASC ) 2.5 MG tablet Take 2.5 mg by mouth daily. 06/28/22  Yes [provider]  atorvastatin  (LIPITOR) 20 MG tablet Take 1 tablet by mouth once daily 01/25/21  Yes Tullo, Teresa L, MD  Cholecalciferol (VITAMIN D -3) 25 MCG (1000 UT) CAPS Take 1 capsule by mouth daily. 10/04/23  Yes [provider]  glipiZIDE  (GLUCOTROL  XL) 2.5 MG 24 hr tablet Take 2.5 mg by mouth every evening. 05/03/22  Yes [provider]  glipiZIDE  (GLUCOTROL ) 5 MG tablet Take 5 mg by mouth daily before breakfast. 10/04/23  Yes [provider]  losartan  (COZAAR ) 50 MG tablet Take 1 tablet (50 mg total) by mouth  daily. 02/03/21  Yes Marylynn Verneita CROME, MD  metFORMIN  (GLUCOPHAGE ) 500 MG tablet TAKE 1 TABLET BY MOUTH TWICE DAILY WITH A MEAL 07/14/21  Yes Marylynn Verneita CROME, MD  Omega-3 Fatty Acids (FISH OIL) 1000 MG CAPS Take 1 capsule by mouth daily. 10/04/23  Yes [provider]  warfarin (COUMADIN ) 5 MG tablet Take 5 mg by mouth daily.   Yes [provider]  albuterol  (VENTOLIN  HFA) 108 (90 Base) MCG/ACT inhaler Inhale 2 puffs into the lungs every 6 (six) hours as needed for wheezing or shortness of breath. Patient not taking: No sig reported 05/07/21   Marylynn Verneita CROME, MD  Cyanocobalamin  (B-12 COMPLIANCE INJECTION) 1000 MCG/ML KIT Inject 1,000 mcg as directed every 30 (thirty) days. Patient not taking: No sig reported 03/17/21   Rao, Archana C, MD  ferrous sulfate 325 (65 FE) MG tablet Take 325 mg by mouth daily with breakfast. Patient not taking: No sig reported    [provider]  glucose blood test strip Use as instructed to check sugars twice daily.  Uncontrolled diabetes mellitus E11.65 09/07/15   Marylynn Verneita CROME, MD  meclizine  (ANTIVERT ) 25 MG tablet Take 1 tablet (25 mg total) by mouth 3 (three) times daily as needed for dizziness. Patient not taking: Reported on 06/08/2024 05/18/21   Gwenn Kent, MD  Multiple Vitamins-Minerals (MULTIVITAMIN ADULT) CHEW Chew 1 each by mouth daily. Patient not taking: Reported on 06/08/2024    [provider]  ondansetron  (ZOFRAN ) 4 MG tablet Take 1 tablet (4 mg total) by mouth every 6 (six) hours. Patient not taking: Reported on  06/08/2024 05/18/21   Gwenn Kent, MD  predniSONE  (DELTASONE ) 10 MG tablet 6 tablets daily for 3 days, then reduce by 1 tablet daily until gone Patient not taking: Reported on 06/08/2024 05/07/21   Marylynn Verneita CROME, MD    Vital Signs: BP 127/86   Pulse 76   Temp 98.6 F (37 C) (Oral)   Resp 20   Ht 4' 11 (1.499 m)   Wt 185 lb 13.6 oz (84.3 kg)   SpO2 95%   BMI 37.54 kg/m   Physical  Exam Constitutional:      Appearance: She is obese.  Cardiovascular:     Rate and Rhythm: Normal rate and regular rhythm.     Comments: Inari device in place with overlying bandage. Small amount of dried blood underneath device. Appropriately tender to palpation around puncture site. No evidence of active bleeding, bruising, or hematoma.  Pulmonary:     Effort: Pulmonary effort is normal.  Neurological:     Mental Status: She is alert.     Labs:  CBC: Recent Labs    06/07/24 1959 06/08/24 0323 06/09/24 0212  WBC 12.1* 11.2* 11.9*  HGB 11.4* 11.4* 9.7*  HCT 36.1 36.2 31.1*  PLT 241 250 218    COAGS: Recent Labs    06/07/24 2219  INR 1.3*    BMP: Recent Labs    06/07/24 1959 06/08/24 0323  NA 138 141  K 4.3 4.5  CL 107 108  CO2 21* 21*  GLUCOSE 181* 132*  BUN 24* 18  CALCIUM  9.1 9.4  CREATININE 1.36* 1.19*  GFRNONAA 41* 48*    LIVER FUNCTION TESTS: Recent Labs    06/08/24 0323  BILITOT 0.5  AST 21  ALT 28  ALKPHOS 62  PROT 7.1  ALBUMIN 3.5    Assessment and Plan:  Bilateral Pulmonary Embolism: Jeanette Spencer is a 73 y.o. female with a history of pulmonary embolism in the 90s and subsequently placed on Coumadin  for 6 years. Patient recently found to have new PE and restarted on Coumadin . She presented to the ED on 9/12 with worsening shortness of breath. CTA showed interval enlargement of her clot burden in the past week. IR consulted for possible thrombectomy which patient underwent with Dr. Johann on 9/13.   -Patient doing well overall; reports improvement in breathing since procedure -96% on RA; HR 75 -Inari device removed without complication at the bedside; no evidence of bleeding. New dressing placed over puncture site  -Patient unsure of discharge plans at this time; per Medicine team  IR will sign off.   Should patient start to develop any worsening pain of her puncture site, bleeding, or bruising, please feel free to reach out to IR  for any questions.    Thank you for allowing our service to participate in Jeanette Spencer 's care.   Electronically Signed: Zilah Villaflor M Kadey Mihalic, PA-C 06/09/2024, 11:04 AM    I spent a total of 15 Minutes at the the patient's bedside AND on the patient's hospital floor or unit, greater than 50% of which was counseling/coordinating care for thrombectomy follow up.

## 2024-06-09 NOTE — Progress Notes (Addendum)
 PHARMACY - ANTICOAGULATION CONSULT NOTE  Pharmacy Consult for warfarin / lovenox    Indication: pulmonary embolus  Allergies  Allergen Reactions   Oxycodone      Stops her breathing   Glipizide      Recurrent hypoglycemia   Lipitor [Atorvastatin ]     Affected balance   Penicillin G     Other reaction(s): Unknown    Patient Measurements: Height: 4' 11 (149.9 cm) Weight: 84.3 kg (185 lb 13.6 oz) IBW/kg (Calculated) : 43.2 HEPARIN  DW (KG): 63.1  Vital Signs: Temp: 98.6 F (37 C) (09/14 0750) Temp Source: Oral (09/14 0750) BP: 128/64 (09/14 0800) Pulse Rate: 93 (09/14 0800)  Labs: Recent Labs    06/07/24 1959 06/07/24 2219 06/08/24 0323 06/08/24 0801 06/08/24 2046 06/09/24 0212  HGB 11.4*  --  11.4*  --   --  9.7*  HCT 36.1  --  36.2  --   --  31.1*  PLT 241  --  250  --   --  218  LABPROT  --  16.4*  --   --   --   --   INR  --  1.3*  --   --   --   --   HEPARINUNFRC  --   --   --  0.40 0.69 0.69  CREATININE 1.36*  --  1.19*  --   --   --   TROPONINIHS  --  25*  --   --   --   --     Estimated Creatinine Clearance: 39 mL/min (A) (by C-G formula based on SCr of 1.19 mg/dL (H)).   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  Pt presents with worsening clot burden,  right heart strain and bilateral PE.   MD requests to start heparin  drip + bolus.   9/12:  INR @ 2219 = 1.3 9/13 AM heparin  level therapeutic at 0.40 on 1000 units/hr. Currently in IR for venogram with possible Pulmonary Thrombectomy.  9/13 PM heparin  level 0.69 - remains in therapeutic range but trending up.  No overt bleeding or  complications noted. 9/14 AM heparin  level therapeutic at 0.69 at upper end of therapeutic range despite small dose decrease. Small drop in hb (9.7 from 11.4). No bleeding noted by RN, EBL in IR was minimal per note.  9/14 afternoon >> transition to therapeutic enoxaparin  / warfarin. Last INR on 9/12 @ 1.3.   Goal of Therapy:  INR 2-3 Heparin  level 0.3-0.7 units/ml Monitor platelets by anticoagulation protocol: Yes   Plan:  Heparin  infusion to stop at 18:00  Enoxaparin  to start when heparin  drip off, 80 mg q12h  Warfarin 5 mg daily Overlap of enoxaparin  / warfarin until 2x therapeutic INR and 5 days  Daily INR  Continue to monitor H&H and platelets  Rankin Sams, PharmD, BCPS, BCCCP Clinical Pharmacist

## 2024-06-09 NOTE — Progress Notes (Signed)
 Progress Note   Patient: Jeanette Spencer FMW:[id number] DOB: [birth year] DOA: 06/08/2024     1 DOS: the patient was seen and examined on 06/09/2024   Brief hospital course: 73 y.o. 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  Patient was found to have extensive bilateral pulmonary emboli with CT evidence of right heart strain.  Patient is admitted to ICU.  She had thrombectomy performed by IR, stable to be transferred to [facility] service 06/09/2024.  She is on heparin  drip per pharmacy protocol  Assessment and Plan: Bilateral extensive PE with right heart strain Status post thrombectomy 06/08/2024 Heparin  drip changed to Lovenox  and Coumadin  bridging.  Patient does not want to take Eliquis due to cost and renal dysfunction.  She feels safe on Coumadin  therapy. Plan to discharge with therapeutic INR. Venous Doppler lower extremities negative for DVT. Echocardiogram reviewed shows EF 60 to 65%, grade 1 diastolic dysfunction, right ventricular systolic function mildly reduced. Continue to monitor vitals closely.  Acute on chronic anemia: Patient's hemoglobin 9.7 today from 11.4 yesterday. She denies any active bleeding. Check anemia panel, FOBT. Continue to monitor H&H closely and transfuse for hemoglobin less than 7.  CKD stage III: Stable creatinine. Avoid nephrotoxic drugs. Continue to monitor daily renal function.  Type 2 diabetes mellitus: Hold oral hypoglycemics. Check A1c. Accu-Cheks, sliding scale insulin .  Hypertension: BP stable. Will resume Norvasc , losartan  from tomorrow.  Hyperlipidemia-continue statin  Obesity class II- BMI 37.54 Diet, exercise and weight reduction advised.     Out of bed to chair. Incentive  spirometry. Nursing supportive care. Fall, aspiration precautions. Diet:  Diet Orders (From admission, onward)     Start     Ordered   06/08/24 1406  Diet regular Room service appropriate? Yes; Fluid consistency: Thin  Diet effective now       Question Answer Comment  Room service appropriate? Yes   Fluid consistency: Thin      06/08/24 1406           DVT prophylaxis: SCDs Start: 06/08/24 0305 warfarin (COUMADIN ) tablet 5 mg  Level of care: ICU   Code Status: Full Code  Subjective: Patient is seen and examined today morning.  She is lying comfortably.  Denies chest pain or shortness of breath.  Did not get out of bed.  Eating fair.  Physical Exam: Vitals:   06/09/24 1100 06/09/24 1120 06/09/24 1200 06/09/24 1300  BP: 127/86  (!) 134/58 120/76  Pulse: 76  74 70  Resp: 20  13 16   Temp:  97.6 F (36.4 C)    TempSrc:  Oral    SpO2: 95%  97% 99%  Weight:      Height:        General - Elderly obese Caucasian female, no apparent distress HEENT - PERRLA, EOMI, atraumatic head, non tender sinuses. Lung - Clear, basal rales, rhonchi, wheezes. Heart - S1, S2 heard, no murmurs, rubs, trace pedal edema. Abdomen - Soft, non tender, bowel sounds good Neuro - Alert, awake and oriented x 3, non focal exam. Skin - Warm and dry.  Data Reviewed:      Latest Ref Rng & Units 06/09/2024    2:12 AM 06/08/2024    3:23 AM 06/07/2024    7:59 PM  CBC  WBC 4.0 - 10.5 K/uL 11.9  11.2  12.1   Hemoglobin 12.0 - 15.0 g/dL 9.7  88.5  88.5   Hematocrit 36.0 - 46.0 % 31.1  36.2  36.1   Platelets 150 - 400 K/uL 218  250  241       Latest Ref Rng & Units 06/08/2024    3:23 AM 06/07/2024    7:59 PM 02/19/2021   11:03 AM  BMP  Glucose 70 - 99 mg/dL 867  818  834   BUN 8 - 23 mg/dL 18  24  29    Creatinine 0.44 - 1.00 mg/dL 8.80  8.63  8.58   Sodium 135 - 145 mmol/L 141  138  140   Potassium 3.5 - 5.1 mmol/L 4.5  4.3  4.5   Chloride 98 - 111 mmol/L 108  107  104   CO2 22 - 32 mmol/L 21  21   28    Calcium  8.9 - 10.3 mg/dL 9.4  9.1  89.9    VAS US  LOWER EXTREMITY VENOUS (DVT) Result Date: 06/09/2024  Lower Venous DVT Study Patient Name:  Jeanette Spencer  Date of Exam:   06/08/2024 Medical Rec #: [id number]         Accession #:    [id number] Date of Birth: [birth year]         Patient Gender: F Patient Age:   50 years Exam Location:  [facility] Procedure:      VAS US  LOWER EXTREMITY VENOUS (DVT) Referring Phys: MANCEL PLY --------------------------------------------------------------------------------  Indications: Pulmonary embolism.  Limitations: Bandages (patient status post thrombectomy today), poor ultrasound/tissue interface and body habitus. Comparison Study: No prior study on file Performing Technologist: Alberta Lis RVS  Examination Guidelines: A complete evaluation includes B-mode imaging, spectral Doppler, color Doppler, and power Doppler as needed of all accessible portions of each vessel. Bilateral testing is considered an integral part of a complete examination. Limited examinations for reoccurring indications may be performed as noted. The reflux portion of the exam is performed with the patient in reverse Trendelenburg.  +---------+---------------+---------+-----------+---------------+--------------+ RIGHT    CompressibilityPhasicitySpontaneityProperties     Thrombus Aging +---------+---------------+---------+-----------+---------------+--------------+ CFV                                                        Not                                                                       visualized/ban                                                            dages          +---------+---------------+---------+-----------+---------------+--------------+ SFJ  Not                                                                       visualized/ban                                                             dages          +---------+---------------+---------+-----------+---------------+--------------+ FV Prox  Full           Yes      No         pulsatile                                                                 waveform                      +---------+---------------+---------+-----------+---------------+--------------+ FV Mid   Full                                                             +---------+---------------+---------+-----------+---------------+--------------+ FV DistalFull                                                             +---------+---------------+---------+-----------+---------------+--------------+ PFV      Full           Yes      No         pulsatile                                                                 waveform                      +---------+---------------+---------+-----------+---------------+--------------+ POP      Full           Yes      No                                       +---------+---------------+---------+-----------+---------------+--------------+ PTV      Full                                                             +---------+---------------+---------+-----------+---------------+--------------+  PERO     Full                                                             +---------+---------------+---------+-----------+---------------+--------------+   +---------+---------------+---------+-----------+---------------+--------------+ LEFT     CompressibilityPhasicitySpontaneityProperties     Thrombus Aging +---------+---------------+---------+-----------+---------------+--------------+ CFV      Full           Yes      No         pulsatile                                                                 waveform                      +---------+---------------+---------+-----------+---------------+--------------+ SFJ      Full                                                              +---------+---------------+---------+-----------+---------------+--------------+ FV Prox  Full           Yes      No         pulsatile                                                                 waveform                      +---------+---------------+---------+-----------+---------------+--------------+ FV Mid   Full           Yes      No         pulsatile                                                                 waveform                      +---------+---------------+---------+-----------+---------------+--------------+ FV DistalFull                                                             +---------+---------------+---------+-----------+---------------+--------------+ PFV      Full                                                             +---------+---------------+---------+-----------+---------------+--------------+  POP                     Yes      No         pulsatile                                                                 waveform                      +---------+---------------+---------+-----------+---------------+--------------+ PTV      Full                                                             +---------+---------------+---------+-----------+---------------+--------------+ PERO                                                       Not well                                                                  visualized     +---------+---------------+---------+-----------+---------------+--------------+     Summary: RIGHT: - There is no evidence of deep vein thrombosis in the lower extremity. However, portions of this examination were limited- see technologist comments above.  Pulsatile waveforms noted throughout  LEFT: - There is no evidence of deep vein thrombosis in the lower extremity. However, portions of this examination were limited- see technologist comments  above.  Pulsatile waveforms noted throughout.  *See table(s) above for measurements and observations. Electronically signed by Fonda Rim on 06/09/2024 at 9:16:04 AM.    Final    ECHOCARDIOGRAM COMPLETE Result Date: 06/08/2024    ECHOCARDIOGRAM REPORT   Patient Name:   Jeanette Spencer Date of Exam: 06/08/2024 Medical Rec #:  [id number]        Height:       59.0 in Accession #:    [id number]       Weight:       185.8 lb Date of Birth:  1950-11-08        BSA:          1.788 m Patient Age:    73 years         BP:           120/72 mmHg Patient Gender: F                HR:           82 bpm. Exam Location:  Inpatient Procedure: 2D Echo, Cardiac Doppler and Color Doppler (Both Spectral and Color            Flow Doppler were utilized during procedure). Indications:    Pulmonary Embolus I26.09  History:        Patient has no prior history of Echocardiogram examinations.                 Risk Factors:Diabetes and Hypertension.  Sonographer:    Tinnie Gosling RDCS Referring Phys: [id number] OMAR M ALBUSTAMI IMPRESSIONS  1. Left ventricular ejection fraction, by estimation, is 60 to 65%. The left ventricle has normal function. Left ventricular endocardial border not optimally defined to evaluate regional wall motion. There is mild left ventricular hypertrophy. Left ventricular diastolic parameters are consistent with Grade I diastolic dysfunction (impaired relaxation).  2. Right ventricular systolic function is mildly reduced. The right ventricular size is normal. Tricuspid regurgitation signal is inadequate for assessing PA pressure.  3. A small pericardial effusion is present.  4. The mitral valve is degenerative. Trivial mitral valve regurgitation. No evidence of mitral stenosis.  5. The aortic valve is grossly normal. There is mild calcification of the aortic valve. Aortic valve regurgitation is not visualized. No aortic stenosis is present.  6. The inferior vena cava is normal in size with <50% respiratory variability,  suggesting right atrial pressure of 8 mmHg. FINDINGS  Left Ventricle: Left ventricular ejection fraction, by estimation, is 60 to 65%. The left ventricle has normal function. Left ventricular endocardial border not optimally defined to evaluate regional wall motion. The left ventricular internal cavity size was normal in size. There is mild left ventricular hypertrophy. Left ventricular diastolic parameters are consistent with Grade I diastolic dysfunction (impaired relaxation). Right Ventricle: The right ventricular size is normal. No increase in right ventricular wall thickness. Right ventricular systolic function is mildly reduced. Tricuspid regurgitation signal is inadequate for assessing PA pressure. The tricuspid regurgitant velocity is 1.90 m/s, and with an assumed right atrial pressure of 8 mmHg, the estimated right ventricular systolic pressure is 22.4 mmHg. Left Atrium: Left atrial size was normal in size. Right Atrium: Right atrial size was normal in size. Pericardium: A small pericardial effusion is present. Mitral Valve: The mitral valve is degenerative in appearance. There is mild calcification of the mitral valve leaflet(s). Trivial mitral valve regurgitation. No evidence of mitral valve stenosis. Tricuspid Valve: The tricuspid valve is grossly normal. Tricuspid valve regurgitation is trivial. No evidence of tricuspid stenosis. Aortic Valve: The aortic valve is grossly normal. There is mild calcification of the aortic valve. Aortic valve regurgitation is not visualized. No aortic stenosis is present. Pulmonic Valve: The pulmonic valve was normal in structure. Pulmonic valve regurgitation is not visualized. No evidence of pulmonic stenosis. Aorta: The aortic root is normal in size and structure. Venous: The inferior vena cava is normal in size with less than 50% respiratory variability, suggesting right atrial pressure of 8 mmHg. IAS/Shunts: No atrial level shunt detected by color flow Doppler.  LEFT  VENTRICLE PLAX 2D LVIDd:         3.40 cm   Diastology LVIDs:         2.50 cm   LV e' medial:    6.31 cm/s LV PW:         1.30 cm   LV E/e' medial:  11.4 LV IVS:        1.30 cm   LV e' lateral:   8.16 cm/s LVOT diam:     2.10 cm   LV E/e' lateral: 8.8 LV SV:         66 LV SV Index:   37 LVOT Area:     3.46 cm  RIGHT VENTRICLE  IVC RV S prime:     9.68 cm/s  IVC diam: 2.00 cm TAPSE (M-mode): 1.7 cm LEFT ATRIUM             Index        RIGHT ATRIUM           Index LA diam:        3.70 cm 2.07 cm/m   RA Area:     14.70 cm LA Vol (A2C):   36.9 ml 20.64 ml/m  RA Volume:   36.60 ml  20.47 ml/m LA Vol (A4C):   32.7 ml 18.29 ml/m LA Biplane Vol: 35.8 ml 20.03 ml/m  AORTIC VALVE LVOT Vmax:   92.30 cm/s LVOT Vmean:  67.000 cm/s LVOT VTI:    0.191 m  AORTA Ao Root diam: 2.90 cm Ao Asc diam:  3.20 cm MITRAL VALVE               TRICUSPID VALVE MV Area (PHT): 2.84 cm    TR Peak grad:   14.4 mmHg MV Decel Time: 267 msec    TR Vmax:        190.00 cm/s MV E velocity: 71.80 cm/s MV A velocity: 99.20 cm/s  SHUNTS MV E/A ratio:  0.72        Systemic VTI:  0.19 m                            Systemic Diam: 2.10 cm Soyla Merck MD Electronically signed by Soyla Merck MD Signature Date/Time: 06/08/2024/4:56:52 PM    Final    IR THROMBECT PRIM MECH INIT (INCLU) MOD SED Result Date: 06/08/2024 INDICATION: new PE on Coumadin . continued symptoms and imaging with intermediate-high risk and slightly increased clot burden from previous imaging, elevated biomarkers, RV strain EXAM: BILATERAL PULMONARY ARTERIOGRAM LEFT PULMONARY ARTERIAL THROMBECTOMY RIGHT PULMONARY ARTERIAL THROMBECTOMY ULTRASOUND GUIDANCE FOR VASCULAR ACCESS RIGHT ILIAC VENOGRAM COMPARISON:  CTA from previous day MEDICATIONS: Lidocaine  1% subcutaneous, heparin  6,000 units ANESTHESIA/SEDATION: Intravenous Fentanyl  100mcg and Versed  2mg  were administered as conscious sedation during continuous monitoring of the patient's level of consciousness and  physiological / cardiorespiratory status by the radiology RN, with a total moderate sedation time of 90 minutes. Zofran  8 mg for nausea. TECHNIQUE: Informed written consent was obtained from the patient after a thorough discussion of the procedural risks, benefits and alternatives. All questions were addressed. Maximal Sterile Barrier Technique was utilized including caps, mask, sterile gowns, sterile gloves, sterile drape, hand hygiene and skin antiseptic. A timeout was performed prior to the initiation of the procedure. Patency and complete compressibility of right common femoral vein was confirmed with ultrasound. After surgical prep, and local lidocaine  administration, micropuncture access to right common femoral vein achieved. Outflow right iliac venogram obtained, demonstrating no significant DVT. Micrdilator exchanged over a Bentson wire for a 6 French angled pigtail catheter advanced into the right pulmonary artery. Pressure measurements obtained. Patient was heparinized with 2000 units heparin , and ACT was maintained greater than 200 with additional intermittent boluses as needed. Catheter exchanged for a 5 French vert catheter, negotiated into lower lobe branch right pulmonary artery, removed over a stiff Amplatz wire. Vascular sheath was upsized, and 24 [place] FlowTriever device advanced into right pulmonary artery. Suction thrombectomy performed. Follow-up selective right pulmonary arteriography was obtained. The catheter was then redirected to the origin of the left pulmonary artery for left pulmonary arteriography. Coaxial curved FlowTriever device advanced into distal left pulmonary artery. Suction mechanical  thrombectomy performed. Follow-up selective left pulmonary arteriogram obtained. Catheter was retracted into the distal main pulmonary artery for final pulmonary arteriography and pressure measurements. Guidewire, catheter and sheath were removed and hemostasis achieved with aid of 2-0 Ethilon  pursestring suture. The patient tolerated the procedure well. FLUOROSCOPY TIME:  Radiation Exposure Index (as provided by the fluoroscopic device): 169 mGy air Kerma COMPLICATIONS: None immediate. FINDINGS: Outflow right iliac venogram  show no  thrombus or occlusion. Initial right pulmonary arterial pressure69/23(40) mmHg. Selective right pulmonary arteriogram shows central partially occlusive clot extending into the intralobar branch and lower lobe segmental branches. After right pulmonary arterial selective suction thrombectomy, there is clearance of the central clot with some residual partially occlusive clot in peripheral segmental branches. Selective left pulmonary arteriogram demonstrates partially occlusive thrombus in the proximal left pulmonary artery . After left pulmonary artery selective suction thrombectomy, clearance of thrombus from the central left pulmonary artery with improved branch perfusion. Final pulmonary arterial pressure 53/18 (31)mmHg. IMPRESSION: 1. Negative for right iliac venous in a thrombus. 2. Bilateral central pulmonary emboli with markedly elevated pulmonary arterial pressures. 3. Good response to bilateral selective pulmonary arterial suction thrombectomy, with improved pulmonary arterial pressures. Electronically Signed   By: JONETTA Faes M.D.   On: 06/08/2024 14:46   IR US  Guide Vasc Access Right Result Date: 06/08/2024 INDICATION: new PE on Coumadin . continued symptoms and imaging with intermediate-high risk and slightly increased clot burden from previous imaging, elevated biomarkers, RV strain EXAM: BILATERAL PULMONARY ARTERIOGRAM LEFT PULMONARY ARTERIAL THROMBECTOMY RIGHT PULMONARY ARTERIAL THROMBECTOMY ULTRASOUND GUIDANCE FOR VASCULAR ACCESS RIGHT ILIAC VENOGRAM COMPARISON:  CTA from previous day MEDICATIONS: Lidocaine  1% subcutaneous, heparin  6,000 units ANESTHESIA/SEDATION: Intravenous Fentanyl  100mcg and Versed  2mg  were administered as conscious sedation during  continuous monitoring of the patient's level of consciousness and physiological / cardiorespiratory status by the radiology RN, with a total moderate sedation time of 90 minutes. Zofran  8 mg for nausea. TECHNIQUE: Informed written consent was obtained from the patient after a thorough discussion of the procedural risks, benefits and alternatives. All questions were addressed. Maximal Sterile Barrier Technique was utilized including caps, mask, sterile gowns, sterile gloves, sterile drape, hand hygiene and skin antiseptic. A timeout was performed prior to the initiation of the procedure. Patency and complete compressibility of right common femoral vein was confirmed with ultrasound. After surgical prep, and local lidocaine  administration, micropuncture access to right common femoral vein achieved. Outflow right iliac venogram obtained, demonstrating no significant DVT. Micrdilator exchanged over a Bentson wire for a 6 French angled pigtail catheter advanced into the right pulmonary artery. Pressure measurements obtained. Patient was heparinized with 2000 units heparin , and ACT was maintained greater than 200 with additional intermittent boluses as needed. Catheter exchanged for a 5 French vert catheter, negotiated into lower lobe branch right pulmonary artery, removed over a stiff Amplatz wire. Vascular sheath was upsized, and 24 [place] FlowTriever device advanced into right pulmonary artery. Suction thrombectomy performed. Follow-up selective right pulmonary arteriography was obtained. The catheter was then redirected to the origin of the left pulmonary artery for left pulmonary arteriography. Coaxial curved FlowTriever device advanced into distal left pulmonary artery. Suction mechanical thrombectomy performed. Follow-up selective left pulmonary arteriogram obtained. Catheter was retracted into the distal main pulmonary artery for final pulmonary arteriography and pressure measurements. Guidewire, catheter and  sheath were removed and hemostasis achieved with aid of 2-0 Ethilon pursestring suture. The patient tolerated the procedure well. FLUOROSCOPY TIME:  Radiation Exposure Index (as provided by the fluoroscopic device): 169  mGy air Kerma COMPLICATIONS: None immediate. FINDINGS: Outflow right iliac venogram  show no  thrombus or occlusion. Initial right pulmonary arterial pressure69/23(40) mmHg. Selective right pulmonary arteriogram shows central partially occlusive clot extending into the intralobar branch and lower lobe segmental branches. After right pulmonary arterial selective suction thrombectomy, there is clearance of the central clot with some residual partially occlusive clot in peripheral segmental branches. Selective left pulmonary arteriogram demonstrates partially occlusive thrombus in the proximal left pulmonary artery . After left pulmonary artery selective suction thrombectomy, clearance of thrombus from the central left pulmonary artery with improved branch perfusion. Final pulmonary arterial pressure 53/18 (31)mmHg. IMPRESSION: 1. Negative for right iliac venous in a thrombus. 2. Bilateral central pulmonary emboli with markedly elevated pulmonary arterial pressures. 3. Good response to bilateral selective pulmonary arterial suction thrombectomy, with improved pulmonary arterial pressures. Electronically Signed   By: JONETTA Faes M.D.   On: 06/08/2024 14:46   IR THROMBECT PRIM MECH ADD (INCLU) MOD SED Result Date: 06/08/2024 INDICATION: new PE on Coumadin . continued symptoms and imaging with intermediate-high risk and slightly increased clot burden from previous imaging, elevated biomarkers, RV strain EXAM: BILATERAL PULMONARY ARTERIOGRAM LEFT PULMONARY ARTERIAL THROMBECTOMY RIGHT PULMONARY ARTERIAL THROMBECTOMY ULTRASOUND GUIDANCE FOR VASCULAR ACCESS RIGHT ILIAC VENOGRAM COMPARISON:  CTA from previous day MEDICATIONS: Lidocaine  1% subcutaneous, heparin  6,000 units ANESTHESIA/SEDATION: Intravenous  Fentanyl  100mcg and Versed  2mg  were administered as conscious sedation during continuous monitoring of the patient's level of consciousness and physiological / cardiorespiratory status by the radiology RN, with a total moderate sedation time of 90 minutes. Zofran  8 mg for nausea. TECHNIQUE: Informed written consent was obtained from the patient after a thorough discussion of the procedural risks, benefits and alternatives. All questions were addressed. Maximal Sterile Barrier Technique was utilized including caps, mask, sterile gowns, sterile gloves, sterile drape, hand hygiene and skin antiseptic. A timeout was performed prior to the initiation of the procedure. Patency and complete compressibility of right common femoral vein was confirmed with ultrasound. After surgical prep, and local lidocaine  administration, micropuncture access to right common femoral vein achieved. Outflow right iliac venogram obtained, demonstrating no significant DVT. Micrdilator exchanged over a Bentson wire for a 6 French angled pigtail catheter advanced into the right pulmonary artery. Pressure measurements obtained. Patient was heparinized with 2000 units heparin , and ACT was maintained greater than 200 with additional intermittent boluses as needed. Catheter exchanged for a 5 French vert catheter, negotiated into lower lobe branch right pulmonary artery, removed over a stiff Amplatz wire. Vascular sheath was upsized, and 24 [place] FlowTriever device advanced into right pulmonary artery. Suction thrombectomy performed. Follow-up selective right pulmonary arteriography was obtained. The catheter was then redirected to the origin of the left pulmonary artery for left pulmonary arteriography. Coaxial curved FlowTriever device advanced into distal left pulmonary artery. Suction mechanical thrombectomy performed. Follow-up selective left pulmonary arteriogram obtained. Catheter was retracted into the distal main pulmonary artery for final  pulmonary arteriography and pressure measurements. Guidewire, catheter and sheath were removed and hemostasis achieved with aid of 2-0 Ethilon pursestring suture. The patient tolerated the procedure well. FLUOROSCOPY TIME:  Radiation Exposure Index (as provided by the fluoroscopic device): 169 mGy air Kerma COMPLICATIONS: None immediate. FINDINGS: Outflow right iliac venogram  show no  thrombus or occlusion. Initial right pulmonary arterial pressure69/23(40) mmHg. Selective right pulmonary arteriogram shows central partially occlusive clot extending into the intralobar branch and lower lobe segmental branches. After right pulmonary arterial selective suction thrombectomy, there is clearance of the central clot with  some residual partially occlusive clot in peripheral segmental branches. Selective left pulmonary arteriogram demonstrates partially occlusive thrombus in the proximal left pulmonary artery . After left pulmonary artery selective suction thrombectomy, clearance of thrombus from the central left pulmonary artery with improved branch perfusion. Final pulmonary arterial pressure 53/18 (31)mmHg. IMPRESSION: 1. Negative for right iliac venous in a thrombus. 2. Bilateral central pulmonary emboli with markedly elevated pulmonary arterial pressures. 3. Good response to bilateral selective pulmonary arterial suction thrombectomy, with improved pulmonary arterial pressures. Electronically Signed   By: JONETTA Faes M.D.   On: 06/08/2024 14:46   IR THROMBECT PRIM MECH ADD (INCLU) MOD SED Result Date: 06/08/2024 INDICATION: new PE on Coumadin . continued symptoms and imaging with intermediate-high risk and slightly increased clot burden from previous imaging, elevated biomarkers, RV strain EXAM: BILATERAL PULMONARY ARTERIOGRAM LEFT PULMONARY ARTERIAL THROMBECTOMY RIGHT PULMONARY ARTERIAL THROMBECTOMY ULTRASOUND GUIDANCE FOR VASCULAR ACCESS RIGHT ILIAC VENOGRAM COMPARISON:  CTA from previous day MEDICATIONS: Lidocaine   1% subcutaneous, heparin  6,000 units ANESTHESIA/SEDATION: Intravenous Fentanyl  100mcg and Versed  2mg  were administered as conscious sedation during continuous monitoring of the patient's level of consciousness and physiological / cardiorespiratory status by the radiology RN, with a total moderate sedation time of 90 minutes. Zofran  8 mg for nausea. TECHNIQUE: Informed written consent was obtained from the patient after a thorough discussion of the procedural risks, benefits and alternatives. All questions were addressed. Maximal Sterile Barrier Technique was utilized including caps, mask, sterile gowns, sterile gloves, sterile drape, hand hygiene and skin antiseptic. A timeout was performed prior to the initiation of the procedure. Patency and complete compressibility of right common femoral vein was confirmed with ultrasound. After surgical prep, and local lidocaine  administration, micropuncture access to right common femoral vein achieved. Outflow right iliac venogram obtained, demonstrating no significant DVT. Micrdilator exchanged over a Bentson wire for a 6 French angled pigtail catheter advanced into the right pulmonary artery. Pressure measurements obtained. Patient was heparinized with 2000 units heparin , and ACT was maintained greater than 200 with additional intermittent boluses as needed. Catheter exchanged for a 5 French vert catheter, negotiated into lower lobe branch right pulmonary artery, removed over a stiff Amplatz wire. Vascular sheath was upsized, and 24 [place] FlowTriever device advanced into right pulmonary artery. Suction thrombectomy performed. Follow-up selective right pulmonary arteriography was obtained. The catheter was then redirected to the origin of the left pulmonary artery for left pulmonary arteriography. Coaxial curved FlowTriever device advanced into distal left pulmonary artery. Suction mechanical thrombectomy performed. Follow-up selective left pulmonary arteriogram obtained.  Catheter was retracted into the distal main pulmonary artery for final pulmonary arteriography and pressure measurements. Guidewire, catheter and sheath were removed and hemostasis achieved with aid of 2-0 Ethilon pursestring suture. The patient tolerated the procedure well. FLUOROSCOPY TIME:  Radiation Exposure Index (as provided by the fluoroscopic device): 169 mGy air Kerma COMPLICATIONS: None immediate. FINDINGS: Outflow right iliac venogram  show no  thrombus or occlusion. Initial right pulmonary arterial pressure69/23(40) mmHg. Selective right pulmonary arteriogram shows central partially occlusive clot extending into the intralobar branch and lower lobe segmental branches. After right pulmonary arterial selective suction thrombectomy, there is clearance of the central clot with some residual partially occlusive clot in peripheral segmental branches. Selective left pulmonary arteriogram demonstrates partially occlusive thrombus in the proximal left pulmonary artery . After left pulmonary artery selective suction thrombectomy, clearance of thrombus from the central left pulmonary artery with improved branch perfusion. Final pulmonary arterial pressure 53/18 (31)mmHg. IMPRESSION: 1. Negative for right iliac venous in  a thrombus. 2. Bilateral central pulmonary emboli with markedly elevated pulmonary arterial pressures. 3. Good response to bilateral selective pulmonary arterial suction thrombectomy, with improved pulmonary arterial pressures. Electronically Signed   By: JONETTA Faes M.D.   On: 06/08/2024 14:46   IR Angiogram Pulmonary Bilateral Selective Result Date: 06/08/2024 INDICATION: new PE on Coumadin . continued symptoms and imaging with intermediate-high risk and slightly increased clot burden from previous imaging, elevated biomarkers, RV strain EXAM: BILATERAL PULMONARY ARTERIOGRAM LEFT PULMONARY ARTERIAL THROMBECTOMY RIGHT PULMONARY ARTERIAL THROMBECTOMY ULTRASOUND GUIDANCE FOR VASCULAR ACCESS RIGHT  ILIAC VENOGRAM COMPARISON:  CTA from previous day MEDICATIONS: Lidocaine  1% subcutaneous, heparin  6,000 units ANESTHESIA/SEDATION: Intravenous Fentanyl  100mcg and Versed  2mg  were administered as conscious sedation during continuous monitoring of the patient's level of consciousness and physiological / cardiorespiratory status by the radiology RN, with a total moderate sedation time of 90 minutes. Zofran  8 mg for nausea. TECHNIQUE: Informed written consent was obtained from the patient after a thorough discussion of the procedural risks, benefits and alternatives. All questions were addressed. Maximal Sterile Barrier Technique was utilized including caps, mask, sterile gowns, sterile gloves, sterile drape, hand hygiene and skin antiseptic. A timeout was performed prior to the initiation of the procedure. Patency and complete compressibility of right common femoral vein was confirmed with ultrasound. After surgical prep, and local lidocaine  administration, micropuncture access to right common femoral vein achieved. Outflow right iliac venogram obtained, demonstrating no significant DVT. Micrdilator exchanged over a Bentson wire for a 6 French angled pigtail catheter advanced into the right pulmonary artery. Pressure measurements obtained. Patient was heparinized with 2000 units heparin , and ACT was maintained greater than 200 with additional intermittent boluses as needed. Catheter exchanged for a 5 French vert catheter, negotiated into lower lobe branch right pulmonary artery, removed over a stiff Amplatz wire. Vascular sheath was upsized, and 24 [place] FlowTriever device advanced into right pulmonary artery. Suction thrombectomy performed. Follow-up selective right pulmonary arteriography was obtained. The catheter was then redirected to the origin of the left pulmonary artery for left pulmonary arteriography. Coaxial curved FlowTriever device advanced into distal left pulmonary artery. Suction mechanical  thrombectomy performed. Follow-up selective left pulmonary arteriogram obtained. Catheter was retracted into the distal main pulmonary artery for final pulmonary arteriography and pressure measurements. Guidewire, catheter and sheath were removed and hemostasis achieved with aid of 2-0 Ethilon pursestring suture. The patient tolerated the procedure well. FLUOROSCOPY TIME:  Radiation Exposure Index (as provided by the fluoroscopic device): 169 mGy air Kerma COMPLICATIONS: None immediate. FINDINGS: Outflow right iliac venogram  show no  thrombus or occlusion. Initial right pulmonary arterial pressure69/23(40) mmHg. Selective right pulmonary arteriogram shows central partially occlusive clot extending into the intralobar branch and lower lobe segmental branches. After right pulmonary arterial selective suction thrombectomy, there is clearance of the central clot with some residual partially occlusive clot in peripheral segmental branches. Selective left pulmonary arteriogram demonstrates partially occlusive thrombus in the proximal left pulmonary artery . After left pulmonary artery selective suction thrombectomy, clearance of thrombus from the central left pulmonary artery with improved branch perfusion. Final pulmonary arterial pressure 53/18 (31)mmHg. IMPRESSION: 1. Negative for right iliac venous in a thrombus. 2. Bilateral central pulmonary emboli with markedly elevated pulmonary arterial pressures. 3. Good response to bilateral selective pulmonary arterial suction thrombectomy, with improved pulmonary arterial pressures. Electronically Signed   By: JONETTA Faes M.D.   On: 06/08/2024 14:46   IR Angiogram Selective Each Additional Vessel Result Date: 06/08/2024 INDICATION: new PE on Coumadin . continued symptoms and  imaging with intermediate-high risk and slightly increased clot burden from previous imaging, elevated biomarkers, RV strain EXAM: BILATERAL PULMONARY ARTERIOGRAM LEFT PULMONARY ARTERIAL THROMBECTOMY  RIGHT PULMONARY ARTERIAL THROMBECTOMY ULTRASOUND GUIDANCE FOR VASCULAR ACCESS RIGHT ILIAC VENOGRAM COMPARISON:  CTA from previous day MEDICATIONS: Lidocaine  1% subcutaneous, heparin  6,000 units ANESTHESIA/SEDATION: Intravenous Fentanyl  100mcg and Versed  2mg  were administered as conscious sedation during continuous monitoring of the patient's level of consciousness and physiological / cardiorespiratory status by the radiology RN, with a total moderate sedation time of 90 minutes. Zofran  8 mg for nausea. TECHNIQUE: Informed written consent was obtained from the patient after a thorough discussion of the procedural risks, benefits and alternatives. All questions were addressed. Maximal Sterile Barrier Technique was utilized including caps, mask, sterile gowns, sterile gloves, sterile drape, hand hygiene and skin antiseptic. A timeout was performed prior to the initiation of the procedure. Patency and complete compressibility of right common femoral vein was confirmed with ultrasound. After surgical prep, and local lidocaine  administration, micropuncture access to right common femoral vein achieved. Outflow right iliac venogram obtained, demonstrating no significant DVT. Micrdilator exchanged over a Bentson wire for a 6 French angled pigtail catheter advanced into the right pulmonary artery. Pressure measurements obtained. Patient was heparinized with 2000 units heparin , and ACT was maintained greater than 200 with additional intermittent boluses as needed. Catheter exchanged for a 5 French vert catheter, negotiated into lower lobe branch right pulmonary artery, removed over a stiff Amplatz wire. Vascular sheath was upsized, and 24 [place] FlowTriever device advanced into right pulmonary artery. Suction thrombectomy performed. Follow-up selective right pulmonary arteriography was obtained. The catheter was then redirected to the origin of the left pulmonary artery for left pulmonary arteriography. Coaxial curved  FlowTriever device advanced into distal left pulmonary artery. Suction mechanical thrombectomy performed. Follow-up selective left pulmonary arteriogram obtained. Catheter was retracted into the distal main pulmonary artery for final pulmonary arteriography and pressure measurements. Guidewire, catheter and sheath were removed and hemostasis achieved with aid of 2-0 Ethilon pursestring suture. The patient tolerated the procedure well. FLUOROSCOPY TIME:  Radiation Exposure Index (as provided by the fluoroscopic device): 169 mGy air Kerma COMPLICATIONS: None immediate. FINDINGS: Outflow right iliac venogram  show no  thrombus or occlusion. Initial right pulmonary arterial pressure69/23(40) mmHg. Selective right pulmonary arteriogram shows central partially occlusive clot extending into the intralobar branch and lower lobe segmental branches. After right pulmonary arterial selective suction thrombectomy, there is clearance of the central clot with some residual partially occlusive clot in peripheral segmental branches. Selective left pulmonary arteriogram demonstrates partially occlusive thrombus in the proximal left pulmonary artery . After left pulmonary artery selective suction thrombectomy, clearance of thrombus from the central left pulmonary artery with improved branch perfusion. Final pulmonary arterial pressure 53/18 (31)mmHg. IMPRESSION: 1. Negative for right iliac venous in a thrombus. 2. Bilateral central pulmonary emboli with markedly elevated pulmonary arterial pressures. 3. Good response to bilateral selective pulmonary arterial suction thrombectomy, with improved pulmonary arterial pressures. Electronically Signed   By: JONETTA Faes M.D.   On: 06/08/2024 14:46   IR Angiogram Selective Each Additional Vessel Result Date: 06/08/2024 INDICATION: new PE on Coumadin . continued symptoms and imaging with intermediate-high risk and slightly increased clot burden from previous imaging, elevated biomarkers, RV  strain EXAM: BILATERAL PULMONARY ARTERIOGRAM LEFT PULMONARY ARTERIAL THROMBECTOMY RIGHT PULMONARY ARTERIAL THROMBECTOMY ULTRASOUND GUIDANCE FOR VASCULAR ACCESS RIGHT ILIAC VENOGRAM COMPARISON:  CTA from previous day MEDICATIONS: Lidocaine  1% subcutaneous, heparin  6,000 units ANESTHESIA/SEDATION: Intravenous Fentanyl  100mcg and Versed  2mg  were administered as  conscious sedation during continuous monitoring of the patient's level of consciousness and physiological / cardiorespiratory status by the radiology RN, with a total moderate sedation time of 90 minutes. Zofran  8 mg for nausea. TECHNIQUE: Informed written consent was obtained from the patient after a thorough discussion of the procedural risks, benefits and alternatives. All questions were addressed. Maximal Sterile Barrier Technique was utilized including caps, mask, sterile gowns, sterile gloves, sterile drape, hand hygiene and skin antiseptic. A timeout was performed prior to the initiation of the procedure. Patency and complete compressibility of right common femoral vein was confirmed with ultrasound. After surgical prep, and local lidocaine  administration, micropuncture access to right common femoral vein achieved. Outflow right iliac venogram obtained, demonstrating no significant DVT. Micrdilator exchanged over a Bentson wire for a 6 French angled pigtail catheter advanced into the right pulmonary artery. Pressure measurements obtained. Patient was heparinized with 2000 units heparin , and ACT was maintained greater than 200 with additional intermittent boluses as needed. Catheter exchanged for a 5 French vert catheter, negotiated into lower lobe branch right pulmonary artery, removed over a stiff Amplatz wire. Vascular sheath was upsized, and 24 [place] FlowTriever device advanced into right pulmonary artery. Suction thrombectomy performed. Follow-up selective right pulmonary arteriography was obtained. The catheter was then redirected to the origin of  the left pulmonary artery for left pulmonary arteriography. Coaxial curved FlowTriever device advanced into distal left pulmonary artery. Suction mechanical thrombectomy performed. Follow-up selective left pulmonary arteriogram obtained. Catheter was retracted into the distal main pulmonary artery for final pulmonary arteriography and pressure measurements. Guidewire, catheter and sheath were removed and hemostasis achieved with aid of 2-0 Ethilon pursestring suture. The patient tolerated the procedure well. FLUOROSCOPY TIME:  Radiation Exposure Index (as provided by the fluoroscopic device): 169 mGy air Kerma COMPLICATIONS: None immediate. FINDINGS: Outflow right iliac venogram  show no  thrombus or occlusion. Initial right pulmonary arterial pressure69/23(40) mmHg. Selective right pulmonary arteriogram shows central partially occlusive clot extending into the intralobar branch and lower lobe segmental branches. After right pulmonary arterial selective suction thrombectomy, there is clearance of the central clot with some residual partially occlusive clot in peripheral segmental branches. Selective left pulmonary arteriogram demonstrates partially occlusive thrombus in the proximal left pulmonary artery . After left pulmonary artery selective suction thrombectomy, clearance of thrombus from the central left pulmonary artery with improved branch perfusion. Final pulmonary arterial pressure 53/18 (31)mmHg. IMPRESSION: 1. Negative for right iliac venous in a thrombus. 2. Bilateral central pulmonary emboli with markedly elevated pulmonary arterial pressures. 3. Good response to bilateral selective pulmonary arterial suction thrombectomy, with improved pulmonary arterial pressures. Electronically Signed   By: JONETTA Faes M.D.   On: 06/08/2024 14:46   CT Angio Chest PE W and/or Wo Contrast Addendum Date: 06/07/2024 ADDENDUM REPORT: 06/07/2024 23:36 ADDENDUM: Right-sided thrombus in the distal right main pulmonary  artery and right upper lobe has slightly increased compared to 05/30/2024. These results were called by telephone at the time of interpretation on 06/07/2024 at 11:36 pm to provider KEVIN PADUCHOWSKI , who verbally acknowledged these results. Electronically Signed   By: Greig Pique M.D.   On: 06/07/2024 23:36   Result Date: 06/07/2024 CLINICAL DATA:  Shortness of breath.  Near syncope. EXAM: CT ANGIOGRAPHY CHEST WITH CONTRAST TECHNIQUE: Multidetector CT imaging of the chest was performed using the standard protocol during bolus administration of intravenous contrast. Multiplanar CT image reconstructions and MIPs were obtained to evaluate the vascular anatomy. RADIATION DOSE REDUCTION: This exam was performed according to  the departmental dose-optimization program which includes automated exposure control, adjustment of the mA and/or kV according to patient size and/or use of iterative reconstruction technique. CONTRAST:  75mL OMNIPAQUE  IOHEXOL  350 MG/ML SOLN COMPARISON:  Chest CT 05/30/2024 FINDINGS: Cardiovascular: There is adequate opacification of the pulmonary arteries to the segmental level. Again seen are extensive bilateral pulmonary emboli within the distal main pulmonary arteries bilaterally extending into all lobes to the segmental level. Overall distribution and burden of thrombus has not significantly changed compared to 05/30/2024. The heart is normal in size. There is a new small pericardial effusion. Aorta is normal in size. Mediastinum/Nodes: No enlarged mediastinal, hilar, or axillary lymph nodes. Thyroid  gland, trachea, and esophagus demonstrate no significant findings. Lungs/Pleura: There are multiple new areas of ground-glass opacities predominantly in the bilateral upper lobes and minimally in the lower lobes and right middle lobe. These may represent pulmonary infarcts. Infectious etiology not excluded. No pleural effusion or pneumothorax. Upper Abdomen: Left renal atrophy again noted.  Musculoskeletal: No chest wall abnormality. No acute or significant osseous findings. Review of the MIP images confirms the above findings. IMPRESSION: 1. Stable distribution and burden of extensive bilateral pulmonary emboli. Positive for acute PE with CT evidence of right heart strain (RV/LV Ratio = 1.3) consistent with at least submassive (intermediate risk) PE. The presence of right heart strain has been associated with an increased risk of morbidity and mortality. 2. New small pericardial effusion. 3. New multifocal ground-glass opacities in the lungs may represent pulmonary infarcts. Infectious etiology not excluded. Electronically Signed: By: Greig Pique M.D. On: 06/07/2024 23:30   DG Chest 2 View Result Date: 06/07/2024 CLINICAL DATA:  Shortness of breath, history of pulmonary embolism EXAM: CHEST - 2 VIEW COMPARISON:  05/30/2024 FINDINGS: Cardiac shadow is within normal limits. Lungs are well aerated bilaterally. No focal infiltrate or effusion is seen. No bony abnormality is noted. IMPRESSION: No active cardiopulmonary disease. Electronically Signed   By: Oneil Devonshire M.D.   On: 06/07/2024 20:34    Family Communication: Discussed with patient, understand and agree. All questions answered.  Disposition: Status is: Inpatient Remains inpatient appropriate because: IV heparin , PT/ OT, therapeutic INR.  Planned Discharge Destination: Home with Home Health     Time spent: 46 minutes  Author: Concepcion Riser, MD 06/09/2024 1:42 PM Secure chat 7am to 7pm For on call review [email or web address].

## 2024-06-10 ENCOUNTER — Telehealth (HOSPITAL_COMMUNITY): Payer: Self-pay | Admitting: Pharmacy Technician

## 2024-06-10 ENCOUNTER — Encounter (HOSPITAL_COMMUNITY): Payer: Self-pay | Admitting: Pulmonary Disease

## 2024-06-10 ENCOUNTER — Other Ambulatory Visit (HOSPITAL_COMMUNITY): Payer: Self-pay

## 2024-06-10 DIAGNOSIS — E669 Obesity, unspecified: Secondary | ICD-10-CM | POA: Diagnosis not present

## 2024-06-10 DIAGNOSIS — D649 Anemia, unspecified: Secondary | ICD-10-CM | POA: Diagnosis not present

## 2024-06-10 DIAGNOSIS — I2602 Saddle embolus of pulmonary artery with acute cor pulmonale: Secondary | ICD-10-CM | POA: Diagnosis not present

## 2024-06-10 DIAGNOSIS — E1122 Type 2 diabetes mellitus with diabetic chronic kidney disease: Secondary | ICD-10-CM | POA: Diagnosis not present

## 2024-06-10 LAB — GLUCOSE, CAPILLARY
Glucose-Capillary: 125 mg/dL — ABNORMAL HIGH (ref 70–99)
Glucose-Capillary: 113 mg/dL — ABNORMAL HIGH (ref 70–99)
Glucose-Capillary: 184 mg/dL — ABNORMAL HIGH (ref 70–99)
Glucose-Capillary: 114 mg/dL — ABNORMAL HIGH (ref 70–99)
Glucose-Capillary: 131 mg/dL — ABNORMAL HIGH (ref 70–99)
Glucose-Capillary: 160 mg/dL — ABNORMAL HIGH (ref 70–99)

## 2024-06-10 LAB — CBC
HCT: 30.4 % — ABNORMAL LOW (ref 36.0–46.0)
Hemoglobin: 9.5 g/dL — ABNORMAL LOW (ref 12.0–15.0)
MCH: 26.7 pg (ref 26.0–34.0)
MCHC: 31.3 g/dL (ref 30.0–36.0)
MCV: 85.4 fL (ref 80.0–100.0)
Platelets: 226 K/uL (ref 150–400)
RBC: 3.56 MIL/uL — ABNORMAL LOW (ref 3.87–5.11)
RDW: 15.8 % — ABNORMAL HIGH (ref 11.5–15.5)
WBC: 9.3 K/uL (ref 4.0–10.5)
nRBC: 0 % (ref 0.0–0.2)

## 2024-06-10 LAB — PROTIME-INR
INR: 1.6 — ABNORMAL HIGH (ref 0.8–1.2)
Prothrombin Time: 20.3 s — ABNORMAL HIGH (ref 11.4–15.2)

## 2024-06-10 MED ORDER — GUAIFENESIN-CODEINE 100-10 MG/5ML PO SOLN
5.0000 mL | ORAL | Status: DC | PRN
Start: 2024-06-10 — End: 2024-06-15

## 2024-06-10 MED ORDER — VITAMIN B-12 1000 MCG PO TABS
1000.0000 ug | ORAL_TABLET | Freq: Every day | ORAL | Status: DC
  Administered 2024-06-10 – 2024-06-15 (×6): 1000 ug via ORAL
  Filled 2024-06-10 (×6): qty 1

## 2024-06-10 MED ORDER — CYANOCOBALAMIN 1000 MCG/ML IJ SOLN
1000.0000 ug | Freq: Once | INTRAMUSCULAR | Status: DC
  Filled 2024-06-10: qty 1

## 2024-06-10 MED ORDER — LOSARTAN POTASSIUM 50 MG PO TABS
50.0000 mg | ORAL_TABLET | Freq: Every day | ORAL | Status: DC
  Administered 2024-06-10: 50 mg via ORAL
  Filled 2024-06-10 (×2): qty 1

## 2024-06-10 MED ORDER — AMLODIPINE BESYLATE 5 MG PO TABS
2.5000 mg | ORAL_TABLET | Freq: Every day | ORAL | Status: DC
  Administered 2024-06-11 – 2024-06-15 (×5): 2.5 mg via ORAL
  Filled 2024-06-10 (×6): qty 1

## 2024-06-10 NOTE — Progress Notes (Signed)
 Progress Note   Patient: Jeanette Spencer FMW:[id number] DOB: 06/17/1951 DOA: 06/08/2024     2 DOS: the patient was seen and examined on 06/10/2024   Brief hospital course: 73 y.o. 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  Patient was found to have extensive bilateral pulmonary emboli with CT evidence of right heart strain.  Patient is admitted to ICU.  She had thrombectomy performed by IR, stable to be transferred to [facility] service 06/09/2024.  She is on heparin  drip per pharmacy protocol  Assessment and Plan: Bilateral extensive PE with right heart strain Status post thrombectomy 06/08/2024,  Heparin  drip changed to Lovenox  and Coumadin  bridging.   Venous Doppler lower extremities negative for DVT. Echocardiogram reviewed shows EF 60 to 65%, grade 1 diastolic dysfunction, right ventricular systolic function mildly reduced. Patient does not want to take Eliquis due to cost and renal dysfunction.   She feels safe on Coumadin  therapy. Plan to discharge with therapeutic INR with lovenox  for 2 more days. INR 1.6 today.  Acute on chronic anemia: B12 deficiency- Patient's hemoglobin 9.5 today from 11.4 yesterday. B12 low got IM injection yesterday, continue oral b12 1000mcg. She will need to follow PCP for IM injection weekly for 1 month then monthly with follow up level. Continue to monitor H&H closely and transfuse for hemoglobin less than 7.  CKD stage III: Stable creatinine. Avoid nephrotoxic drugs. Continue to monitor daily renal function.  Type 2 diabetes mellitus: Hold oral hypoglycemics. A1c 7.4 Accu-Cheks, sliding scale insulin .  Hypertension: BP stable. Resumed Norvasc , losartan  from tomorrow.  Hyperlipidemia-continue  statin  Obesity class II- BMI 37.54 Diet, exercise and weight reduction advised.     Out of bed to chair. Incentive spirometry. Nursing supportive care. Fall, aspiration precautions. Diet:  Diet Orders (From admission, onward)     Start     Ordered   06/08/24 1406  Diet regular Room service appropriate? Yes; Fluid consistency: Thin  Diet effective now       Question Answer Comment  Room service appropriate? Yes   Fluid consistency: Thin      06/08/24 1406           DVT prophylaxis: SCDs Start: 06/08/24 0305 warfarin (COUMADIN ) tablet 5 mg  Level of care: Telemetry Medical   Code Status: Full Code  Subjective: Patient is seen and examined today morning.  She is lying comfortably.   Eating fair. Did get out of bed today. No complaints of chest pain. She wanted me to let her pulmonologist know about this admission.  Physical Exam: Vitals:   06/10/24 1100 06/10/24 1129 06/10/24 1200 06/10/24 1300  BP:   (!) 146/68 (!) 150/79  Pulse: 67 69 63 84  Resp: 18 18 14  (!) 22  Temp:  98.2 F (36.8 C)    TempSrc:  Oral    SpO2: 97% 98% 97% 97%  Weight:      Height:        General - Elderly obese Caucasian female, no apparent distress HEENT - PERRLA, EOMI, atraumatic head, non tender sinuses. Lung - Clear, basal rales, rhonchi, wheezes. Heart - S1, S2 heard, no murmurs, rubs, trace pedal edema. Abdomen - Soft, non tender, bowel sounds good Neuro - Alert, awake and oriented x 3, non focal exam.  Skin - Warm and dry.  Data Reviewed:      Latest Ref Rng & Units 06/10/2024    8:42 AM 06/09/2024    2:12 AM 06/08/2024    3:23 AM  CBC  WBC 4.0 - 10.5 K/uL 9.3  11.9  11.2   Hemoglobin 12.0 - 15.0 g/dL 9.5  9.7  88.5   Hematocrit 36.0 - 46.0 % 30.4  31.1  36.2   Platelets 150 - 400 K/uL 226  218  250       Latest Ref Rng & Units 06/08/2024    3:23 AM 06/07/2024    7:59 PM 02/19/2021   11:03 AM  BMP  Glucose 70 - 99 mg/dL 867  818  834   BUN 8 - 23 mg/dL 18  24  29     Creatinine 0.44 - 1.00 mg/dL 8.80  8.63  8.58   Sodium 135 - 145 mmol/L 141  138  140   Potassium 3.5 - 5.1 mmol/L 4.5  4.3  4.5   Chloride 98 - 111 mmol/L 108  107  104   CO2 22 - 32 mmol/L 21  21  28    Calcium  8.9 - 10.3 mg/dL 9.4  9.1  89.9    VAS US  LOWER EXTREMITY VENOUS (DVT) Result Date: 06/09/2024  Lower Venous DVT Study Patient Name:  Jeanette Spencer  Date of Exam:   06/08/2024 Medical Rec #: [id number]         Accession #:    [id number] Date of Birth: 01/03/1951         Patient Gender: F Patient Age:   46 years Exam Location:  [facility] Procedure:      VAS US  LOWER EXTREMITY VENOUS (DVT) Referring Phys: MANCEL PLY --------------------------------------------------------------------------------  Indications: Pulmonary embolism.  Limitations: Bandages (patient status post thrombectomy today), poor ultrasound/tissue interface and body habitus. Comparison Study: No prior study on file Performing Technologist: Alberta Lis RVS  Examination Guidelines: A complete evaluation includes B-mode imaging, spectral Doppler, color Doppler, and power Doppler as needed of all accessible portions of each vessel. Bilateral testing is considered an integral part of a complete examination. Limited examinations for reoccurring indications may be performed as noted. The reflux portion of the exam is performed with the patient in reverse Trendelenburg.  +---------+---------------+---------+-----------+---------------+--------------+ RIGHT    CompressibilityPhasicitySpontaneityProperties     Thrombus Aging +---------+---------------+---------+-----------+---------------+--------------+ CFV                                                        Not                                                                       visualized/ban                                                            dages           +---------+---------------+---------+-----------+---------------+--------------+  SFJ                                                        Not                                                                       visualized/ban                                                            dages          +---------+---------------+---------+-----------+---------------+--------------+ FV Prox  Full           Yes      No         pulsatile                                                                 waveform                      +---------+---------------+---------+-----------+---------------+--------------+ FV Mid   Full                                                             +---------+---------------+---------+-----------+---------------+--------------+ FV DistalFull                                                             +---------+---------------+---------+-----------+---------------+--------------+ PFV      Full           Yes      No         pulsatile                                                                 waveform                      +---------+---------------+---------+-----------+---------------+--------------+ POP      Full           Yes      No                                       +---------+---------------+---------+-----------+---------------+--------------+  PTV      Full                                                             +---------+---------------+---------+-----------+---------------+--------------+ PERO     Full                                                             +---------+---------------+---------+-----------+---------------+--------------+   +---------+---------------+---------+-----------+---------------+--------------+ LEFT     CompressibilityPhasicitySpontaneityProperties     Thrombus Aging +---------+---------------+---------+-----------+---------------+--------------+  CFV      Full           Yes      No         pulsatile                                                                 waveform                      +---------+---------------+---------+-----------+---------------+--------------+ SFJ      Full                                                             +---------+---------------+---------+-----------+---------------+--------------+ FV Prox  Full           Yes      No         pulsatile                                                                 waveform                      +---------+---------------+---------+-----------+---------------+--------------+ FV Mid   Full           Yes      No         pulsatile                                                                 waveform                      +---------+---------------+---------+-----------+---------------+--------------+ FV DistalFull                                                             +---------+---------------+---------+-----------+---------------+--------------+  PFV      Full                                                             +---------+---------------+---------+-----------+---------------+--------------+ POP                     Yes      No         pulsatile                                                                 waveform                      +---------+---------------+---------+-----------+---------------+--------------+ PTV      Full                                                             +---------+---------------+---------+-----------+---------------+--------------+ PERO                                                       Not well                                                                  visualized     +---------+---------------+---------+-----------+---------------+--------------+     Summary: RIGHT: - There is no evidence of deep vein thrombosis in the lower  extremity. However, portions of this examination were limited- see technologist comments above.  Pulsatile waveforms noted throughout  LEFT: - There is no evidence of deep vein thrombosis in the lower extremity. However, portions of this examination were limited- see technologist comments above.  Pulsatile waveforms noted throughout.  *See table(s) above for measurements and observations. Electronically signed by Fonda Rim on 06/09/2024 at 9:16:04 AM.    Final    ECHOCARDIOGRAM COMPLETE Result Date: 06/08/2024    ECHOCARDIOGRAM REPORT   Patient Name:   Jeanette Spencer Date of Exam: 06/08/2024 Medical Rec #:  [id number]        Height:       59.0 in Accession #:    [id number]       Weight:       185.8 lb Date of Birth:  [birth year]        BSA:          1.788 m Patient Age:    73 years         BP:           120/72 mmHg Patient Gender: F  HR:           82 bpm. Exam Location:  Inpatient Procedure: 2D Echo, Cardiac Doppler and Color Doppler (Both Spectral and Color            Flow Doppler were utilized during procedure). Indications:    Pulmonary Embolus I26.09  History:        Patient has no prior history of Echocardiogram examinations.                 Risk Factors:Diabetes and Hypertension.  Sonographer:    Tinnie Gosling RDCS Referring Phys: [id number] OMAR M ALBUSTAMI IMPRESSIONS  1. Left ventricular ejection fraction, by estimation, is 60 to 65%. The left ventricle has normal function. Left ventricular endocardial border not optimally defined to evaluate regional wall motion. There is mild left ventricular hypertrophy. Left ventricular diastolic parameters are consistent with Grade I diastolic dysfunction (impaired relaxation).  2. Right ventricular systolic function is mildly reduced. The right ventricular size is normal. Tricuspid regurgitation signal is inadequate for assessing PA pressure.  3. A small pericardial effusion is present.  4. The mitral valve is degenerative. Trivial mitral valve  regurgitation. No evidence of mitral stenosis.  5. The aortic valve is grossly normal. There is mild calcification of the aortic valve. Aortic valve regurgitation is not visualized. No aortic stenosis is present.  6. The inferior vena cava is normal in size with <50% respiratory variability, suggesting right atrial pressure of 8 mmHg. FINDINGS  Left Ventricle: Left ventricular ejection fraction, by estimation, is 60 to 65%. The left ventricle has normal function. Left ventricular endocardial border not optimally defined to evaluate regional wall motion. The left ventricular internal cavity size was normal in size. There is mild left ventricular hypertrophy. Left ventricular diastolic parameters are consistent with Grade I diastolic dysfunction (impaired relaxation). Right Ventricle: The right ventricular size is normal. No increase in right ventricular wall thickness. Right ventricular systolic function is mildly reduced. Tricuspid regurgitation signal is inadequate for assessing PA pressure. The tricuspid regurgitant velocity is 1.90 m/s, and with an assumed right atrial pressure of 8 mmHg, the estimated right ventricular systolic pressure is 22.4 mmHg. Left Atrium: Left atrial size was normal in size. Right Atrium: Right atrial size was normal in size. Pericardium: A small pericardial effusion is present. Mitral Valve: The mitral valve is degenerative in appearance. There is mild calcification of the mitral valve leaflet(s). Trivial mitral valve regurgitation. No evidence of mitral valve stenosis. Tricuspid Valve: The tricuspid valve is grossly normal. Tricuspid valve regurgitation is trivial. No evidence of tricuspid stenosis. Aortic Valve: The aortic valve is grossly normal. There is mild calcification of the aortic valve. Aortic valve regurgitation is not visualized. No aortic stenosis is present. Pulmonic Valve: The pulmonic valve was normal in structure. Pulmonic valve regurgitation is not visualized. No  evidence of pulmonic stenosis. Aorta: The aortic root is normal in size and structure. Venous: The inferior vena cava is normal in size with less than 50% respiratory variability, suggesting right atrial pressure of 8 mmHg. IAS/Shunts: No atrial level shunt detected by color flow Doppler.  LEFT VENTRICLE PLAX 2D LVIDd:         3.40 cm   Diastology LVIDs:         2.50 cm   LV e' medial:    6.31 cm/s LV PW:         1.30 cm   LV E/e' medial:  11.4 LV IVS:        1.30 cm  LV e' lateral:   8.16 cm/s LVOT diam:     2.10 cm   LV E/e' lateral: 8.8 LV SV:         66 LV SV Index:   37 LVOT Area:     3.46 cm  RIGHT VENTRICLE            IVC RV S prime:     9.68 cm/s  IVC diam: 2.00 cm TAPSE (M-mode): 1.7 cm LEFT ATRIUM             Index        RIGHT ATRIUM           Index LA diam:        3.70 cm 2.07 cm/m   RA Area:     14.70 cm LA Vol (A2C):   36.9 ml 20.64 ml/m  RA Volume:   36.60 ml  20.47 ml/m LA Vol (A4C):   32.7 ml 18.29 ml/m LA Biplane Vol: 35.8 ml 20.03 ml/m  AORTIC VALVE LVOT Vmax:   92.30 cm/s LVOT Vmean:  67.000 cm/s LVOT VTI:    0.191 m  AORTA Ao Root diam: 2.90 cm Ao Asc diam:  3.20 cm MITRAL VALVE               TRICUSPID VALVE MV Area (PHT): 2.84 cm    TR Peak grad:   14.4 mmHg MV Decel Time: 267 msec    TR Vmax:        190.00 cm/s MV E velocity: 71.80 cm/s MV A velocity: 99.20 cm/s  SHUNTS MV E/A ratio:  0.72        Systemic VTI:  0.19 m                            Systemic Diam: 2.10 cm Soyla Merck MD Electronically signed by Soyla Merck MD Signature Date/Time: 06/08/2024/4:56:52 PM    Final     Family Communication: Discussed with patient, understand and agree. All questions answered.  Disposition: Status is: Inpatient Remains inpatient appropriate because: once therapeutic INR with PT/ OT recs.  Planned Discharge Destination: Home with Home Health     Time spent: 45 minutes  Author: Concepcion Riser, MD 06/10/2024 3:14 PM Secure chat 7am to 7pm For on call review  [email or web address].

## 2024-06-10 NOTE — Telephone Encounter (Signed)
 Patient Product/process development scientist completed.    The patient is insured through [employer]. Patient has Medicare and is not eligible for a copay card, but may be able to apply for patient assistance or Medicare RX Payment Plan (Patient Must reach out to their plan, if eligible for payment plan), if available.    Ran test claim for enoxaparin  (Lovenox ) 100 mg/ml and the current 30 day co-pay is $545.31 due to a $420.00 deductible.   This test claim was processed through [facility]- copay amounts may vary at other pharmacies due to pharmacy/plan contracts, or as the patient moves through the different stages of their insurance plan.     Reyes Sharps, CPHT Pharmacy Technician III Certified Patient Advocate [facility] Patient Advocate Team Direct Number: [phone]  Fax: [phone]

## 2024-06-10 NOTE — TOC CM/SW Note (Signed)
 Transition of Care [facility]) - Inpatient Brief Assessment   Patient Details  Name: Jeanette Spencer MRN: [id number] Date of Birth: 07-05-1951  Transition of Care [facility]) CM/SW Contact:    Lauraine FORBES Saa, LCSWA Phone Number: 06/10/2024, 11:21 AM   Clinical Narrative:  11:21 AM Per chart review, patient resides at home. Patient has a PCP and insurance. Patient does not have SNF/HH/DME history. Patient's preferred pharmacy's are [facility] [address] [facility] [address], and [email or web address] [place]. Patient declined CSW offer of SDOH (utilities, social connections) resources. No other TOC needs identified at this time. TOC will continue to follow and be available to assist.  Transition of Care Asessment: Insurance and Status: Insurance coverage has been reviewed Patient has primary care physician: Yes Home environment has been reviewed: Private Residence Prior level of function:: N/A Prior/Current Home Services: No current home services Social Drivers of Health Review: SDOH reviewed no interventions necessary Readmission risk has been reviewed: Yes (Currently Yellow 15%) Transition of care needs: no transition of care needs at this time

## 2024-06-10 NOTE — Progress Notes (Signed)
 Patient transferred to 2W04 via wheelchair and monitor .2W RN Maya informed patient is in room in the bed with callbell in reach

## 2024-06-10 NOTE — Progress Notes (Addendum)
 PHARMACY - ANTICOAGULATION CONSULT NOTE  Pharmacy Consult for warfarin / lovenox    Indication: pulmonary embolus  Allergies  Allergen Reactions   Oxycodone      Stops her breathing   Glipizide      Recurrent hypoglycemia   Lipitor [Atorvastatin ]     Affected balance   Penicillin G     Other reaction(s): Unknown    Patient Measurements: Height: 4' 11 (149.9 cm) Weight: 84.3 kg (185 lb 13.6 oz) IBW/kg (Calculated) : 43.2 HEPARIN  DW (KG): 63.1  Vital Signs: Temp: 97.9 F (36.6 C) (09/15 0714) Temp Source: Oral (09/15 0714) BP: 126/59 (09/15 0800) Pulse Rate: 87 (09/15 0800)  Labs: Recent Labs    06/07/24 1959 06/07/24 2219 06/08/24 0323 06/08/24 0801 06/08/24 2046 06/09/24 0212 06/10/24 0842  HGB 11.4*  --  11.4*  --   --  9.7* 9.5*  HCT 36.1  --  36.2  --   --  31.1* 30.4*  PLT 241  --  250  --   --  218 226  LABPROT  --  16.4*  --   --   --   --  20.3*  INR  --  1.3*  --   --   --   --  1.6*  HEPARINUNFRC  --   --   --  0.40 0.69 0.69  --   CREATININE 1.36*  --  1.19*  --   --   --   --   TROPONINIHS  --  25*  --   --   --   --   --     Estimated Creatinine Clearance: 39 mL/min (A) (by C-G formula based on SCr of 1.19 mg/dL (H)).   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  Pt presents with worsening clot burden,  right heart strain and bilateral PE.   MD requests to start heparin  drip + bolus.   9/12:  INR @ 2219 = 1.3 9/13 AM heparin  level therapeutic at 0.40 on 1000 units/hr. Currently in IR for venogram with possible Pulmonary Thrombectomy.  9/13 PM heparin  level 0.69 - remains in  therapeutic range but trending up.  No overt bleeding or complications noted. 9/14 AM heparin  level therapeutic at 0.69 at upper end of therapeutic range despite small dose decrease. Small drop in hb (9.7 from 11.4). No bleeding noted by RN, EBL in IR was minimal per note.  9/14 afternoon >> transition to therapeutic enoxaparin  / warfarin. Last INR on 9/12 @ 1.3.  9/15 INR 1.6, will take a couple of days to get >2, but up from admission (1.3) despite missing 1 dose on 9/13).  Goal of Therapy:  INR 2-3 Heparin  level 0.3-0.7 units/ml Monitor platelets by anticoagulation protocol: Yes   Plan:  Continue enoxaparin  80 mg q12h  Warfarin 5 mg daily Overlap of enoxaparin  / warfarin until 2x therapeutic INR Daily INR  Continue to monitor H&H and platelets  Rankin Sams, PharmD, BCPS, BCCCP Clinical Pharmacist

## 2024-06-11 ENCOUNTER — Ambulatory Visit: Admission: RE | Admit: 2024-06-11 | Payer: Medicare (Managed Care) | Source: Ambulatory Visit

## 2024-06-11 DIAGNOSIS — E1122 Type 2 diabetes mellitus with diabetic chronic kidney disease: Secondary | ICD-10-CM | POA: Diagnosis not present

## 2024-06-11 DIAGNOSIS — E669 Obesity, unspecified: Secondary | ICD-10-CM | POA: Diagnosis not present

## 2024-06-11 DIAGNOSIS — I2602 Saddle embolus of pulmonary artery with acute cor pulmonale: Secondary | ICD-10-CM | POA: Diagnosis not present

## 2024-06-11 DIAGNOSIS — D649 Anemia, unspecified: Secondary | ICD-10-CM | POA: Diagnosis not present

## 2024-06-11 LAB — CBC
HCT: 30 % — ABNORMAL LOW (ref 36.0–46.0)
Hemoglobin: 9.2 g/dL — ABNORMAL LOW (ref 12.0–15.0)
MCH: 26 pg (ref 26.0–34.0)
MCHC: 30.7 g/dL (ref 30.0–36.0)
MCV: 84.7 fL (ref 80.0–100.0)
Platelets: 232 K/uL (ref 150–400)
RBC: 3.54 MIL/uL — ABNORMAL LOW (ref 3.87–5.11)
RDW: 15.6 % — ABNORMAL HIGH (ref 11.5–15.5)
WBC: 8.3 K/uL (ref 4.0–10.5)
nRBC: 0 % (ref 0.0–0.2)

## 2024-06-11 LAB — PROTIME-INR
INR: 1.7 — ABNORMAL HIGH (ref 0.8–1.2)
Prothrombin Time: 21.2 s — ABNORMAL HIGH (ref 11.4–15.2)

## 2024-06-11 LAB — GLUCOSE, CAPILLARY
Glucose-Capillary: 115 mg/dL — ABNORMAL HIGH (ref 70–99)
Glucose-Capillary: 118 mg/dL — ABNORMAL HIGH (ref 70–99)
Glucose-Capillary: 153 mg/dL — ABNORMAL HIGH (ref 70–99)
Glucose-Capillary: 180 mg/dL — ABNORMAL HIGH (ref 70–99)
Glucose-Capillary: 195 mg/dL — ABNORMAL HIGH (ref 70–99)
Glucose-Capillary: 81 mg/dL (ref 70–99)

## 2024-06-11 MED ORDER — INSULIN ASPART 100 UNIT/ML IJ SOLN
0.0000 [IU] | Freq: Every day | INTRAMUSCULAR | Status: DC
  Administered 2024-06-13: 2 [IU] via SUBCUTANEOUS

## 2024-06-11 MED ORDER — INSULIN ASPART 100 UNIT/ML IJ SOLN
0.0000 [IU] | Freq: Three times a day (TID) | INTRAMUSCULAR | Status: DC
  Administered 2024-06-11: 2 [IU] via SUBCUTANEOUS
  Administered 2024-06-12 – 2024-06-13 (×3): 1 [IU] via SUBCUTANEOUS
  Administered 2024-06-13: 3 [IU] via SUBCUTANEOUS
  Administered 2024-06-14: 2 [IU] via SUBCUTANEOUS
  Administered 2024-06-14 (×2): 1 [IU] via SUBCUTANEOUS
  Administered 2024-06-15: 2 [IU] via SUBCUTANEOUS
  Administered 2024-06-15: 3 [IU] via SUBCUTANEOUS

## 2024-06-11 MED ORDER — LOSARTAN POTASSIUM 50 MG PO TABS
50.0000 mg | ORAL_TABLET | Freq: Every day | ORAL | Status: DC
  Administered 2024-06-11 – 2024-06-14 (×4): 50 mg via ORAL
  Filled 2024-06-11 (×4): qty 1

## 2024-06-11 NOTE — Plan of Care (Signed)

## 2024-06-11 NOTE — Progress Notes (Signed)
 PHARMACY - ANTICOAGULATION CONSULT NOTE  Pharmacy Consult for warfarin / lovenox    Indication: pulmonary embolus  Allergies  Allergen Reactions   Oxycodone      Stops her breathing   Glipizide      Recurrent hypoglycemia   Lipitor [Atorvastatin ]     Affected balance   Penicillin G     Other reaction(s): Unknown    Patient Measurements: Height: 4' 11 (149.9 cm) Weight: 84.3 kg (185 lb 13.6 oz) IBW/kg (Calculated) : 43.2 HEPARIN  DW (KG): 63.1  Vital Signs: Temp: 98.6 F (37 C) (09/16 1139) Temp Source: Oral (09/16 1139) BP: 116/62 (09/16 1139) Pulse Rate: 68 (09/16 1139)  Labs: Recent Labs    06/08/24 2046 06/09/24 0212 06/09/24 0212 06/10/24 0842 06/11/24 0547  HGB  --  9.7*   < > 9.5* 9.2*  HCT  --  31.1*  --  30.4* 30.0*  PLT  --  218  --  226 232  LABPROT  --   --   --  20.3* 21.2*  INR  --   --   --  1.6* 1.7*  HEPARINUNFRC 0.69 0.69  --   --   --    < > = values in this interval not displayed.    Estimated Creatinine Clearance: 39 mL/min (A) (by C-G formula based on SCr of 1.19 mg/dL (H)).   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  Pt presents with worsening clot burden,  right heart strain and bilateral PE.   MD requests to start heparin  drip + bolus.   9/12:  INR @ 2219 = 1.3 9/13 AM heparin  level therapeutic at 0.40 on 1000 units/hr. Currently in IR for venogram with possible Pulmonary Thrombectomy.  9/13 PM heparin  level 0.69 - remains in therapeutic range but trending up.  No overt bleeding or complications noted. 9/14 AM heparin  level therapeutic at 0.69 at upper end of  therapeutic range despite small dose decrease. Small drop in hb (9.7 from 11.4). No bleeding noted by RN, EBL in IR was minimal per note.  9/14 afternoon >> transition to therapeutic enoxaparin  / warfarin. Last INR on 9/12 @ 1.3.  9/15 INR 1.6, will take a couple of days to get >2, but up from admission (1.3) despite missing 1 dose on 9/13). 9/16 INR 1.7, rising as expected, no change.  Goal of Therapy:  INR 2-3 Heparin  level 0.3-0.7 units/ml Monitor platelets by anticoagulation protocol: Yes   Plan:  Continue enoxaparin  80 mg q12h  Warfarin 5 mg daily Overlap of enoxaparin  / warfarin until 2x therapeutic INR Daily INR  Continue to monitor H&H and platelets    [employer], Pharm.D., BCPS Clinical Pharmacist Clinical phone for 06/11/2024 from 7:30-3:00 is [phone].  **Pharmacist phone directory can be found on [email or web address] listed under [facility].  06/11/2024 1:44 PM

## 2024-06-11 NOTE — Evaluation (Signed)
 Physical Therapy Evaluation Patient Details Name: Jeanette Spencer MRN: [id number] DOB: 1951/03/07 Today's Date: 06/11/2024  History of Present Illness  Patient is a 73 y/o female admitted 06/08/24 with syncope and dyspnea.  Recent admission at [place], [place] facility with PT 05/31/24 and d/c home on Coumadin .  Found to have bilateral extensive PE with evidence of R heart strain.  Underwent IR thrombectomy 06/08/24.  Continues on Lovenox  bridge with plans for Coumadin  at d/c.  Current INR 1.7 cleared to mobilize per MD.  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  Clinical Impression  Patient presents with decreased mobility due to generalized weakness, decreased balance, and limited activity tolerance.  Patient normally independent using cane and transporting great grandkids to school.  She was able to ambulate in hallway with RW and negotiated stairs appropriate for home entry.  She c/o dizziness throughout but reports this has been a chronic issue with some of her meds.  VSS with HR in 80-90's and SpO2 on RA 94-98%.   Feel stable for home when medically ready with follow up HHPT and youth RW for home.  PT will follow up while in the acute setting.       If plan is discharge home, recommend the following: Help with stairs or ramp for entrance;Assistance with cooking/housework   Can travel by private vehicle        Equipment Recommendations Rolling walker (2 wheels) (youth RW please)  Recommendations for Other Services       Functional Status Assessment Patient has had a recent decline in their functional status and demonstrates the ability to make significant improvements in function in a reasonable and predictable amount of time.     Precautions / Restrictions Precautions Precautions: Fall Recall of Precautions/Restrictions: Intact      Mobility  Bed Mobility               General bed mobility comments: in recliner post OT     Transfers Overall transfer level: Needs assistance Equipment used: Rolling walker (2 wheels) Transfers: Sit to/from Stand Sit to Stand: Supervision           General transfer comment: RW set up prior to standing    Ambulation/Gait Ambulation/Gait assistance: Supervision Gait Distance (Feet): 250 Feet Assistive device: Rolling walker (2 wheels) Gait Pattern/deviations: Step-through pattern, Step-to pattern, Decreased stride length       General Gait Details: mild antalgia at times, walker set up for height though walker somewhat off balance  Stairs Stairs: Yes Stairs assistance: Supervision, Min assist Stair Management: Step to pattern, Forwards, One rail Right Number of Stairs: 4 General stair comments: ascended with one rail, descended with rail and HHA due to pt reports usually using cane at home  Wheelchair Mobility     Tilt Bed    Modified Rankin (Stroke Patients Only)       Balance Overall balance assessment: Needs assistance   Sitting balance-Leahy Scale: Good       Standing balance-Leahy Scale: Fair Standing balance comment: toileted and hand washing unsupported                             Pertinent Vitals/Pain Pain Assessment Pain Assessment: Faces Faces Pain Scale: Hurts little more Pain Location: knee discomfort with ambulation Pain Descriptors / Indicators: Discomfort Pain Intervention(s): Monitored during session    Home Living Family/patient expects to be discharged to:: Private residence  Living Arrangements: Alone Available Help at Discharge: Family;Available PRN/intermittently Type of Home: House Home Access: Stairs to enter Entrance Stairs-Rails: Doctor, general practice of Steps: 4   Home Layout: One level Home Equipment: Cane - single point      Prior Function Prior Level of Function : Independent/Modified Independent;Driving             Mobility Comments: using cane at baseline and compression  stockings due to LE swelling, drives her great grandkids to school and back (she lives in [place], they live in [place]) ADLs Comments: independent     Extremity/Trunk Assessment   Upper Extremity Assessment Upper Extremity Assessment: Defer to OT evaluation    Lower Extremity Assessment Lower Extremity Assessment: Generalized weakness       Communication   Communication Communication: No apparent difficulties    Cognition Arousal: Alert Behavior During Therapy: WFL for tasks assessed/performed   PT - Cognitive impairments: No apparent impairments                         Following commands: Intact       Cueing Cueing Techniques: Verbal cues     General Comments General comments (skin integrity, edema, etc.): SpO2 on RA throughout 94% with ambulation 98% after stair negotiation, HR 90's after stairs, 80's with ambulation.    Exercises     Assessment/Plan    PT Assessment Patient needs continued PT services  PT Problem List Decreased balance;Decreased mobility;Decreased activity tolerance;Decreased knowledge of use of DME       PT Treatment Interventions DME instruction;Gait training;Stair training;Patient/family education;Functional mobility training;Therapeutic activities;Therapeutic exercise;Balance training    PT Goals (Current goals can be found in the Care Plan section)  Acute Rehab PT Goals Patient Stated Goal: return to independent PT Goal Formulation: With patient Time For Goal Achievement: 06/25/24 Potential to Achieve Goals: Good    Frequency Min 2X/week     Co-evaluation               AM-PAC PT 6 Clicks Mobility  Outcome Measure Help needed turning from your back to your side while in a flat bed without using bedrails?: A Little Help needed moving from lying on your back to sitting on the side of a flat bed without using bedrails?: A Little Help needed moving to and from a bed to a chair (including a wheelchair)?:  None Help needed standing up from a chair using your arms (e.g., wheelchair or bedside chair)?: None Help needed to walk in hospital room?: None Help needed climbing 3-5 steps with a railing? : A Little 6 Click Score: 21    End of Session Equipment Utilized During Treatment: Gait belt Activity Tolerance: Patient tolerated treatment well Patient left: in chair;with call bell/phone within reach   PT Visit Diagnosis: Other abnormalities of gait and mobility (R26.89)    Time: 8960-8893 PT Time Calculation (min) (ACUTE ONLY): 27 min   Charges:   PT Evaluation $PT Eval Moderate Complexity: 1 Mod PT Treatments $Gait Training: 8-22 mins PT General Charges $$ ACUTE PT VISIT: 1 Visit         Micheline Portal, PT Acute Rehabilitation Services Office:[phone] 06/11/2024   Montie Portal 06/11/2024, 11:36 AM

## 2024-06-11 NOTE — Progress Notes (Signed)
 Progress Note   Patient: Jeanette Spencer FMW:[id number] DOB: 12/06/1950 DOA: 06/08/2024     3 DOS: the patient was seen and examined on 06/11/2024   Brief hospital course: 73 y.o. 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  Patient was found to have extensive bilateral pulmonary emboli with CT evidence of right heart strain.  Patient is admitted to ICU.  She had thrombectomy performed by IR, stable to be transferred to [facility] service 06/09/2024.  She is on heparin  drip per pharmacy protocol  Assessment and Plan: Bilateral extensive PE with right heart strain Status post thrombectomy 06/08/2024,  Heparin  drip changed to Lovenox  and Coumadin  bridging.   Venous Doppler lower extremities negative for DVT. Echocardiogram reviewed shows EF 60 to 65%, grade 1 diastolic dysfunction, right ventricular systolic function mildly reduced. Patient does not want to take Eliquis due to cost and renal dysfunction.   She feels safe on Coumadin  therapy. Plan to discharge with therapeutic INR with lovenox  for 2 more days. INR 1.7 today.  Acute on chronic anemia: B12 deficiency- Patient's hemoglobin 9.5 today from 11.4 yesterday. B12 low got IM injection 9/14, continue oral b12 1000mcg. She will need to follow PCP for IM injection weekly for 1 month then monthly with follow up level. Continue to monitor H&H closely and transfuse for hemoglobin less than 7.  CKD stage III: Stable creatinine. Avoid nephrotoxic drugs. Continue to monitor daily renal function.  Type 2 diabetes mellitus: A1c 7.4 Accu-Cheks, sliding scale insulin . Resume oral hypoglycemics upon discharge.  Hypertension: BP stable. Continue Norvasc , losartan  home dose.  Hyperlipidemia-continue  statin  Obesity class II- BMI 37.54. Diet, exercise and weight reduction advised.    Rolling walker, HH PT/ OT orders signed Out of bed to chair. Incentive spirometry. Nursing supportive care. Fall, aspiration precautions. Diet:  Diet Orders (From admission, onward)     Start     Ordered   06/08/24 1406  Diet regular Room service appropriate? Yes; Fluid consistency: Thin  Diet effective now       Question Answer Comment  Room service appropriate? Yes   Fluid consistency: Thin      06/08/24 1406           DVT prophylaxis: SCDs Start: 06/08/24 0305 warfarin (COUMADIN ) tablet 5 mg  Level of care: Telemetry Medical   Code Status: Full Code  Subjective: Patient is seen and examined today morning.  She is lying comfortably.   Eating fair. Working with PT at bedside. No complaints..  Physical Exam: Vitals:   06/10/24 2020 06/11/24 0031 06/11/24 0403 06/11/24 1139  BP: (!) 146/64 133/61 120/63 116/62  Pulse: 75 70 67 68  Resp: 18 17 17 18   Temp: 98.6 F (37 C) 98.1 F (36.7 C) 97.9 F (36.6 C) 98.6 F (37 C)  TempSrc: Oral Oral Oral Oral  SpO2: 96% 98% 99% 100%  Weight:      Height:        General - Elderly obese Caucasian female, no apparent distress HEENT - PERRLA, EOMI, atraumatic head, non tender sinuses. Lung - Clear, basal rales, rhonchi, wheezes. Heart - S1, S2 heard, no murmurs, rubs, trace pedal edema. Abdomen - Soft, non tender, bowel sounds good Neuro - Alert, awake and oriented x 3, non focal exam.  Skin - Warm and dry.  Data Reviewed:      Latest Ref Rng & Units 06/11/2024    5:47 AM 06/10/2024    8:42 AM 06/09/2024    2:12 AM  CBC  WBC 4.0 - 10.5 K/uL 8.3  9.3  11.9   Hemoglobin 12.0 - 15.0 g/dL 9.2  9.5  9.7   Hematocrit 36.0 - 46.0 % 30.0  30.4  31.1   Platelets 150 - 400 K/uL 232  226  218       Latest Ref Rng & Units 06/08/2024    3:23 AM 06/07/2024    7:59 PM 02/19/2021   11:03 AM  BMP  Glucose 70 - 99 mg/dL 867  818  834   BUN 8 -  23 mg/dL 18  24  29    Creatinine 0.44 - 1.00 mg/dL 8.80  8.63  8.58   Sodium 135 - 145 mmol/L 141  138  140   Potassium 3.5 - 5.1 mmol/L 4.5  4.3  4.5   Chloride 98 - 111 mmol/L 108  107  104   CO2 22 - 32 mmol/L 21  21  28    Calcium  8.9 - 10.3 mg/dL 9.4  9.1  89.9    No results found.   Family Communication: Discussed with patient, understand and agree. All questions answered.  Disposition: Status is: Inpatient Remains inpatient appropriate because: once therapeutic INR with PT/ OT recs.  Planned Discharge Destination: Home with Home Health     Time spent: 44 minutes  Author: Concepcion Riser, MD 06/11/2024 12:41 PM Secure chat 7am to 7pm For on call review [email or web address].

## 2024-06-11 NOTE — Care Management Important Message (Signed)
 Important Message  Patient Details  Name: Jeanette Spencer MRN: [id number] Date of Birth: 1951/03/30   Important Message Given:  Yes - Medicare IM     Claretta Deed 06/11/2024, 4:13 PM

## 2024-06-11 NOTE — Evaluation (Signed)
 Occupational Therapy Evaluation Patient Details Name: Jeanette Spencer MRN: [id number] DOB: [birth year] Today's Date: 06/11/2024   History of Present Illness   Patient is a 73 y/o female admitted 06/08/24 with syncope and dyspnea.  Recent admission at [place], [place] facility with PT 05/31/24 and d/c home on Coumadin .  Found to have bilateral extensive PE with evidence of R heart strain.  Underwent IR thrombectomy 06/08/24.  Continues on Lovenox  bridge with plans for Coumadin  at d/c.  Current INR 1.7 cleared to mobilize per MD.  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     Clinical Impressions PTA pt lives alone independently with her cat Lulu and enjoys spending time with her grandchildren. Pt overall S with mobility and ADL tasks @ RW level due to below listed deficits. Recommend HHOT to maximize functional level of independence with IADL tasks and increase home safety. Acute OT to follow to facilitate safe DC home.  HR 89; SpO2 98 on RA without DOE     If plan is discharge home, recommend the following:   Assistance with cooking/housework;Assist for transportation     Functional Status Assessment   Patient has had a recent decline in their functional status and demonstrates the ability to make significant improvements in function in a reasonable and predictable amount of time.     Equipment Recommendations   BSC/3in1     Recommendations for Other Services         Precautions/Restrictions   Precautions Precautions: Fall Recall of Precautions/Restrictions: Intact Restrictions Weight Bearing Restrictions Per Provider Order: No     Mobility Bed Mobility Overal bed mobility: Modified Independent                  Transfers Overall transfer level: Needs assistance Equipment used: Rolling walker (2 wheels) Transfers: Sit to/from Stand Sit to Stand: Supervision           General transfer comment: cues for hand  placement      Balance Overall balance assessment: Needs assistance   Sitting balance-Leahy Scale: Good       Standing balance-Leahy Scale: Fair                             ADL either performed or assessed with clinical judgement   ADL Overall ADL's : Needs assistance/impaired Eating/Feeding: Independent   Grooming: Supervision/safety;Standing   Upper Body Bathing: Set up;Sitting   Lower Body Bathing: Supervison/ safety;Sit to/from stand   Upper Body Dressing : Set up;Sitting   Lower Body Dressing: Supervision/safety;Sit to/from stand   Toilet Transfer: Supervision/safety;Ambulation;Rolling walker (2 wheels)   Toileting- Clothing Manipulation and Hygiene: Supervision/safety;Sit to/from stand       Functional mobility during ADLs: Supervision/safety;Rolling walker (2 wheels);Cueing for safety       Vision Baseline Vision/History: 1 Wears glasses       Perception         Praxis         Pertinent Vitals/Pain Pain Assessment Pain Assessment: Faces Faces Pain Scale: Hurts little more Pain Location: knee discomfort with ambulation Pain Descriptors / Indicators: Discomfort Pain Intervention(s): Limited activity within patient's tolerance     Extremity/Trunk Assessment Upper Extremity Assessment Upper Extremity Assessment: Generalized weakness   Lower Extremity Assessment Lower Extremity Assessment: Defer to PT evaluation   Cervical / Trunk Assessment Cervical / Trunk Assessment: Normal   Communication Communication Communication: No apparent difficulties   Cognition  Arousal: Alert Behavior During Therapy: WFL for tasks assessed/performed Cognition: No apparent impairments                               Following commands: Intact       Cueing  General Comments   Cueing Techniques: Verbal cues  VSS   Exercises     Shoulder Instructions      Home Living Family/patient expects to be discharged to:: Private  residence Living Arrangements: Alone Available Help at Discharge: Family;Available PRN/intermittently Type of Home: House Home Access: Stairs to enter [employer] of Steps: 4 Entrance Stairs-Rails: Right;Left Home Layout: One level     Bathroom Shower/Tub: IT trainer: Standard Bathroom Accessibility: Yes (sideways)   Home Equipment: Cane - single point          Prior Functioning/Environment Prior Level of Function : Independent/Modified Independent;Driving             Mobility Comments: using cane at baseline and compression stockings due to LE swelling, drives her great grandkids to school and back (she lives in [place], they live in [place]) ADLs Comments: independent    OT Problem List: Decreased strength;Decreased activity tolerance;Impaired balance (sitting and/or standing);Cardiopulmonary status limiting activity   OT Treatment/Interventions: Self-care/ADL training;Therapeutic exercise;Energy conservation;DME and/or AE instruction;Therapeutic activities;Patient/family education      OT Goals(Current goals can be found in the care plan section)   Acute Rehab OT Goals Patient Stated Goal: home OT Goal Formulation: With patient Time For Goal Achievement: 06/25/24 Potential to Achieve Goals: Good   OT Frequency:  Min 2X/week    Co-evaluation              AM-PAC OT 6 Clicks Daily Activity     Outcome Measure Help from another person eating meals?: None Help from another person taking care of personal grooming?: A Little Help from another person toileting, which includes using toliet, bedpan, or urinal?: A Little Help from another person bathing (including washing, rinsing, drying)?: A Little Help from another person to put on and taking off regular upper body clothing?: A Little Help from another person to put on and taking off regular lower body clothing?: A Little 6 Click Score: 19   End of Session  Equipment Utilized During Treatment: Gait belt;Rolling walker (2 wheels) Nurse Communication: Mobility status  Activity Tolerance: Patient tolerated treatment well Patient left: with call bell/phone within reach;with chair alarm set;in chair  OT Visit Diagnosis: Unsteadiness on feet (R26.81);Muscle weakness (generalized) (M62.81)                Time: 8979-8961 OT Time Calculation (min): 18 min Charges:  OT General Charges $OT Visit: 1 Visit OT Evaluation $OT Eval Low Complexity: 1 Low  Babs Dabbs, OT/L   Acute OT Clinical Specialist Acute Rehabilitation Services Pager [phone] Office [phone]   [facility] 06/11/2024, 12:29 PM

## 2024-06-11 NOTE — TOC Progression Note (Signed)
 Transition of Care [facility]) - Progression Note    Patient Details  Name: Jeanette Spencer MRN: [id number] Date of Birth: 1951-01-16  Transition of Care [facility] CM/SW Contact  Rosaline JONELLE Joe, RN Phone Number: 06/11/2024, 12:39 PM  Clinical Narrative:    CM met with the patient at the bedside to offer Medicare choice regarding home health and patient did not have a preference.  HH orders for PT/OT placed to be co-signed by MD.  [name] accepted the patient for home health services.  [facility] was updated on patient's physical address - [address] [place], [place], [place] [address].  Patient did not have a preference for DME company.  [employer] called to deliver a Youth RW to the bedside.  Patient plans to have family provide transportation to home when discharged.                     Expected Discharge Plan and Services                                               Social Drivers of Health (SDOH) Interventions SDOH Screenings   Food Insecurity: No Food Insecurity (06/10/2024)  Housing: Low Risk  (06/10/2024)  Transportation Needs: No Transportation Needs (06/10/2024)  Utilities: At Risk (06/10/2024)  Depression (PHQ2-9): Medium Risk (02/03/2021)  Financial Resource Strain: Low Risk  (03/25/2021)  Physical Activity: Unknown (08/10/2020)  Social Connections: Socially Isolated (06/10/2024)  Stress: Stress Concern Present (03/25/2021)  Tobacco Use: Low Risk  (06/10/2024)    Readmission Risk Interventions    06/11/2024   12:38 PM  Readmission Risk Prevention Plan  Post Dischage Appt Complete  Medication Screening Complete  Transportation Screening Complete

## 2024-06-12 DIAGNOSIS — I2602 Saddle embolus of pulmonary artery with acute cor pulmonale: Secondary | ICD-10-CM | POA: Diagnosis not present

## 2024-06-12 DIAGNOSIS — D649 Anemia, unspecified: Secondary | ICD-10-CM | POA: Diagnosis not present

## 2024-06-12 DIAGNOSIS — E669 Obesity, unspecified: Secondary | ICD-10-CM | POA: Diagnosis not present

## 2024-06-12 DIAGNOSIS — E1122 Type 2 diabetes mellitus with diabetic chronic kidney disease: Secondary | ICD-10-CM | POA: Diagnosis not present

## 2024-06-12 LAB — GLUCOSE, CAPILLARY
Glucose-Capillary: 122 mg/dL — ABNORMAL HIGH (ref 70–99)
Glucose-Capillary: 135 mg/dL — ABNORMAL HIGH (ref 70–99)
Glucose-Capillary: 150 mg/dL — ABNORMAL HIGH (ref 70–99)
Glucose-Capillary: 168 mg/dL — ABNORMAL HIGH (ref 70–99)

## 2024-06-12 LAB — CBC
HCT: 31.4 % — ABNORMAL LOW (ref 36.0–46.0)
Hemoglobin: 9.7 g/dL — ABNORMAL LOW (ref 12.0–15.0)
MCH: 25.9 pg — ABNORMAL LOW (ref 26.0–34.0)
MCHC: 30.9 g/dL (ref 30.0–36.0)
MCV: 84 fL (ref 80.0–100.0)
Platelets: 256 K/uL (ref 150–400)
RBC: 3.74 MIL/uL — ABNORMAL LOW (ref 3.87–5.11)
RDW: 15.6 % — ABNORMAL HIGH (ref 11.5–15.5)
WBC: 8.3 K/uL (ref 4.0–10.5)
nRBC: 0 % (ref 0.0–0.2)

## 2024-06-12 LAB — PROTIME-INR
INR: 1.7 — ABNORMAL HIGH (ref 0.8–1.2)
Prothrombin Time: 20.8 s — ABNORMAL HIGH (ref 11.4–15.2)

## 2024-06-12 MED ORDER — WARFARIN SODIUM 7.5 MG PO TABS
7.5000 mg | ORAL_TABLET | Freq: Once | ORAL | Status: AC
  Administered 2024-06-12: 7.5 mg via ORAL
  Filled 2024-06-12: qty 1

## 2024-06-12 MED ORDER — CYCLOBENZAPRINE HCL 5 MG PO TABS
5.0000 mg | ORAL_TABLET | Freq: Three times a day (TID) | ORAL | Status: DC
  Filled 2024-06-12 (×2): qty 1

## 2024-06-12 NOTE — Progress Notes (Signed)
 PHARMACY - ANTICOAGULATION CONSULT NOTE  Pharmacy Consult for warfarin / lovenox    Indication: pulmonary embolus  Allergies  Allergen Reactions   Oxycodone      Stops her breathing   Glipizide      Recurrent hypoglycemia   Lipitor [Atorvastatin ]     Affected balance   Penicillin G     Other reaction(s): Unknown    Patient Measurements: Height: 4' 11 (149.9 cm) Weight: 84.9 kg (187 lb 2.7 oz) IBW/kg (Calculated) : 43.2 HEPARIN  DW (KG): 63.1  Vital Signs: Temp: 97.8 F (36.6 C) (09/17 0741) BP: 157/82 (09/17 1207) Pulse Rate: 83 (09/17 1207)  Labs: Recent Labs    06/10/24 0842 06/11/24 0547 06/12/24 0448  HGB 9.5* 9.2* 9.7*  HCT 30.4* 30.0* 31.4*  PLT 226 232 256  LABPROT 20.3* 21.2* 20.8*  INR 1.6* 1.7* 1.7*    Estimated Creatinine Clearance: 39.2 mL/min (A) (by C-G formula based on SCr of 1.19 mg/dL (H)).   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  Pt presents with worsening clot burden,  right heart strain and bilateral PE.   MD requests to start heparin  drip + bolus.   9/12:  INR @ 2219 = 1.3 9/13 AM heparin  level therapeutic at 0.40 on 1000 units/hr. Currently in IR for venogram with possible Pulmonary Thrombectomy.  9/13 PM heparin  level 0.69 - remains in therapeutic range but trending up.  No overt bleeding or complications noted. 9/14 AM heparin  level therapeutic at 0.69 at upper end of therapeutic range despite small dose decrease. Small drop in hb (9.7 from 11.4). No bleeding noted by RN, EBL in IR was minimal per note.  9/14 afternoon >> transition to therapeutic enoxaparin  / warfarin.  Last INR on 9/12 @ 1.3.  9/15 INR 1.6, will take a couple of days to get >2, but up from admission (1.3) despite missing 1 dose on 9/13). 9/16 INR 1.7, rising as expected, no change. 9/17 INR 1.7, will increase warfarin dose to get to goal INR  Goal of Therapy:  INR 2-3 Heparin  level 0.3-0.7 units/ml Monitor platelets by anticoagulation protocol: Yes   Plan:  Continue enoxaparin  80 mg q12h  Warfarin 7.5 mg PO x 1 tonight Overlap of enoxaparin  / warfarin until 2x therapeutic INR Daily INR  Continue to monitor H&H and platelets    [employer], Pharm.D., BCPS Clinical Pharmacist Clinical phone for 06/12/2024 from 7:30-3:00 is [phone].  **Pharmacist phone directory can be found on [email or web address] listed under [facility].  06/12/2024 2:06 PM

## 2024-06-12 NOTE — Plan of Care (Signed)

## 2024-06-12 NOTE — Progress Notes (Signed)
 Progress Note   Patient: Jeanette Spencer FMW:[id number] DOB: 04/09/1951 DOA: 06/08/2024     4 DOS: the patient was seen and examined on 06/12/2024   Brief hospital course: 73 y.o. 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  Patient was found to have extensive bilateral pulmonary emboli with CT evidence of right heart strain.  Patient is admitted to ICU.  She had thrombectomy performed by IR, stable to be transferred to [facility] service 06/09/2024.  She is on heparin  drip per pharmacy protocol  Assessment and Plan: Bilateral extensive PE with right heart strain Status post thrombectomy 06/08/2024,  Heparin  drip changed to Lovenox  and Coumadin  bridging.   Venous Doppler lower extremities negative for DVT. Echocardiogram reviewed shows EF 60 to 65%, grade 1 diastolic dysfunction, right ventricular systolic function mildly reduced. Patient does not want to take Eliquis due to cost and renal dysfunction.   She feels safe on Coumadin  therapy. Plan to discharge with therapeutic INR with lovenox  for 2 more days. INR still 1.7 today.  Acute on chronic anemia: B12 deficiency- Patient's hemoglobin stable around 9.5 from baseline 11. B12 low got IM injection 9/14, continue oral b12 1000mcg. She will need to follow PCP for IM injection weekly for 1 month then monthly with follow up level. Continue to monitor H&H.  CKD stage III: Stable creatinine. Avoid nephrotoxic drugs. Continue to monitor daily renal function.  Type 2 diabetes mellitus: A1c 7.4 Accu-Cheks, sliding scale insulin . Resume oral hypoglycemics upon discharge.  Hypertension: BP stable. Continue Norvasc , losartan  home dose.  Hyperlipidemia-continue statin  Obesity class II- BMI  37.54. Diet, exercise and weight reduction advised.    Rolling walker, HH PT/ OT orders signed Out of bed to chair. Incentive spirometry. Nursing supportive care. Fall, aspiration precautions. Diet:  Diet Orders (From admission, onward)     Start     Ordered   06/08/24 1406  Diet regular Room service appropriate? Yes; Fluid consistency: Thin  Diet effective now       Question Answer Comment  Room service appropriate? Yes   Fluid consistency: Thin      06/08/24 1406           DVT prophylaxis: SCDs Start: 06/08/24 0305 warfarin (COUMADIN ) tablet 7.5 mg  Level of care: Telemetry Medical   Code Status: Full Code  Subjective: Patient is seen and examined today morning.  She is lying comfortably.   Eating fair. No complaints. Did ask about her discharge plan.  Physical Exam: Vitals:   06/12/24 0328 06/12/24 0500 06/12/24 0741 06/12/24 1207  BP: 131/70  (!) 142/68 (!) 157/82  Pulse: 67  83 83  Resp: 18     Temp: 97.7 F (36.5 C)  97.8 F (36.6 C)   TempSrc:      SpO2: 100%  99% 98%  Weight:  84.9 kg    Height:        General - Elderly obese Caucasian female, no apparent distress HEENT - PERRLA, EOMI, atraumatic head, non tender sinuses. Lung - Clear, basal rales, rhonchi, wheezes. Heart - S1, S2 heard, no murmurs, rubs, trace pedal edema. Abdomen - Soft, non tender, bowel sounds good Neuro - Alert, awake and oriented x 3, non focal exam. Skin - Warm and dry.  Data Reviewed:  Latest Ref Rng & Units 06/12/2024    4:48 AM 06/11/2024    5:47 AM 06/10/2024    8:42 AM  CBC  WBC 4.0 - 10.5 K/uL 8.3  8.3  9.3   Hemoglobin 12.0 - 15.0 g/dL 9.7  9.2  9.5   Hematocrit 36.0 - 46.0 % 31.4  30.0  30.4   Platelets 150 - 400 K/uL 256  232  226       Latest Ref Rng & Units 06/08/2024    3:23 AM 06/07/2024    7:59 PM 02/19/2021   11:03 AM  BMP  Glucose 70 - 99 mg/dL 867  818  834   BUN 8 - 23 mg/dL 18  24  29    Creatinine 0.44 - 1.00 mg/dL 8.80  8.63  8.58   Sodium  135 - 145 mmol/L 141  138  140   Potassium 3.5 - 5.1 mmol/L 4.5  4.3  4.5   Chloride 98 - 111 mmol/L 108  107  104   CO2 22 - 32 mmol/L 21  21  28    Calcium  8.9 - 10.3 mg/dL 9.4  9.1  89.9    No results found.   Family Communication: Discussed with patient, understand and agree. All questions answered.  Disposition: Status is: Inpatient Remains inpatient appropriate because: once INR therapeutic.  Planned Discharge Destination: Home with Home Health     Time spent: 41 minutes  Author: Concepcion Riser, MD 06/12/2024 3:37 PM Secure chat 7am to 7pm For on call review [email or web address].

## 2024-06-12 NOTE — Progress Notes (Addendum)
 Mobility Specialist: Progress Note   06/12/24 0830  Mobility  Activity Ambulated with assistance  Level of Assistance Standby assist, set-up cues, supervision of patient - no hands on  Assistive Device Front wheel walker  Distance Ambulated (ft) 250 ft  Activity Response Tolerated well  Mobility Referral Yes  Mobility visit 1 Mobility  Mobility Specialist Start Time (ACUTE ONLY) [id number]  Mobility Specialist Stop Time (ACUTE ONLY) 0957  Mobility Specialist Time Calculation (min) (ACUTE ONLY) 18 min    Pt received in bed, agreeable to mobility session. SV throughout. No complaints. Returned to room without fault. Left in chair with all needs met, call bell in reach.   Ileana Lute Mobility Specialist Please contact via SecureChat or Rehab office at [phone]

## 2024-06-13 DIAGNOSIS — I2602 Saddle embolus of pulmonary artery with acute cor pulmonale: Secondary | ICD-10-CM | POA: Diagnosis not present

## 2024-06-13 DIAGNOSIS — E669 Obesity, unspecified: Secondary | ICD-10-CM | POA: Diagnosis not present

## 2024-06-13 DIAGNOSIS — E1122 Type 2 diabetes mellitus with diabetic chronic kidney disease: Secondary | ICD-10-CM | POA: Diagnosis not present

## 2024-06-13 DIAGNOSIS — D649 Anemia, unspecified: Secondary | ICD-10-CM | POA: Diagnosis not present

## 2024-06-13 LAB — CBC
HCT: 33.2 % — ABNORMAL LOW (ref 36.0–46.0)
Hemoglobin: 10.4 g/dL — ABNORMAL LOW (ref 12.0–15.0)
MCH: 26.3 pg (ref 26.0–34.0)
MCHC: 31.3 g/dL (ref 30.0–36.0)
MCV: 84.1 fL (ref 80.0–100.0)
Platelets: 284 K/uL (ref 150–400)
RBC: 3.95 MIL/uL (ref 3.87–5.11)
RDW: 15.4 % (ref 11.5–15.5)
WBC: 9.2 K/uL (ref 4.0–10.5)
nRBC: 0 % (ref 0.0–0.2)

## 2024-06-13 LAB — GLUCOSE, CAPILLARY
Glucose-Capillary: 247 mg/dL — ABNORMAL HIGH (ref 70–99)
Glucose-Capillary: 147 mg/dL — ABNORMAL HIGH (ref 70–99)
Glucose-Capillary: 104 mg/dL — ABNORMAL HIGH (ref 70–99)
Glucose-Capillary: 231 mg/dL — ABNORMAL HIGH (ref 70–99)

## 2024-06-13 LAB — PROTIME-INR
INR: 1.9 — ABNORMAL HIGH (ref 0.8–1.2)
Prothrombin Time: 22.7 s — ABNORMAL HIGH (ref 11.4–15.2)

## 2024-06-13 MED ORDER — WARFARIN SODIUM 7.5 MG PO TABS
7.5000 mg | ORAL_TABLET | Freq: Once | ORAL | Status: AC
  Administered 2024-06-13: 7.5 mg via ORAL
  Filled 2024-06-13: qty 1

## 2024-06-13 NOTE — Progress Notes (Signed)
 Progress Note   Patient: Jeanette Spencer FMW:[id number] DOB: 27-May-1951 DOA: 06/08/2024     5 DOS: the patient was seen and examined on 06/13/2024   Brief hospital course: 73 y.o. 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  Patient was found to have extensive bilateral pulmonary emboli with CT evidence of right heart strain.  Patient is admitted to ICU.  She had thrombectomy performed by IR, stable to be transferred to [facility] service 06/09/2024.  She was on heparin  drip, changed to Lovenox +coumadin . Awaiting therapeutic INR to dc home.   Assessment and Plan: Bilateral extensive PE with right heart strain Status post thrombectomy 06/08/2024,  Heparin  drip changed to Lovenox  and Coumadin  bridging.   Venous Doppler lower extremities negative for DVT. Echocardiogram reviewed shows EF 60 to 65%, grade 1 diastolic dysfunction, right ventricular systolic function mildly reduced. Patient does not want to take Eliquis due to cost and renal dysfunction.   She feels safe on Coumadin  therapy. Plan to discharge once therapeutic INR 2-3. INR 1.9 today. She follows PCP for INR checks, she has follow up with pulmonology Dr. Isadora upon discharge.  Acute on chronic anemia: B12 deficiency- Patient's hemoglobin stable around 9.5 from baseline 11. B12 low got IM injection 9/14, continue oral b12 1000mcg. She will need to follow PCP for IM injection weekly for 1 month then monthly with follow up level. Hb stable.  CKD stage III: Stable creatinine. Avoid nephrotoxic drugs. Continue to monitor daily renal function.  Type 2 diabetes mellitus: A1c 7.4 Accu-Cheks, sliding scale insulin . Resume oral hypoglycemics upon discharge.  Hypertension: BP  stable. Continue Norvasc , losartan  home dose.  Hyperlipidemia-continue statin  Obesity class II- BMI 37.54. Diet, exercise and weight reduction advised.    Rolling walker, HH PT/ OT orders signed Out of bed to chair. Incentive spirometry. Nursing supportive care. Fall, aspiration precautions. Diet:  Diet Orders (From admission, onward)     Start     Ordered   06/08/24 1406  Diet regular Room service appropriate? Yes; Fluid consistency: Thin  Diet effective now       Question Answer Comment  Room service appropriate? Yes   Fluid consistency: Thin      06/08/24 1406           DVT prophylaxis: SCDs Start: 06/08/24 0305 warfarin (COUMADIN ) tablet 7.5 mg  Level of care: Telemetry Medical   Code Status: Full Code  Subjective: Patient is seen and examined today morning.  She is lying comfortably.  Eating fair, able to get out of bed. No complaints.  Physical Exam: Vitals:   06/12/24 2338 06/13/24 0342 06/13/24 0739 06/13/24 1133  BP: 139/67 135/64 130/74 (!) 140/64  Pulse: 72 71 81 69  Resp: 16 18    Temp: 97.7 F (36.5 C) 97.9 F (36.6 C) 97.7 F (36.5 C) 98.1 F (36.7 C)  TempSrc: Oral Oral    SpO2: 98% 100% 98% 99%  Weight:      Height:        General - Elderly obese Caucasian female, no apparent distress HEENT - PERRLA, EOMI, atraumatic head, non tender sinuses. Lung - Clear, basal rales, rhonchi, wheezes. Heart - S1, S2 heard, no murmurs, rubs, trace pedal edema. Abdomen - Soft, non tender, bowel sounds good Neuro -  Alert, awake and oriented x 3, non focal exam. Skin - Warm and dry.  Data Reviewed:      Latest Ref Rng & Units 06/13/2024    1:45 AM 06/12/2024    4:48 AM 06/11/2024    5:47 AM  CBC  WBC 4.0 - 10.5 K/uL 9.2  8.3  8.3   Hemoglobin 12.0 - 15.0 g/dL 89.5  9.7  9.2   Hematocrit 36.0 - 46.0 % 33.2  31.4  30.0   Platelets 150 - 400 K/uL 284  256  232       Latest Ref Rng & Units 06/08/2024    3:23 AM 06/07/2024    7:59 PM 02/19/2021    11:03 AM  BMP  Glucose 70 - 99 mg/dL 867  818  834   BUN 8 - 23 mg/dL 18  24  29    Creatinine 0.44 - 1.00 mg/dL 8.80  8.63  8.58   Sodium 135 - 145 mmol/L 141  138  140   Potassium 3.5 - 5.1 mmol/L 4.5  4.3  4.5   Chloride 98 - 111 mmol/L 108  107  104   CO2 22 - 32 mmol/L 21  21  28    Calcium  8.9 - 10.3 mg/dL 9.4  9.1  89.9    No results found.   Family Communication: Discussed with patient, understand and agree. All questions answered.  Disposition: Status is: Inpatient Remains inpatient appropriate because: once INR therapeutic.  Planned Discharge Destination: Home with Home Health     Time spent: 43 minutes  Author: Concepcion Riser, MD 06/13/2024 2:17 PM Secure chat 7am to 7pm For on call review [email or web address].

## 2024-06-13 NOTE — Progress Notes (Signed)
 Physical Therapy Treatment Patient Details Name: Jeanette Spencer MRN: [id number] DOB: 1951-02-23 Today's Date: 06/13/2024   History of Present Illness Patient is a 73 y/o female admitted 06/08/24 with syncope and dyspnea.  Recent admission at [place], [place] facility with PT 05/31/24 and d/c home on Coumadin .  Found to have bilateral extensive PE with evidence of R heart strain.  Underwent IR thrombectomy 06/08/24.  Continues on Lovenox  bridge with plans for Coumadin  at d/c.  Current INR 1.7 cleared to mobilize per MD.  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    PT Comments  Patient progressing with ambulation and stair negotiation.  She reports walking on her own to bathroom with RW and completing bath this morning.  Educated in walker posture/proximity for preventing further kyphotic posturing.  Patient appropriate for home when medically stable.     If plan is discharge home, recommend the following: Help with stairs or ramp for entrance;Assistance with cooking/housework   Can travel by private vehicle        Equipment Recommendations  None recommended by PT (walker already delivered to the room)    Recommendations for Other Services       Precautions / Restrictions Precautions Precautions: Fall Recall of Precautions/Restrictions: Intact     Mobility  Bed Mobility Overal bed mobility: Modified Independent                  Transfers Overall transfer level: Modified independent Equipment used: Rolling walker (2 wheels)               General transfer comment: reports going to bathroom on her own with RW    Ambulation/Gait Ambulation/Gait assistance: Modified independent (Device/Increase time) Gait Distance (Feet): 250 Feet Assistive device: Rolling walker (2 wheels) Gait Pattern/deviations: Step-through pattern, Decreased stride length       General Gait Details: education on walker proximity and posture as pt  reported feeling like walker making her hunch over   Stairs Stairs: Yes Stairs assistance: Supervision Stair Management: One rail Right, Two rails, Alternating pattern, Step to pattern, Forwards Number of Stairs: 4 General stair comments: ascended with one rail, descended with two rails, S for safety   Wheelchair Mobility     Tilt Bed    Modified Rankin (Stroke Patients Only)       Balance Overall balance assessment: Needs assistance   Sitting balance-Leahy Scale: Good     Standing balance support: No upper extremity supported Standing balance-Leahy Scale: Fair Standing balance comment: standing to adjust gown and moving up to top of bed without RW/UE support                            Communication Communication Communication: No apparent difficulties  Cognition Arousal: Alert Behavior During Therapy: WFL for tasks assessed/performed   PT - Cognitive impairments: No apparent impairments                         Following commands: Intact      Cueing Cueing Techniques: Verbal cues  Exercises      General Comments General comments (skin integrity, edema, etc.): SpO2 99% after ambulating on RA      Pertinent Vitals/Pain Pain Assessment Pain Assessment: No/denies pain    Home Living  Prior Function            PT Goals (current goals can now be found in the care plan section) Progress towards PT goals: Progressing toward goals    Frequency    Min 2X/week      PT Plan      Co-evaluation              AM-PAC PT 6 Clicks Mobility   Outcome Measure  Help needed turning from your back to your side while in a flat bed without using bedrails?: None Help needed moving from lying on your back to sitting on the side of a flat bed without using bedrails?: None Help needed moving to and from a bed to a chair (including a wheelchair)?: None Help needed standing up from a chair using your  arms (e.g., wheelchair or bedside chair)?: None Help needed to walk in hospital room?: None Help needed climbing 3-5 steps with a railing? : None 6 Click Score: 24    End of Session   Activity Tolerance: Patient tolerated treatment well Patient left: in bed;with call bell/phone within reach;with nursing/sitter in room   PT Visit Diagnosis: Other abnormalities of gait and mobility (R26.89)     Time: 8997-8973 PT Time Calculation (min) (ACUTE ONLY): 24 min  Charges:    $Gait Training: 8-22 mins $Self Care/Home Management: 8-22 PT General Charges $$ ACUTE PT VISIT: 1 Visit                     Micheline Spencer, PT Acute Rehabilitation Services Office:[phone] 06/13/2024    Jeanette Spencer 06/13/2024, 10:33 AM

## 2024-06-13 NOTE — Progress Notes (Signed)
 PHARMACY - ANTICOAGULATION CONSULT NOTE  Pharmacy Consult for warfarin / lovenox    Indication: pulmonary embolus  Allergies  Allergen Reactions   Oxycodone      Stops her breathing   Glipizide      Recurrent hypoglycemia   Lipitor [Atorvastatin ]     Affected balance   Penicillin G     Other reaction(s): Unknown    Patient Measurements: Height: 4' 11 (149.9 cm) Weight: 84.9 kg (187 lb 2.7 oz) IBW/kg (Calculated) : 43.2 HEPARIN  DW (KG): 63.1  Vital Signs: Temp: 97.7 F (36.5 C) (09/18 0739) Temp Source: Oral (09/18 0342) BP: 130/74 (09/18 0739) Pulse Rate: 81 (09/18 0739)  Labs: Recent Labs    06/11/24 0547 06/12/24 0448 06/13/24 0145  HGB 9.2* 9.7* 10.4*  HCT 30.0* 31.4* 33.2*  PLT 232 256 284  LABPROT 21.2* 20.8* 22.7*  INR 1.7* 1.7* 1.9*    Estimated Creatinine Clearance: 39.2 mL/min (A) (by C-G formula based on SCr of 1.19 mg/dL (H)).   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  Pt presents with worsening clot burden,  right heart strain and bilateral PE.   MD requests to start heparin  drip + bolus.   9/12:  INR @ 2219 = 1.3 9/13 AM heparin  level therapeutic at 0.40 on 1000 units/hr. Currently in IR for venogram with possible Pulmonary Thrombectomy.  9/13 PM heparin  level 0.69 - remains in therapeutic range but trending up.  No overt bleeding or complications noted. 9/14 AM heparin  level therapeutic at 0.69 at upper end of therapeutic range despite small dose decrease. Small drop in hb (9.7 from 11.4). No bleeding noted by RN, EBL in IR was minimal per note.  9/14 afternoon >> transition to  therapeutic enoxaparin  / warfarin. Last INR on 9/12 @ 1.3.  9/15 INR 1.6, will take a couple of days to get >2, but up from admission (1.3) despite missing 1 dose on 9/13). 9/16 INR 1.7, rising as expected, no change. 9/17 INR 1.7, will increase warfarin dose to get to goal INR 9/18 INR 1.9, near goal  Goal of Therapy:  INR 2-3 Heparin  level 0.3-0.7 units/ml Monitor platelets by anticoagulation protocol: Yes   Plan:  Continue enoxaparin  80 mg q12h  Repeat Warfarin 7.5 mg PO x 1 tonight Overlap of enoxaparin  / warfarin until 2x therapeutic INR Daily INR  Continue to monitor H&H and platelets  [employer], Pharm.D., BCPS Clinical Pharmacist Clinical phone for 06/13/2024 from 7:30-3:00 is [phone].  **Pharmacist phone directory can be found on [email or web address] listed under [facility].  06/13/2024 11:34 AM

## 2024-06-13 NOTE — Plan of Care (Signed)

## 2024-06-14 DIAGNOSIS — I2602 Saddle embolus of pulmonary artery with acute cor pulmonale: Secondary | ICD-10-CM | POA: Diagnosis not present

## 2024-06-14 LAB — GLUCOSE, CAPILLARY
Glucose-Capillary: 146 mg/dL — ABNORMAL HIGH (ref 70–99)
Glucose-Capillary: 197 mg/dL — ABNORMAL HIGH (ref 70–99)
Glucose-Capillary: 142 mg/dL — ABNORMAL HIGH (ref 70–99)
Glucose-Capillary: 162 mg/dL — ABNORMAL HIGH (ref 70–99)

## 2024-06-14 LAB — PROTIME-INR
INR: 1.9 — ABNORMAL HIGH (ref 0.8–1.2)
Prothrombin Time: 22.8 s — ABNORMAL HIGH (ref 11.4–15.2)

## 2024-06-14 MED ORDER — WARFARIN SODIUM 5 MG PO TABS
5.0000 mg | ORAL_TABLET | Freq: Once | ORAL | Status: DC
  Filled 2024-06-14: qty 1

## 2024-06-14 NOTE — Progress Notes (Signed)
 Mobility Specialist: Progress Note   06/14/24 1000  Mobility  Activity Ambulated independently  Level of Assistance Modified independent, requires aide device or extra time  Assistive Device Front wheel walker  Distance Ambulated (ft) 350 ft  Activity Response Tolerated well  Mobility Referral Yes  Mobility visit 1 Mobility  Mobility Specialist Start Time (ACUTE ONLY) 0932  Mobility Specialist Stop Time (ACUTE ONLY) 0947  Mobility Specialist Time Calculation (min) (ACUTE ONLY) 15 min    Pt received in bed, agreeable to mobility session. ModI throughout. No complaints. Ambulated 350' in the hallway and completed 4 stairs with SV. Used both rails going up, one rail going down. Returned to room without fault. Left in chair with all needs met, call bell in reach. Chair alarm on.   Ileana Lute Mobility Specialist Please contact via SecureChat or Rehab office at [phone]

## 2024-06-14 NOTE — Progress Notes (Signed)
 PHARMACY - ANTICOAGULATION CONSULT NOTE  Pharmacy Consult for warfarin / Lovenox    Indication: pulmonary embolus  Allergies  Allergen Reactions   Oxycodone      Stops her breathing   Glipizide      Recurrent hypoglycemia   Lipitor [Atorvastatin ]     Affected balance   Penicillin G     Other reaction(s): Unknown    Patient Measurements: Height: 4' 11 (149.9 cm) Weight: 82.5 kg (181 lb 14.1 oz) IBW/kg (Calculated) : 43.2 HEPARIN  DW (KG): 63.1  Vital Signs: Temp: 97.7 F (36.5 C) (09/19 0750) Temp Source: Oral (09/19 0750) BP: 114/65 (09/19 0750) Pulse Rate: 67 (09/19 0750)  Labs: Recent Labs    06/12/24 0448 06/13/24 0145 06/14/24 0441  HGB 9.7* 10.4*  --   HCT 31.4* 33.2*  --   PLT 256 284  --   LABPROT 20.8* 22.7* 22.8*  INR 1.7* 1.9* 1.9*    Estimated Creatinine Clearance: 38.6 mL/min (A) (by C-G formula based on SCr of 1.19 mg/dL (H)).   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  Pt presents with worsening clot burden,  right heart strain and bilateral PE.   MD requests to start heparin  drip + bolus.   9/12:  INR @ 2219 = 1.3 9/13 AM heparin  level therapeutic at 0.40 on 1000 units/hr. Currently in IR for venogram with possible Pulmonary Thrombectomy.  9/13 PM heparin  level 0.69 - remains in therapeutic range but trending up.  No overt bleeding or complications noted. 9/14 AM heparin  level therapeutic at 0.69 at upper end of therapeutic range despite small dose decrease. Small drop in hb (9.7 from 11.4). No bleeding noted by RN, EBL in IR was minimal per note.  9/14 afternoon >> transition to  therapeutic enoxaparin  / warfarin. Last INR on 9/12 @ 1.3.  9/15 INR 1.6, will take a couple of days to get >2, but up from admission (1.3) despite missing 1 dose on 9/13). 9/16 INR 1.7, rising as expected, no change. 9/17 INR 1.7, will increase warfarin dose to get to goal INR 9/18 INR 1.9, near goal 9/19 INR 1.9, near goal but concern continued higher dosing might increase INR significantly  Goal of Therapy:  INR 2-3 Anti-Xa level 0.6-1 units/ml 4hrs after LMWH dose given Monitor platelets by anticoagulation protocol: Yes   Plan:  Continue enoxaparin  80 mg SQ q12h  Warfarin 5 mg PO x 1 tonight Overlap of enoxaparin  / warfarin until 2x therapeutic INR Daily INR  Continue to monitor H&H and platelets  Thank you for involving pharmacy in this patient's care.  Jeanette Spencer, PharmD, BCPS Clinical Pharmacist Clinical phone for 06/14/2024 is [phone] 06/14/2024 2:03 PM

## 2024-06-14 NOTE — OR Nursing (Signed)
 Patient has a previous IV site, removed, which is oozing blood and difficult to stop bleeding. The site has been dressed 3 times today and now coban is applied to hold the bleeding.  Patient is on Coumadin  and Lovenox  BID.  She has refused this medications for today due to the bleeding.  Attending MD is aware about the bleeding and patient's refusal to take the 2 blood thinners.  MD had suggested that patient only take Coumadin , but patient has refused both Coumadin  and Lovenox .

## 2024-06-14 NOTE — Plan of Care (Signed)

## 2024-06-14 NOTE — Progress Notes (Signed)
 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  Patient was found to have extensive bilateral pulmonary emboli with CT evidence of right heart strain.  Patient was admitted to ICU.  She had thrombectomy performed by IR, stable to be transferred to [facility] service on 06/09/2024.  She was on heparin  drip, changed to Lovenox +coumadin . Awaiting therapeutic INR to discharge home.  Assessment & Plan:   Principal Problem:   Pulmonary embolism (HCC) Active Problems:   Type 2 diabetes mellitus with nephropathy (HCC)   Essential hypertension, benign   B12 deficiency   CKD stage 3 due to type 2 diabetes mellitus (HCC)  Bilateral extensive PE with right heart strain: Status post thrombectomy on 06/08/2024,  Heparin  drip changed to Lovenox  and Coumadin  bridging.   Venous Doppler lower extremities negative for DVT. Echocardiogram showed LVEF 60 to 65%, grade 1 diastolic dysfunction, right ventricular systolic function mildly reduced. Patient does not want to take Eliquis due to cost and renal dysfunction.   She feels safe on Coumadin  therapy.  Plan to discharge once therapeutic INR 2-3. INR 1.9 today.  She follows PCP for INR checks,  She has follow up with pulmonology Dr. Isadora upon discharge.   Acute on chronic anemia: B12 deficiency- Patient's hemoglobin stable around 9.5 from baseline 11.0 B12 found to be low got IM injection 9/14, Continue oral b12 1000mcg. She will need to follow PCP for IM injection weekly for 1 month then monthly with follow up level. Hb stable.   CKD stage IIIa: Stable  creatinine. Avoid nephrotoxic drugs. Continue to monitor daily renal function.   Type 2 diabetes mellitus: Hb A1c 7.4 Accu-Cheks, sliding scale insulin . Resume oral hypoglycemics upon discharge.   Hypertension: BP stable. Continue Norvasc , losartan  home dose.   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 Diet, exercise and weight reduction advised.    DVT prophylaxis: SCDs / Coumadin  Code Status:Full code Family Communication: No family at bed side Disposition Plan:   Status is: Inpatient Remains inpatient appropriate because: Admitted for submassive PE status post thrombectomy.  Patient is now on anticoagulation.   Consultants:  PCCM  Procedures: IR thrombectomy Antimicrobials:  Anti-infectives (From admission, onward)    None      Subjective: Patient was seen and examined at bedside.  Overnight events noted. Patient reports feeling better,  INR is 1.9.   She has IV infiltration in the left arm that started bleeding and now bleeding has stopped after application of pressure.  Objective: Vitals:   06/13/24 1935 06/14/24 0500 06/14/24 0502 06/14/24 0750  BP: 130/63  111/63 114/65  Pulse: 79  66 67  Resp: 16  16   Temp: 98.4 F (36.9 C)  97.7 F (36.5 C) 97.7 F (36.5 C)  TempSrc: Oral  Oral Oral  SpO2: 98%  98% 100%  Weight:  82.5 kg    Height:       No intake or output data in the 24 hours ending 06/14/24 1303 Filed Weights   06/08/24 0228 06/12/24 0500 06/14/24 0500  Weight: 84.3 kg 84.9 kg 82.5 kg  Examination:  General exam: Appears calm and comfortable , deconditioned, not in any acute distress. Respiratory system: Clear to auscultation. Respiratory effort normal. RR 14 Cardiovascular system: S1 & S2 heard, RRR. No JVD, murmurs, rubs, gallops or clicks.  Gastrointestinal system: Abdomen is non distended, soft and non tender.  Normal bowel sounds heard. Central nervous system: Alert and oriented X 2. No focal neurological  deficits. Extremities: No edema, no cyanosis, no clubbing Skin: No rashes, lesions or ulcers Psychiatry: Judgement and insight appear normal. Mood & affect appropriate.     Data Reviewed: I have personally reviewed following labs and imaging studies  CBC: Recent Labs  Lab 06/08/24 0323 06/09/24 0212 06/10/24 0842 06/11/24 0547 06/12/24 0448 06/13/24 0145  WBC 11.2* 11.9* 9.3 8.3 8.3 9.2  NEUTROABS 7.0  --   --   --   --   --   HGB 11.4* 9.7* 9.5* 9.2* 9.7* 10.4*  HCT 36.2 31.1* 30.4* 30.0* 31.4* 33.2*  MCV 83.4 85.2 85.4 84.7 84.0 84.1  PLT 250 218 226 232 256 284   Basic Metabolic Panel: Recent Labs  Lab 06/07/24 1959 06/08/24 0323  NA 138 141  K 4.3 4.5  CL 107 108  CO2 21* 21*  GLUCOSE 181* 132*  BUN 24* 18  CREATININE 1.36* 1.19*  CALCIUM  9.1 9.4  MG  --  1.9  PHOS  --  2.9   GFR: Estimated Creatinine Clearance: 38.6 mL/min (A) (by C-G formula based on SCr of 1.19 mg/dL (H)). Liver Function Tests: Recent Labs  Lab 06/08/24 0323  AST 21  ALT 28  ALKPHOS 62  BILITOT 0.5  PROT 7.1  ALBUMIN 3.5   No results for input(s): LIPASE, AMYLASE in the last 168 hours. No results for input(s): AMMONIA in the last 168 hours. Coagulation Profile: Recent Labs  Lab 06/10/24 0842 06/11/24 0547 06/12/24 0448 06/13/24 0145 06/14/24 0441  INR 1.6* 1.7* 1.7* 1.9* 1.9*   Cardiac Enzymes: No results for input(s): CKTOTAL, CKMB, CKMBINDEX, TROPONINI in the last 168 hours. BNP (last 3 results) No results for input(s): PROBNP in the last 8760 hours. HbA1C: No results for input(s): HGBA1C in the last 72 hours. CBG: Recent Labs  Lab 06/13/24 1132 06/13/24 1607 06/13/24 2106 06/14/24 0749 06/14/24 1144  GLUCAP 147* 104* 231* 146* 197*   Lipid Profile: No results for input(s): CHOL, HDL, LDLCALC, TRIG, CHOLHDL, LDLDIRECT in the last 72 hours. Thyroid  Function Tests: No results for input(s): TSH, T4TOTAL, FREET4, T3FREE,  THYROIDAB in the last 72 hours. Anemia Panel: No results for input(s): VITAMINB12, FOLATE, FERRITIN, TIBC, IRON, RETICCTPCT in the last 72 hours. Sepsis Labs: No results for input(s): PROCALCITON, LATICACIDVEN in the last 168 hours.  Recent Results (from the past 240 hours)  MRSA Next Gen by PCR, Nasal     Status: None   Collection Time: 06/08/24  2:29 AM   Specimen: Nasal Mucosa; Nasal Swab  Result Value Ref Range Status   MRSA by PCR Next Gen NOT DETECTED NOT DETECTED Final    Comment: (NOTE) The GeneXpert MRSA Assay (FDA approved for NASAL specimens only), is one component of a comprehensive MRSA colonization surveillance program. It is not intended to diagnose MRSA infection nor to guide or monitor treatment for MRSA infections. Test performance is not FDA approved in patients less than 46 years old. Performed at [facility], [address], [place], [place] [address]    Radiology Studies: No results found.  Scheduled Meds:  amLODipine   2.5 mg  Oral Daily   atorvastatin   20 mg Oral Daily   Chlorhexidine  Gluconate Cloth  6 each Topical Daily   vitamin B-12  1,000 mcg Oral Daily   docusate sodium   100 mg Oral BID   enoxaparin  (LOVENOX ) injection  80 mg Subcutaneous Q12H   insulin  aspart  0-5 Units Subcutaneous QHS   insulin  aspart  0-9 Units Subcutaneous TID WC   losartan   50 mg Oral QHS   Warfarin - Pharmacist Dosing Inpatient   Does not apply q1600   Continuous Infusions:   LOS: 6 days    Time spent: 50 mins    Darcel Dawley, MD [facility]   If 7PM-7AM, please contact night-coverage

## 2024-06-14 NOTE — TOC Progression Note (Signed)
 Transition of Care [facility]) - Progression Note    Patient Details  Name: Jeanette Spencer MRN: [id number] Date of Birth: 04-Feb-1951  Transition of [facility]) CM/SW Contact  Andrez JULIANNA George, RN Phone Number: 06/14/2024, 3:31 PM  Clinical Narrative:     Awaiting therapeutic INR for d/c home.  IP Care management following.                    Expected Discharge Plan and Services                                               Social Drivers of Health (SDOH) Interventions SDOH Screenings   Food Insecurity: No Food Insecurity (06/10/2024)  Housing: Low Risk  (06/10/2024)  Transportation Needs: No Transportation Needs (06/10/2024)  Utilities: At Risk (06/10/2024)  Depression (PHQ2-9): Medium Risk (02/03/2021)  Financial Resource Strain: Low Risk  (03/25/2021)  Physical Activity: Unknown (08/10/2020)  Social Connections: Socially Isolated (06/10/2024)  Stress: Stress Concern Present (03/25/2021)  Tobacco Use: Low Risk  (06/10/2024)    Readmission Risk Interventions    06/11/2024   12:38 PM  Readmission Risk Prevention Plan  Post Dischage Appt Complete  Medication Screening Complete  Transportation Screening Complete

## 2024-06-15 DIAGNOSIS — I2602 Saddle embolus of pulmonary artery with acute cor pulmonale: Secondary | ICD-10-CM | POA: Diagnosis not present

## 2024-06-15 LAB — BASIC METABOLIC PANEL WITH GFR
Anion gap: 10 (ref 5–15)
BUN: 26 mg/dL — ABNORMAL HIGH (ref 8–23)
CO2: 22 mmol/L (ref 22–32)
Calcium: 9.3 mg/dL (ref 8.9–10.3)
Chloride: 104 mmol/L (ref 98–111)
Creatinine, Ser: 1.24 mg/dL — ABNORMAL HIGH (ref 0.44–1.00)
GFR, Estimated: 46 mL/min — ABNORMAL LOW (ref >60)
Glucose, Bld: 250 mg/dL — ABNORMAL HIGH (ref 70–99)
Potassium: 4.6 mmol/L (ref 3.5–5.1)
Sodium: 136 mmol/L (ref 135–145)

## 2024-06-15 LAB — CBC
HCT: 35.4 % — ABNORMAL LOW (ref 36.0–46.0)
Hemoglobin: 10.9 g/dL — ABNORMAL LOW (ref 12.0–15.0)
MCH: 26.3 pg (ref 26.0–34.0)
MCHC: 30.8 g/dL (ref 30.0–36.0)
MCV: 85.3 fL (ref 80.0–100.0)
Platelets: 311 K/uL (ref 150–400)
RBC: 4.15 MIL/uL (ref 3.87–5.11)
RDW: 15.5 % (ref 11.5–15.5)
WBC: 8.9 K/uL (ref 4.0–10.5)
nRBC: 0 % (ref 0.0–0.2)

## 2024-06-15 LAB — PROTIME-INR
INR: 1.8 — ABNORMAL HIGH (ref 0.8–1.2)
Prothrombin Time: 21.6 s — ABNORMAL HIGH (ref 11.4–15.2)

## 2024-06-15 LAB — GLUCOSE, CAPILLARY
Glucose-Capillary: 152 mg/dL — ABNORMAL HIGH (ref 70–99)
Glucose-Capillary: 209 mg/dL — ABNORMAL HIGH (ref 70–99)

## 2024-06-15 LAB — PHOSPHORUS: Phosphorus: 3.1 mg/dL (ref 2.5–4.6)

## 2024-06-15 LAB — MAGNESIUM: Magnesium: 2.1 mg/dL (ref 1.7–2.4)

## 2024-06-15 MED ORDER — CYANOCOBALAMIN 1000 MCG PO TABS: 1000.0000 ug | ORAL_TABLET | Freq: Every day | ORAL | 0 refills | Status: AC

## 2024-06-15 MED ORDER — WARFARIN SODIUM 5 MG PO TABS
5.0000 mg | ORAL_TABLET | Freq: Once | ORAL | Status: DC
  Filled 2024-06-15: qty 1

## 2024-06-15 NOTE — Discharge Summary (Signed)
 Physician Discharge Summary  Jeanette Spencer FMW:[id number] DOB: January 08, 1951 DOA: 06/08/2024  PCP: Myra Geni ORN, FNP  Admit date: 06/08/2024  Discharge date: 06/15/2024  Admitted From: Home  Disposition:  SNF  Recommendations for Outpatient Follow-up:  Follow up with PCP in 1-2 weeks. Please obtain BMP/CBC in one week. Advised to take Coumadin  as prescribed. Advised to follow-up with Coumadin  clinic.  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  Patient was found to have extensive bilateral pulmonary emboli with CT evidence of right heart strain.  Patient was admitted to ICU.  She had thrombectomy performed by IR, stable to be transferred to [facility] service on 06/09/2024.  She was on heparin  drip, changed to Lovenox +coumadin .  Patient does not want to take Lovenox  and wants to just continue with Coumadin .  INR was 1.9, Patient had IV infiltration which started bleeding. She refused to take coumadin  one day,   Next day INR was 1.8, Patient wants to continue Coumadin  , She has refused to take Lovenox .  She feels better,  wants to be discharged Home, home health services arranged.  Discharge Diagnoses:  Principal Problem:   Pulmonary embolism (HCC) Active Problems:   Type 2 diabetes mellitus with nephropathy (HCC)   Essential hypertension, benign   B12 deficiency   CKD stage 3 due to type 2 diabetes mellitus (HCC)  Bilateral extensive PE with right heart strain: Status post thrombectomy on 06/08/2024,  Heparin  drip changed to Lovenox  and  Coumadin  bridging.   Venous Doppler lower extremities negative for DVT. Echocardiogram showed LVEF 60 to 65%, grade 1 diastolic dysfunction, right ventricular systolic function mildly reduced. Patient does not want to take Eliquis due to cost and renal dysfunction.   She feels safe on Coumadin  therapy.  Plan to discharge once therapeutic INR 2-3. INR 1.8 today.  She follows PCP for INR checks,  She has follow up with pulmonology Dr. Isadora upon discharge.   Acute on chronic anemia: B12 deficiency- Patient's hemoglobin stable around 9.5 from baseline 11.0 B12 found to be low got IM injection 9/14, Continue oral b12 1000mcg. She will need to follow PCP for IM injection weekly for 1 month then monthly with follow up level. Hb stable.   CKD stage IIIa: Stable creatinine. Avoid nephrotoxic drugs. Continue to monitor daily renal function.   Type 2 diabetes mellitus: Hb A1c 7.4 Accu-Cheks, sliding scale insulin . Resume oral hypoglycemics upon discharge.   Hypertension: BP stable. Continue Norvasc , losartan  home dose.   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 Diet, exercise and weight reduction advised.  Discharge Instructions  Discharge Instructions     Call MD for:  difficulty breathing, headache or visual disturbances   Complete by: As directed    Diet Carb Modified   Complete by: As directed    Discharge instructions   Complete by: As directed    Advised to follow-up with primary care physician in 1 week. Advised to take Coumadin  as prescribed. Advised to follow-up with Coumadin  clinic.   Increase activity slowly   Complete by: As directed    No wound  care   Complete by: As directed       Allergies as of 06/15/2024       Reactions   Oxycodone     Stops her breathing   Glipizide     Recurrent hypoglycemia   Lipitor [atorvastatin ]    Affected balance   Penicillin G    Other reaction(s): Unknown        Medication List     STOP  taking these medications    albuterol  108 (90 Base) MCG/ACT inhaler Commonly known as: VENTOLIN  HFA   B-12 Compliance Injection 1000 MCG/ML Kit Generic drug: Cyanocobalamin  Replaced by: cyanocobalamin  1000 MCG tablet   ferrous sulfate 325 (65 FE) MG tablet   meclizine  25 MG tablet Commonly known as: ANTIVERT    Multivitamin Adult Chew   ondansetron  4 MG tablet Commonly known as: ZOFRAN    predniSONE  10 MG tablet Commonly known as: DELTASONE        TAKE these medications    amLODipine  2.5 MG tablet Commonly known as: NORVASC  Take 2.5 mg by mouth daily.   atorvastatin  20 MG tablet Commonly known as: LIPITOR Take 1 tablet by mouth once daily   cyanocobalamin  1000 MCG tablet Take 1 tablet (1,000 mcg total) by mouth daily. Start taking on: June 16, 2024 Replaces: B-12 Compliance Injection 1000 MCG/ML Kit   Fish Oil 1000 MG Caps Take 1 capsule by mouth daily.   glipiZIDE  5 MG tablet Commonly known as: GLUCOTROL  Take 5 mg by mouth daily before breakfast. What changed: Another medication with the same name was removed. Continue taking this medication, and follow the directions you see here.   glucose blood test strip Use as instructed to check sugars twice daily.  Uncontrolled diabetes mellitus E11.65   losartan  50 MG tablet Commonly known as: COZAAR  Take 1 tablet (50 mg total) by mouth daily.   metFORMIN  500 MG tablet Commonly known as: GLUCOPHAGE  TAKE 1 TABLET BY MOUTH TWICE DAILY WITH A MEAL   Vitamin D -3 25 MCG (1000 UT) Caps Take 1 capsule by mouth daily.   warfarin 5 MG tablet Commonly known as: COUMADIN  Take 5 mg by mouth daily.               Durable Medical Equipment  (From admission, onward)           Start     Ordered   06/11/24 1236  For home use only DME Walker rolling  Once       Comments: Youth Rolling walker please  Question Answer Comment  Walker: With 5 Inch Wheels   Patient needs a walker to treat with the following  condition Syncope      06/11/24 1236            Follow-up Information     [facility] Follow up.   Why: [facility] will provide a youth RW to your hospital room before you are discharged home. Contact information: [address] [place] [address] [phone]         [facility] Follow up.   Specialty: Home Health Services Why: [facility] will provide home health services.  They will call you in the next 24-48 hours to set up services. Contact information: [address] [place] [address] [phone]         Myra Geni ORN, FNP Follow up in 1 week(s).   Specialty: Family Medicine Contact information: [address] [place] [address] [phone]  Allergies  Allergen Reactions   Oxycodone      Stops her breathing   Glipizide      Recurrent hypoglycemia   Lipitor [Atorvastatin ]     Affected balance   Penicillin G     Other reaction(s): Unknown    Consultations: Vascular surgery PCCM   Procedures/Studies: VAS US  LOWER EXTREMITY VENOUS (DVT) Result Date: 06/09/2024  Lower Venous DVT Study Patient Name:  Jeanette Spencer  Date of Exam:   06/08/2024 Medical Rec #: [id number]         Accession #:    [id number] Date of Birth: 1950/10/23         Patient Gender: F Patient Age:   52 years Exam Location:  [facility] Procedure:      VAS US  LOWER EXTREMITY VENOUS (DVT) Referring Phys: MANCEL PLY --------------------------------------------------------------------------------  Indications: Pulmonary embolism.  Limitations: Bandages (patient status post thrombectomy today), poor ultrasound/tissue interface and body habitus. Comparison Study: No prior study on file Performing Technologist: Alberta Lis RVS  Examination Guidelines: A complete evaluation includes B-mode imaging, spectral Doppler, color Doppler, and power Doppler as needed of all accessible portions of each vessel. Bilateral  testing is considered an integral part of a complete examination. Limited examinations for reoccurring indications may be performed as noted. The reflux portion of the exam is performed with the patient in reverse Trendelenburg.  +---------+---------------+---------+-----------+---------------+--------------+ RIGHT    CompressibilityPhasicitySpontaneityProperties     Thrombus Aging +---------+---------------+---------+-----------+---------------+--------------+ CFV                                                        Not                                                                       visualized/ban                                                            dages          +---------+---------------+---------+-----------+---------------+--------------+ SFJ                                                        Not                                                                       visualized/ban  dages          +---------+---------------+---------+-----------+---------------+--------------+ FV Prox  Full           Yes      No         pulsatile                                                                 waveform                      +---------+---------------+---------+-----------+---------------+--------------+ FV Mid   Full                                                             +---------+---------------+---------+-----------+---------------+--------------+ FV DistalFull                                                             +---------+---------------+---------+-----------+---------------+--------------+ PFV      Full           Yes      No         pulsatile                                                                 waveform                      +---------+---------------+---------+-----------+---------------+--------------+ POP      Full            Yes      No                                       +---------+---------------+---------+-----------+---------------+--------------+ PTV      Full                                                             +---------+---------------+---------+-----------+---------------+--------------+ PERO     Full                                                             +---------+---------------+---------+-----------+---------------+--------------+   +---------+---------------+---------+-----------+---------------+--------------+ LEFT     CompressibilityPhasicitySpontaneityProperties     Thrombus Aging +---------+---------------+---------+-----------+---------------+--------------+ CFV      Full  Yes      No         pulsatile                                                                 waveform                      +---------+---------------+---------+-----------+---------------+--------------+ SFJ      Full                                                             +---------+---------------+---------+-----------+---------------+--------------+ FV Prox  Full           Yes      No         pulsatile                                                                 waveform                      +---------+---------------+---------+-----------+---------------+--------------+ FV Mid   Full           Yes      No         pulsatile                                                                 waveform                      +---------+---------------+---------+-----------+---------------+--------------+ FV DistalFull                                                             +---------+---------------+---------+-----------+---------------+--------------+ PFV      Full                                                             +---------+---------------+---------+-----------+---------------+--------------+ POP                      Yes      No         pulsatile  waveform                      +---------+---------------+---------+-----------+---------------+--------------+ PTV      Full                                                             +---------+---------------+---------+-----------+---------------+--------------+ PERO                                                       Not well                                                                  visualized     +---------+---------------+---------+-----------+---------------+--------------+     Summary: RIGHT: - There is no evidence of deep vein thrombosis in the lower extremity. However, portions of this examination were limited- see technologist comments above.  Pulsatile waveforms noted throughout  LEFT: - There is no evidence of deep vein thrombosis in the lower extremity. However, portions of this examination were limited- see technologist comments above.  Pulsatile waveforms noted throughout.  *See table(s) above for measurements and observations. Electronically signed by Fonda Rim on 06/09/2024 at 9:16:04 AM.    Final    ECHOCARDIOGRAM COMPLETE Result Date: 06/08/2024    ECHOCARDIOGRAM REPORT   Patient Name:   Jeanette Spencer Date of Exam: 06/08/2024 Medical Rec #:  [id number]        Height:       59.0 in Accession #:    [id number]       Weight:       185.8 lb Date of Birth:  [birth year]        BSA:          1.788 m Patient Age:    73 years         BP:           120/72 mmHg Patient Gender: F                HR:           82 bpm. Exam Location:  Inpatient Procedure: 2D Echo, Cardiac Doppler and Color Doppler (Both Spectral and Color            Flow Doppler were utilized during procedure). Indications:    Pulmonary Embolus I26.09  History:        Patient has no prior history of Echocardiogram examinations.                 Risk Factors:Diabetes and Hypertension.  Sonographer:     Tinnie Gosling RDCS Referring Phys: [id number] OMAR M ALBUSTAMI IMPRESSIONS  1. Left ventricular ejection fraction, by estimation, is 60 to 65%. The left ventricle has normal function. Left ventricular endocardial border not optimally defined to evaluate regional wall motion. There is mild left ventricular hypertrophy. Left ventricular diastolic parameters are consistent with Grade I diastolic dysfunction (impaired relaxation).  2. Right ventricular systolic function is mildly reduced. The right ventricular size is normal. Tricuspid regurgitation signal is inadequate for assessing PA pressure.  3. A small pericardial effusion is present.  4. The mitral valve is degenerative. Trivial mitral valve regurgitation. No evidence of mitral stenosis.  5. The aortic valve is grossly normal. There is mild calcification of the aortic valve. Aortic valve regurgitation is not visualized. No aortic stenosis is present.  6. The inferior vena cava is normal in size with <50% respiratory variability, suggesting right atrial pressure of 8 mmHg. FINDINGS  Left Ventricle: Left ventricular ejection fraction, by estimation, is 60 to 65%. The left ventricle has normal function. Left ventricular endocardial border not optimally defined to evaluate regional wall motion. The left ventricular internal cavity size was normal in size. There is mild left ventricular hypertrophy. Left ventricular diastolic parameters are consistent with Grade I diastolic dysfunction (impaired relaxation). Right Ventricle: The right ventricular size is normal. No increase in right ventricular wall thickness. Right ventricular systolic function is mildly reduced. Tricuspid regurgitation signal is inadequate for assessing PA pressure. The tricuspid regurgitant velocity is 1.90 m/s, and with an assumed right atrial pressure of 8 mmHg, the estimated right ventricular systolic pressure is 22.4 mmHg. Left Atrium: Left atrial size was normal in size. Right Atrium: Right  atrial size was normal in size. Pericardium: A small pericardial effusion is present. Mitral Valve: The mitral valve is degenerative in appearance. There is mild calcification of the mitral valve leaflet(s). Trivial mitral valve regurgitation. No evidence of mitral valve stenosis. Tricuspid Valve: The tricuspid valve is grossly normal. Tricuspid valve regurgitation is trivial. No evidence of tricuspid stenosis. Aortic Valve: The aortic valve is grossly normal. There is mild calcification of the aortic valve. Aortic valve regurgitation is not visualized. No aortic stenosis is present. Pulmonic Valve: The pulmonic valve was normal in structure. Pulmonic valve regurgitation is not visualized. No evidence of pulmonic stenosis. Aorta: The aortic root is normal in size and structure. Venous: The inferior vena cava is normal in size with less than 50% respiratory variability, suggesting right atrial pressure of 8 mmHg. IAS/Shunts: No atrial level shunt detected by color flow Doppler.  LEFT VENTRICLE PLAX 2D LVIDd:         3.40 cm   Diastology LVIDs:         2.50 cm   LV e' medial:    6.31 cm/s LV PW:         1.30 cm   LV E/e' medial:  11.4 LV IVS:        1.30 cm   LV e' lateral:   8.16 cm/s LVOT diam:     2.10 cm   LV E/e' lateral: 8.8 LV SV:         66 LV SV Index:   37 LVOT Area:     3.46 cm  RIGHT VENTRICLE            IVC RV S prime:     9.68 cm/s  IVC diam: 2.00 cm TAPSE (M-mode): 1.7 cm LEFT ATRIUM             Index        RIGHT ATRIUM           Index LA diam:        3.70 cm 2.07 cm/m   RA Area:     14.70 cm LA Vol (A2C):   36.9 ml 20.64 ml/m  RA Volume:   36.60 ml  20.47 ml/m LA Vol (A4C):   32.7 ml 18.29 ml/m LA Biplane Vol: 35.8 ml 20.03 ml/m  AORTIC VALVE LVOT Vmax:   92.30 cm/s LVOT Vmean:  67.000 cm/s LVOT VTI:    0.191 m  AORTA Ao Root diam: 2.90 cm Ao Asc diam:  3.20 cm MITRAL VALVE               TRICUSPID VALVE MV Area (PHT): 2.84 cm    TR Peak grad:   14.4 mmHg MV Decel Time: 267 msec    TR Vmax:         190.00 cm/s MV E velocity: 71.80 cm/s MV A velocity: 99.20 cm/s  SHUNTS MV E/A ratio:  0.72        Systemic VTI:  0.19 m                            Systemic Diam: 2.10 cm Soyla Merck MD Electronically signed by Soyla Merck MD Signature Date/Time: 06/08/2024/4:56:52 PM    Final    IR THROMBECT PRIM MECH INIT (INCLU) MOD SED Result Date: 06/08/2024 INDICATION: new PE on Coumadin . continued symptoms and imaging with intermediate-high risk and slightly increased clot burden from previous imaging, elevated biomarkers, RV strain EXAM: BILATERAL PULMONARY ARTERIOGRAM LEFT PULMONARY ARTERIAL THROMBECTOMY RIGHT PULMONARY ARTERIAL THROMBECTOMY ULTRASOUND GUIDANCE FOR VASCULAR ACCESS RIGHT ILIAC VENOGRAM COMPARISON:  CTA from previous day MEDICATIONS: Lidocaine  1% subcutaneous, heparin  6,000 units ANESTHESIA/SEDATION: Intravenous Fentanyl  100mcg and Versed  2mg  were administered as conscious sedation during continuous monitoring of the patient's level of consciousness and physiological / cardiorespiratory status by the radiology RN, with a total moderate sedation time of 90 minutes. Zofran  8 mg for nausea. TECHNIQUE: Informed written consent was obtained from the patient after a thorough discussion of the procedural risks, benefits and alternatives. All questions were addressed. Maximal Sterile Barrier Technique was utilized including caps, mask, sterile gowns, sterile gloves, sterile drape, hand hygiene and skin antiseptic. A timeout was performed prior to the initiation of the procedure. Patency and complete compressibility of right common femoral vein was confirmed with ultrasound. After surgical prep, and local lidocaine  administration, micropuncture access to right common femoral vein achieved. Outflow right iliac venogram obtained, demonstrating no significant DVT. Micrdilator exchanged over a Bentson wire for a 6 French angled pigtail catheter advanced into the right pulmonary artery. Pressure measurements  obtained. Patient was heparinized with 2000 units heparin , and ACT was maintained greater than 200 with additional intermittent boluses as needed. Catheter exchanged for a 5 French vert catheter, negotiated into lower lobe branch right pulmonary artery, removed over a stiff Amplatz wire. Vascular sheath was upsized, and 24 [place] FlowTriever device advanced into right pulmonary artery. Suction thrombectomy performed. Follow-up selective right pulmonary arteriography was obtained. The catheter was then redirected to the origin of the left pulmonary artery for left pulmonary arteriography. Coaxial curved FlowTriever device advanced into distal left pulmonary artery. Suction mechanical thrombectomy performed. Follow-up selective left pulmonary arteriogram obtained. Catheter was retracted into the distal main pulmonary artery for final pulmonary arteriography and pressure measurements. Guidewire, catheter and sheath were removed and hemostasis achieved with aid of 2-0 Ethilon pursestring suture. The patient tolerated the procedure well. FLUOROSCOPY TIME:  Radiation Exposure Index (as provided by the fluoroscopic device): 169 mGy air Kerma COMPLICATIONS: None immediate. FINDINGS: Outflow right iliac venogram  show no  thrombus or occlusion. Initial right pulmonary arterial pressure69/23(40) mmHg. Selective right pulmonary arteriogram shows central partially occlusive clot extending  into the intralobar branch and lower lobe segmental branches. After right pulmonary arterial selective suction thrombectomy, there is clearance of the central clot with some residual partially occlusive clot in peripheral segmental branches. Selective left pulmonary arteriogram demonstrates partially occlusive thrombus in the proximal left pulmonary artery . After left pulmonary artery selective suction thrombectomy, clearance of thrombus from the central left pulmonary artery with improved branch perfusion. Final pulmonary arterial pressure  53/18 (31)mmHg. IMPRESSION: 1. Negative for right iliac venous in a thrombus. 2. Bilateral central pulmonary emboli with markedly elevated pulmonary arterial pressures. 3. Good response to bilateral selective pulmonary arterial suction thrombectomy, with improved pulmonary arterial pressures. Electronically Signed   By: JONETTA Faes M.D.   On: 06/08/2024 14:46   IR US  Guide Vasc Access Right Result Date: 06/08/2024 INDICATION: new PE on Coumadin . continued symptoms and imaging with intermediate-high risk and slightly increased clot burden from previous imaging, elevated biomarkers, RV strain EXAM: BILATERAL PULMONARY ARTERIOGRAM LEFT PULMONARY ARTERIAL THROMBECTOMY RIGHT PULMONARY ARTERIAL THROMBECTOMY ULTRASOUND GUIDANCE FOR VASCULAR ACCESS RIGHT ILIAC VENOGRAM COMPARISON:  CTA from previous day MEDICATIONS: Lidocaine  1% subcutaneous, heparin  6,000 units ANESTHESIA/SEDATION: Intravenous Fentanyl  100mcg and Versed  2mg  were administered as conscious sedation during continuous monitoring of the patient's level of consciousness and physiological / cardiorespiratory status by the radiology RN, with a total moderate sedation time of 90 minutes. Zofran  8 mg for nausea. TECHNIQUE: Informed written consent was obtained from the patient after a thorough discussion of the procedural risks, benefits and alternatives. All questions were addressed. Maximal Sterile Barrier Technique was utilized including caps, mask, sterile gowns, sterile gloves, sterile drape, hand hygiene and skin antiseptic. A timeout was performed prior to the initiation of the procedure. Patency and complete compressibility of right common femoral vein was confirmed with ultrasound. After surgical prep, and local lidocaine  administration, micropuncture access to right common femoral vein achieved. Outflow right iliac venogram obtained, demonstrating no significant DVT. Micrdilator exchanged over a Bentson wire for a 6 French angled pigtail catheter  advanced into the right pulmonary artery. Pressure measurements obtained. Patient was heparinized with 2000 units heparin , and ACT was maintained greater than 200 with additional intermittent boluses as needed. Catheter exchanged for a 5 French vert catheter, negotiated into lower lobe branch right pulmonary artery, removed over a stiff Amplatz wire. Vascular sheath was upsized, and 24 [place] FlowTriever device advanced into right pulmonary artery. Suction thrombectomy performed. Follow-up selective right pulmonary arteriography was obtained. The catheter was then redirected to the origin of the left pulmonary artery for left pulmonary arteriography. Coaxial curved FlowTriever device advanced into distal left pulmonary artery. Suction mechanical thrombectomy performed. Follow-up selective left pulmonary arteriogram obtained. Catheter was retracted into the distal main pulmonary artery for final pulmonary arteriography and pressure measurements. Guidewire, catheter and sheath were removed and hemostasis achieved with aid of 2-0 Ethilon pursestring suture. The patient tolerated the procedure well. FLUOROSCOPY TIME:  Radiation Exposure Index (as provided by the fluoroscopic device): 169 mGy air Kerma COMPLICATIONS: None immediate. FINDINGS: Outflow right iliac venogram  show no  thrombus or occlusion. Initial right pulmonary arterial pressure69/23(40) mmHg. Selective right pulmonary arteriogram shows central partially occlusive clot extending into the intralobar branch and lower lobe segmental branches. After right pulmonary arterial selective suction thrombectomy, there is clearance of the central clot with some residual partially occlusive clot in peripheral segmental branches. Selective left pulmonary arteriogram demonstrates partially occlusive thrombus in the proximal left pulmonary artery . After left pulmonary artery selective suction thrombectomy, clearance of thrombus from the  central left pulmonary artery  with improved branch perfusion. Final pulmonary arterial pressure 53/18 (31)mmHg. IMPRESSION: 1. Negative for right iliac venous in a thrombus. 2. Bilateral central pulmonary emboli with markedly elevated pulmonary arterial pressures. 3. Good response to bilateral selective pulmonary arterial suction thrombectomy, with improved pulmonary arterial pressures. Electronically Signed   By: JONETTA Faes M.D.   On: 06/08/2024 14:46   IR THROMBECT PRIM MECH ADD (INCLU) MOD SED Result Date: 06/08/2024 INDICATION: new PE on Coumadin . continued symptoms and imaging with intermediate-high risk and slightly increased clot burden from previous imaging, elevated biomarkers, RV strain EXAM: BILATERAL PULMONARY ARTERIOGRAM LEFT PULMONARY ARTERIAL THROMBECTOMY RIGHT PULMONARY ARTERIAL THROMBECTOMY ULTRASOUND GUIDANCE FOR VASCULAR ACCESS RIGHT ILIAC VENOGRAM COMPARISON:  CTA from previous day MEDICATIONS: Lidocaine  1% subcutaneous, heparin  6,000 units ANESTHESIA/SEDATION: Intravenous Fentanyl  100mcg and Versed  2mg  were administered as conscious sedation during continuous monitoring of the patient's level of consciousness and physiological / cardiorespiratory status by the radiology RN, with a total moderate sedation time of 90 minutes. Zofran  8 mg for nausea. TECHNIQUE: Informed written consent was obtained from the patient after a thorough discussion of the procedural risks, benefits and alternatives. All questions were addressed. Maximal Sterile Barrier Technique was utilized including caps, mask, sterile gowns, sterile gloves, sterile drape, hand hygiene and skin antiseptic. A timeout was performed prior to the initiation of the procedure. Patency and complete compressibility of right common femoral vein was confirmed with ultrasound. After surgical prep, and local lidocaine  administration, micropuncture access to right common femoral vein achieved. Outflow right iliac venogram obtained, demonstrating no significant DVT.  Micrdilator exchanged over a Bentson wire for a 6 French angled pigtail catheter advanced into the right pulmonary artery. Pressure measurements obtained. Patient was heparinized with 2000 units heparin , and ACT was maintained greater than 200 with additional intermittent boluses as needed. Catheter exchanged for a 5 French vert catheter, negotiated into lower lobe branch right pulmonary artery, removed over a stiff Amplatz wire. Vascular sheath was upsized, and 24 [place] FlowTriever device advanced into right pulmonary artery. Suction thrombectomy performed. Follow-up selective right pulmonary arteriography was obtained. The catheter was then redirected to the origin of the left pulmonary artery for left pulmonary arteriography. Coaxial curved FlowTriever device advanced into distal left pulmonary artery. Suction mechanical thrombectomy performed. Follow-up selective left pulmonary arteriogram obtained. Catheter was retracted into the distal main pulmonary artery for final pulmonary arteriography and pressure measurements. Guidewire, catheter and sheath were removed and hemostasis achieved with aid of 2-0 Ethilon pursestring suture. The patient tolerated the procedure well. FLUOROSCOPY TIME:  Radiation Exposure Index (as provided by the fluoroscopic device): 169 mGy air Kerma COMPLICATIONS: None immediate. FINDINGS: Outflow right iliac venogram  show no  thrombus or occlusion. Initial right pulmonary arterial pressure69/23(40) mmHg. Selective right pulmonary arteriogram shows central partially occlusive clot extending into the intralobar branch and lower lobe segmental branches. After right pulmonary arterial selective suction thrombectomy, there is clearance of the central clot with some residual partially occlusive clot in peripheral segmental branches. Selective left pulmonary arteriogram demonstrates partially occlusive thrombus in the proximal left pulmonary artery . After left pulmonary artery selective  suction thrombectomy, clearance of thrombus from the central left pulmonary artery with improved branch perfusion. Final pulmonary arterial pressure 53/18 (31)mmHg. IMPRESSION: 1. Negative for right iliac venous in a thrombus. 2. Bilateral central pulmonary emboli with markedly elevated pulmonary arterial pressures. 3. Good response to bilateral selective pulmonary arterial suction thrombectomy, with improved pulmonary arterial pressures. Electronically Signed   By: JONETTA  Johann M.D.   On: 06/08/2024 14:46   IR THROMBECT PRIM MECH ADD (INCLU) MOD SED Result Date: 06/08/2024 INDICATION: new PE on Coumadin . continued symptoms and imaging with intermediate-high risk and slightly increased clot burden from previous imaging, elevated biomarkers, RV strain EXAM: BILATERAL PULMONARY ARTERIOGRAM LEFT PULMONARY ARTERIAL THROMBECTOMY RIGHT PULMONARY ARTERIAL THROMBECTOMY ULTRASOUND GUIDANCE FOR VASCULAR ACCESS RIGHT ILIAC VENOGRAM COMPARISON:  CTA from previous day MEDICATIONS: Lidocaine  1% subcutaneous, heparin  6,000 units ANESTHESIA/SEDATION: Intravenous Fentanyl  100mcg and Versed  2mg  were administered as conscious sedation during continuous monitoring of the patient's level of consciousness and physiological / cardiorespiratory status by the radiology RN, with a total moderate sedation time of 90 minutes. Zofran  8 mg for nausea. TECHNIQUE: Informed written consent was obtained from the patient after a thorough discussion of the procedural risks, benefits and alternatives. All questions were addressed. Maximal Sterile Barrier Technique was utilized including caps, mask, sterile gowns, sterile gloves, sterile drape, hand hygiene and skin antiseptic. A timeout was performed prior to the initiation of the procedure. Patency and complete compressibility of right common femoral vein was confirmed with ultrasound. After surgical prep, and local lidocaine  administration, micropuncture access to right common femoral vein achieved.  Outflow right iliac venogram obtained, demonstrating no significant DVT. Micrdilator exchanged over a Bentson wire for a 6 French angled pigtail catheter advanced into the right pulmonary artery. Pressure measurements obtained. Patient was heparinized with 2000 units heparin , and ACT was maintained greater than 200 with additional intermittent boluses as needed. Catheter exchanged for a 5 French vert catheter, negotiated into lower lobe branch right pulmonary artery, removed over a stiff Amplatz wire. Vascular sheath was upsized, and 24 [place] FlowTriever device advanced into right pulmonary artery. Suction thrombectomy performed. Follow-up selective right pulmonary arteriography was obtained. The catheter was then redirected to the origin of the left pulmonary artery for left pulmonary arteriography. Coaxial curved FlowTriever device advanced into distal left pulmonary artery. Suction mechanical thrombectomy performed. Follow-up selective left pulmonary arteriogram obtained. Catheter was retracted into the distal main pulmonary artery for final pulmonary arteriography and pressure measurements. Guidewire, catheter and sheath were removed and hemostasis achieved with aid of 2-0 Ethilon pursestring suture. The patient tolerated the procedure well. FLUOROSCOPY TIME:  Radiation Exposure Index (as provided by the fluoroscopic device): 169 mGy air Kerma COMPLICATIONS: None immediate. FINDINGS: Outflow right iliac venogram  show no  thrombus or occlusion. Initial right pulmonary arterial pressure69/23(40) mmHg. Selective right pulmonary arteriogram shows central partially occlusive clot extending into the intralobar branch and lower lobe segmental branches. After right pulmonary arterial selective suction thrombectomy, there is clearance of the central clot with some residual partially occlusive clot in peripheral segmental branches. Selective left pulmonary arteriogram demonstrates partially occlusive thrombus in the  proximal left pulmonary artery . After left pulmonary artery selective suction thrombectomy, clearance of thrombus from the central left pulmonary artery with improved branch perfusion. Final pulmonary arterial pressure 53/18 (31)mmHg. IMPRESSION: 1. Negative for right iliac venous in a thrombus. 2. Bilateral central pulmonary emboli with markedly elevated pulmonary arterial pressures. 3. Good response to bilateral selective pulmonary arterial suction thrombectomy, with improved pulmonary arterial pressures. Electronically Signed   By: JONETTA Johann M.D.   On: 06/08/2024 14:46   IR Angiogram Pulmonary Bilateral Selective Result Date: 06/08/2024 INDICATION: new PE on Coumadin . continued symptoms and imaging with intermediate-high risk and slightly increased clot burden from previous imaging, elevated biomarkers, RV strain EXAM: BILATERAL PULMONARY ARTERIOGRAM LEFT PULMONARY ARTERIAL THROMBECTOMY RIGHT PULMONARY ARTERIAL THROMBECTOMY ULTRASOUND GUIDANCE FOR VASCULAR ACCESS  RIGHT ILIAC VENOGRAM COMPARISON:  CTA from previous day MEDICATIONS: Lidocaine  1% subcutaneous, heparin  6,000 units ANESTHESIA/SEDATION: Intravenous Fentanyl  100mcg and Versed  2mg  were administered as conscious sedation during continuous monitoring of the patient's level of consciousness and physiological / cardiorespiratory status by the radiology RN, with a total moderate sedation time of 90 minutes. Zofran  8 mg for nausea. TECHNIQUE: Informed written consent was obtained from the patient after a thorough discussion of the procedural risks, benefits and alternatives. All questions were addressed. Maximal Sterile Barrier Technique was utilized including caps, mask, sterile gowns, sterile gloves, sterile drape, hand hygiene and skin antiseptic. A timeout was performed prior to the initiation of the procedure. Patency and complete compressibility of right common femoral vein was confirmed with ultrasound. After surgical prep, and local lidocaine   administration, micropuncture access to right common femoral vein achieved. Outflow right iliac venogram obtained, demonstrating no significant DVT. Micrdilator exchanged over a Bentson wire for a 6 French angled pigtail catheter advanced into the right pulmonary artery. Pressure measurements obtained. Patient was heparinized with 2000 units heparin , and ACT was maintained greater than 200 with additional intermittent boluses as needed. Catheter exchanged for a 5 French vert catheter, negotiated into lower lobe branch right pulmonary artery, removed over a stiff Amplatz wire. Vascular sheath was upsized, and 24 [place] FlowTriever device advanced into right pulmonary artery. Suction thrombectomy performed. Follow-up selective right pulmonary arteriography was obtained. The catheter was then redirected to the origin of the left pulmonary artery for left pulmonary arteriography. Coaxial curved FlowTriever device advanced into distal left pulmonary artery. Suction mechanical thrombectomy performed. Follow-up selective left pulmonary arteriogram obtained. Catheter was retracted into the distal main pulmonary artery for final pulmonary arteriography and pressure measurements. Guidewire, catheter and sheath were removed and hemostasis achieved with aid of 2-0 Ethilon pursestring suture. The patient tolerated the procedure well. FLUOROSCOPY TIME:  Radiation Exposure Index (as provided by the fluoroscopic device): 169 mGy air Kerma COMPLICATIONS: None immediate. FINDINGS: Outflow right iliac venogram  show no  thrombus or occlusion. Initial right pulmonary arterial pressure69/23(40) mmHg. Selective right pulmonary arteriogram shows central partially occlusive clot extending into the intralobar branch and lower lobe segmental branches. After right pulmonary arterial selective suction thrombectomy, there is clearance of the central clot with some residual partially occlusive clot in peripheral segmental branches. Selective  left pulmonary arteriogram demonstrates partially occlusive thrombus in the proximal left pulmonary artery . After left pulmonary artery selective suction thrombectomy, clearance of thrombus from the central left pulmonary artery with improved branch perfusion. Final pulmonary arterial pressure 53/18 (31)mmHg. IMPRESSION: 1. Negative for right iliac venous in a thrombus. 2. Bilateral central pulmonary emboli with markedly elevated pulmonary arterial pressures. 3. Good response to bilateral selective pulmonary arterial suction thrombectomy, with improved pulmonary arterial pressures. Electronically Signed   By: JONETTA Faes M.D.   On: 06/08/2024 14:46   IR Angiogram Selective Each Additional Vessel Result Date: 06/08/2024 INDICATION: new PE on Coumadin . continued symptoms and imaging with intermediate-high risk and slightly increased clot burden from previous imaging, elevated biomarkers, RV strain EXAM: BILATERAL PULMONARY ARTERIOGRAM LEFT PULMONARY ARTERIAL THROMBECTOMY RIGHT PULMONARY ARTERIAL THROMBECTOMY ULTRASOUND GUIDANCE FOR VASCULAR ACCESS RIGHT ILIAC VENOGRAM COMPARISON:  CTA from previous day MEDICATIONS: Lidocaine  1% subcutaneous, heparin  6,000 units ANESTHESIA/SEDATION: Intravenous Fentanyl  100mcg and Versed  2mg  were administered as conscious sedation during continuous monitoring of the patient's level of consciousness and physiological / cardiorespiratory status by the radiology RN, with a total moderate sedation time of 90 minutes. Zofran  8 mg for nausea.  TECHNIQUE: Informed written consent was obtained from the patient after a thorough discussion of the procedural risks, benefits and alternatives. All questions were addressed. Maximal Sterile Barrier Technique was utilized including caps, mask, sterile gowns, sterile gloves, sterile drape, hand hygiene and skin antiseptic. A timeout was performed prior to the initiation of the procedure. Patency and complete compressibility of right common femoral  vein was confirmed with ultrasound. After surgical prep, and local lidocaine  administration, micropuncture access to right common femoral vein achieved. Outflow right iliac venogram obtained, demonstrating no significant DVT. Micrdilator exchanged over a Bentson wire for a 6 French angled pigtail catheter advanced into the right pulmonary artery. Pressure measurements obtained. Patient was heparinized with 2000 units heparin , and ACT was maintained greater than 200 with additional intermittent boluses as needed. Catheter exchanged for a 5 French vert catheter, negotiated into lower lobe branch right pulmonary artery, removed over a stiff Amplatz wire. Vascular sheath was upsized, and 24 [place] FlowTriever device advanced into right pulmonary artery. Suction thrombectomy performed. Follow-up selective right pulmonary arteriography was obtained. The catheter was then redirected to the origin of the left pulmonary artery for left pulmonary arteriography. Coaxial curved FlowTriever device advanced into distal left pulmonary artery. Suction mechanical thrombectomy performed. Follow-up selective left pulmonary arteriogram obtained. Catheter was retracted into the distal main pulmonary artery for final pulmonary arteriography and pressure measurements. Guidewire, catheter and sheath were removed and hemostasis achieved with aid of 2-0 Ethilon pursestring suture. The patient tolerated the procedure well. FLUOROSCOPY TIME:  Radiation Exposure Index (as provided by the fluoroscopic device): 169 mGy air Kerma COMPLICATIONS: None immediate. FINDINGS: Outflow right iliac venogram  show no  thrombus or occlusion. Initial right pulmonary arterial pressure69/23(40) mmHg. Selective right pulmonary arteriogram shows central partially occlusive clot extending into the intralobar branch and lower lobe segmental branches. After right pulmonary arterial selective suction thrombectomy, there is clearance of the central clot with some  residual partially occlusive clot in peripheral segmental branches. Selective left pulmonary arteriogram demonstrates partially occlusive thrombus in the proximal left pulmonary artery . After left pulmonary artery selective suction thrombectomy, clearance of thrombus from the central left pulmonary artery with improved branch perfusion. Final pulmonary arterial pressure 53/18 (31)mmHg. IMPRESSION: 1. Negative for right iliac venous in a thrombus. 2. Bilateral central pulmonary emboli with markedly elevated pulmonary arterial pressures. 3. Good response to bilateral selective pulmonary arterial suction thrombectomy, with improved pulmonary arterial pressures. Electronically Signed   By: JONETTA Faes M.D.   On: 06/08/2024 14:46   IR Angiogram Selective Each Additional Vessel Result Date: 06/08/2024 INDICATION: new PE on Coumadin . continued symptoms and imaging with intermediate-high risk and slightly increased clot burden from previous imaging, elevated biomarkers, RV strain EXAM: BILATERAL PULMONARY ARTERIOGRAM LEFT PULMONARY ARTERIAL THROMBECTOMY RIGHT PULMONARY ARTERIAL THROMBECTOMY ULTRASOUND GUIDANCE FOR VASCULAR ACCESS RIGHT ILIAC VENOGRAM COMPARISON:  CTA from previous day MEDICATIONS: Lidocaine  1% subcutaneous, heparin  6,000 units ANESTHESIA/SEDATION: Intravenous Fentanyl  100mcg and Versed  2mg  were administered as conscious sedation during continuous monitoring of the patient's level of consciousness and physiological / cardiorespiratory status by the radiology RN, with a total moderate sedation time of 90 minutes. Zofran  8 mg for nausea. TECHNIQUE: Informed written consent was obtained from the patient after a thorough discussion of the procedural risks, benefits and alternatives. All questions were addressed. Maximal Sterile Barrier Technique was utilized including caps, mask, sterile gowns, sterile gloves, sterile drape, hand hygiene and skin antiseptic. A timeout was performed prior to the initiation of  the procedure. Patency and complete compressibility  of right common femoral vein was confirmed with ultrasound. After surgical prep, and local lidocaine  administration, micropuncture access to right common femoral vein achieved. Outflow right iliac venogram obtained, demonstrating no significant DVT. Micrdilator exchanged over a Bentson wire for a 6 French angled pigtail catheter advanced into the right pulmonary artery. Pressure measurements obtained. Patient was heparinized with 2000 units heparin , and ACT was maintained greater than 200 with additional intermittent boluses as needed. Catheter exchanged for a 5 French vert catheter, negotiated into lower lobe branch right pulmonary artery, removed over a stiff Amplatz wire. Vascular sheath was upsized, and 24 [place] FlowTriever device advanced into right pulmonary artery. Suction thrombectomy performed. Follow-up selective right pulmonary arteriography was obtained. The catheter was then redirected to the origin of the left pulmonary artery for left pulmonary arteriography. Coaxial curved FlowTriever device advanced into distal left pulmonary artery. Suction mechanical thrombectomy performed. Follow-up selective left pulmonary arteriogram obtained. Catheter was retracted into the distal main pulmonary artery for final pulmonary arteriography and pressure measurements. Guidewire, catheter and sheath were removed and hemostasis achieved with aid of 2-0 Ethilon pursestring suture. The patient tolerated the procedure well. FLUOROSCOPY TIME:  Radiation Exposure Index (as provided by the fluoroscopic device): 169 mGy air Kerma COMPLICATIONS: None immediate. FINDINGS: Outflow right iliac venogram  show no  thrombus or occlusion. Initial right pulmonary arterial pressure69/23(40) mmHg. Selective right pulmonary arteriogram shows central partially occlusive clot extending into the intralobar branch and lower lobe segmental branches. After right pulmonary arterial selective  suction thrombectomy, there is clearance of the central clot with some residual partially occlusive clot in peripheral segmental branches. Selective left pulmonary arteriogram demonstrates partially occlusive thrombus in the proximal left pulmonary artery . After left pulmonary artery selective suction thrombectomy, clearance of thrombus from the central left pulmonary artery with improved branch perfusion. Final pulmonary arterial pressure 53/18 (31)mmHg. IMPRESSION: 1. Negative for right iliac venous in a thrombus. 2. Bilateral central pulmonary emboli with markedly elevated pulmonary arterial pressures. 3. Good response to bilateral selective pulmonary arterial suction thrombectomy, with improved pulmonary arterial pressures. Electronically Signed   By: JONETTA Faes M.D.   On: 06/08/2024 14:46   CT Angio Chest PE W and/or Wo Contrast Addendum Date: 06/07/2024 ADDENDUM REPORT: 06/07/2024 23:36 ADDENDUM: Right-sided thrombus in the distal right main pulmonary artery and right upper lobe has slightly increased compared to 05/30/2024. These results were called by telephone at the time of interpretation on 06/07/2024 at 11:36 pm to provider KEVIN PADUCHOWSKI , who verbally acknowledged these results. Electronically Signed   By: Greig Pique M.D.   On: 06/07/2024 23:36   Result Date: 06/07/2024 CLINICAL DATA:  Shortness of breath.  Near syncope. EXAM: CT ANGIOGRAPHY CHEST WITH CONTRAST TECHNIQUE: Multidetector CT imaging of the chest was performed using the standard protocol during bolus administration of intravenous contrast. Multiplanar CT image reconstructions and MIPs were obtained to evaluate the vascular anatomy. RADIATION DOSE REDUCTION: This exam was performed according to the departmental dose-optimization program which includes automated exposure control, adjustment of the mA and/or kV according to patient size and/or use of iterative reconstruction technique. CONTRAST:  75mL OMNIPAQUE  IOHEXOL  350 MG/ML  SOLN COMPARISON:  Chest CT 05/30/2024 FINDINGS: Cardiovascular: There is adequate opacification of the pulmonary arteries to the segmental level. Again seen are extensive bilateral pulmonary emboli within the distal main pulmonary arteries bilaterally extending into all lobes to the segmental level. Overall distribution and burden of thrombus has not significantly changed compared to 05/30/2024. The heart is normal in size.  There is a new small pericardial effusion. Aorta is normal in size. Mediastinum/Nodes: No enlarged mediastinal, hilar, or axillary lymph nodes. Thyroid  gland, trachea, and esophagus demonstrate no significant findings. Lungs/Pleura: There are multiple new areas of ground-glass opacities predominantly in the bilateral upper lobes and minimally in the lower lobes and right middle lobe. These may represent pulmonary infarcts. Infectious etiology not excluded. No pleural effusion or pneumothorax. Upper Abdomen: Left renal atrophy again noted. Musculoskeletal: No chest wall abnormality. No acute or significant osseous findings. Review of the MIP images confirms the above findings. IMPRESSION: 1. Stable distribution and burden of extensive bilateral pulmonary emboli. Positive for acute PE with CT evidence of right heart strain (RV/LV Ratio = 1.3) consistent with at least submassive (intermediate risk) PE. The presence of right heart strain has been associated with an increased risk of morbidity and mortality. 2. New small pericardial effusion. 3. New multifocal ground-glass opacities in the lungs may represent pulmonary infarcts. Infectious etiology not excluded. Electronically Signed: By: Greig Pique M.D. On: 06/07/2024 23:30   DG Chest 2 View Result Date: 06/07/2024 CLINICAL DATA:  Shortness of breath, history of pulmonary embolism EXAM: CHEST - 2 VIEW COMPARISON:  05/30/2024 FINDINGS: Cardiac shadow is within normal limits. Lungs are well aerated bilaterally. No focal infiltrate or effusion is  seen. No bony abnormality is noted. IMPRESSION: No active cardiopulmonary disease. Electronically Signed   By: Oneil Devonshire M.D.   On: 06/07/2024 20:34   Status post thrombectomy by IR.  Subjective: Patient seen and examined at bedside. Overnight events noted. Patient reports feeling much better, denies any concerns.   INR is 1.80 states  She has follow-up in Coumadin  clinic on Monday.  Discharge Exam: Vitals:   06/15/24 0811 06/15/24 1237  BP: (!) 147/72 130/67  Pulse: 76 77  Resp: 17 17  Temp: 98.3 F (36.8 C) 98.2 F (36.8 C)  SpO2: 99% 98%   Vitals:   06/14/24 2343 06/15/24 0428 06/15/24 0811 06/15/24 1237  BP: (!) 145/68 122/68 (!) 147/72 130/67  Pulse: 80 72 76 77  Resp: 15 15 17 17   Temp: 97.8 F (36.6 C) 98 F (36.7 C) 98.3 F (36.8 C) 98.2 F (36.8 C)  TempSrc:      SpO2: 99% 98% 99% 98%  Weight:  81.1 kg    Height:        General: Pt is alert, awake, not in acute distress Cardiovascular: RRR, S1/S2 +, no rubs, no gallops Respiratory: CTA bilaterally, no wheezing, no rhonchi Abdominal: Soft, NT, ND, bowel sounds + Extremities: no edema, no cyanosis    The results of significant diagnostics from this hospitalization (including imaging, microbiology, ancillary and laboratory) are listed below for reference.     Microbiology: Recent Results (from the past 240 hours)  MRSA Next Gen by PCR, Nasal     Status: None   Collection Time: 06/08/24  2:29 AM   Specimen: Nasal Mucosa; Nasal Swab  Result Value Ref Range Status   MRSA by PCR Next Gen NOT DETECTED NOT DETECTED Final    Comment: (NOTE) The GeneXpert MRSA Assay (FDA approved for NASAL specimens only), is one component of a comprehensive MRSA colonization surveillance program. It is not intended to diagnose MRSA infection nor to guide or monitor treatment for MRSA infections. Test performance is not FDA approved in patients less than 83 years old. Performed at [facility], [address]  [place], [place] [address]      Labs: BNP (last 3  results) Recent Labs    06/07/24 1959  BNP 454.5*   Basic Metabolic Panel: Recent Labs  Lab 06/15/24 1120  NA 136  K 4.6  CL 104  CO2 22  GLUCOSE 250*  BUN 26*  CREATININE 1.24*  CALCIUM  9.3  MG 2.1  PHOS 3.1   Liver Function Tests: No results for input(s): AST, ALT, ALKPHOS, BILITOT, PROT, ALBUMIN in the last 168 hours. No results for input(s): LIPASE, AMYLASE in the last 168 hours. No results for input(s): AMMONIA in the last 168 hours. CBC: Recent Labs  Lab 06/10/24 0842 06/11/24 0547 06/12/24 0448 06/13/24 0145 06/15/24 1120  WBC 9.3 8.3 8.3 9.2 8.9  HGB 9.5* 9.2* 9.7* 10.4* 10.9*  HCT 30.4* 30.0* 31.4* 33.2* 35.4*  MCV 85.4 84.7 84.0 84.1 85.3  PLT 226 232 256 284 311   Cardiac Enzymes: No results for input(s): CKTOTAL, CKMB, CKMBINDEX, TROPONINI in the last 168 hours. BNP: Invalid input(s): POCBNP CBG: Recent Labs  Lab 06/14/24 1144 06/14/24 1624 06/14/24 2040 06/15/24 0813 06/15/24 1239  GLUCAP 197* 142* 162* 152* 209*   D-Dimer No results for input(s): DDIMER in the last 72 hours. Hgb A1c No results for input(s): HGBA1C in the last 72 hours. Lipid Profile No results for input(s): CHOL, HDL, LDLCALC, TRIG, CHOLHDL, LDLDIRECT in the last 72 hours. Thyroid  function studies No results for input(s): TSH, T4TOTAL, T3FREE, THYROIDAB in the last 72 hours.  Invalid input(s): FREET3 Anemia work up No results for input(s): VITAMINB12, FOLATE, FERRITIN, TIBC, IRON, RETICCTPCT in the last 72 hours. Urinalysis    Component Value Date/Time   COLORURINE YELLOW 02/03/2021 0935   APPEARANCEUR Sl Cloudy (A) 02/03/2021 0935   LABSPEC 1.020 02/03/2021 0935   PHURINE 5.5 02/03/2021 0935   GLUCOSEU NEGATIVE 02/03/2021 0935   HGBUR SMALL (A) 02/03/2021 0935   BILIRUBINUR NEGATIVE 02/03/2021 0935   BILIRUBINUR negative 11/09/2018 1028    KETONESUR NEGATIVE 02/03/2021 0935   PROTEINUR Negative 11/09/2018 1028   UROBILINOGEN 0.2 02/03/2021 0935   NITRITE NEGATIVE 02/03/2021 0935   LEUKOCYTESUR MODERATE (A) 02/03/2021 0935   Sepsis Labs Recent Labs  Lab 06/11/24 0547 06/12/24 0448 06/13/24 0145 06/15/24 1120  WBC 8.3 8.3 9.2 8.9   Microbiology Recent Results (from the past 240 hours)  MRSA Next Gen by PCR, Nasal     Status: None   Collection Time: 06/08/24  2:29 AM   Specimen: Nasal Mucosa; Nasal Swab  Result Value Ref Range Status   MRSA by PCR Next Gen NOT DETECTED NOT DETECTED Final    Comment: (NOTE) The GeneXpert MRSA Assay (FDA approved for NASAL specimens only), is one component of a comprehensive MRSA colonization surveillance program. It is not intended to diagnose MRSA infection nor to guide or monitor treatment for MRSA infections. Test performance is not FDA approved in patients less than 67 years old. Performed at [facility], [address] [place], [place] [address]      Time coordinating discharge: Over 30 minutes  SIGNED:   Darcel Dawley, MD  [facility] 06/15/2024, 1:32 PM Pager   If 7PM-7AM, please contact night-coverage

## 2024-06-15 NOTE — Progress Notes (Signed)
 PHARMACY - ANTICOAGULATION CONSULT NOTE  Pharmacy Consult for warfarin / Lovenox    Indication: pulmonary embolus  Allergies  Allergen Reactions   Oxycodone      Stops her breathing   Glipizide      Recurrent hypoglycemia   Lipitor [Atorvastatin ]     Affected balance   Penicillin G     Other reaction(s): Unknown    Patient Measurements: Height: 4' 11 (149.9 cm) Weight: 81.1 kg (178 lb 12.7 oz) IBW/kg (Calculated) : 43.2 HEPARIN  DW (KG): 63.1  Vital Signs: Temp: 98.3 F (36.8 C) (09/20 0811) BP: 147/72 (09/20 0811) Pulse Rate: 76 (09/20 0811)  Labs: Recent Labs    06/13/24 0145 06/14/24 0441  HGB 10.4*  --   HCT 33.2*  --   PLT 284  --   LABPROT 22.7* 22.8*  INR 1.9* 1.9*    Estimated Creatinine Clearance: 38.2 mL/min (A) (by C-G formula based on SCr of 1.19 mg/dL (H)).   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 Pharmacy consulted to dose warfarin; on warfarin PTA with subtherapeutic INR.   9/12-9/14 on heparin  9/14 afternoon >> transition to therapeutic enoxaparin  / warfarin. 9/17 INR 1.7, will increase warfarin dose to get to goal INR  Most recent INR 1.9 from 9/19. Patient refusing labs and warfarin/lovenox  dose(s) due to concern after IV site was oozing blood 9/19. MD aware and recommended continuing at least warfarin. Patient refused. Will continue to monitor.  Goal of Therapy:  INR 2-3 Anti-Xa level 0.6-1 units/ml 4hrs after LMWH dose given Monitor platelets by anticoagulation protocol: Yes   Plan:  Continue enoxaparin  80 mg SQ q12h  Warfarin 5 mg PO x 1 at 1600 Overlap of enoxaparin  / warfarin  until 2x therapeutic INR Daily INR  Continue to monitor H&H and platelets  Thank you for involving pharmacy in this patient's care.  Elma Fail, PharmD PGY1 Clinical Pharmacist [facility]  06/15/2024 11:30 AM

## 2024-06-15 NOTE — Plan of Care (Signed)
   Problem: Education: Goal: Knowledge of General Education information will improve Description Including pain rating scale, medication(s)/side effects and non-pharmacologic comfort measures Outcome: Progressing

## 2024-06-15 NOTE — Discharge Instructions (Signed)
 Advised to follow-up with primary care physician in 1 week. Advised to take Coumadin  as prescribed. Advised to follow-up with Coumadin  clinic.

## 2024-06-17 ENCOUNTER — Other Ambulatory Visit: Payer: Medicare (Managed Care)

## 2024-06-17 ENCOUNTER — Encounter: Payer: Medicare (Managed Care) | Admitting: Oncology

## 2024-06-25 ENCOUNTER — Other Ambulatory Visit: Payer: Self-pay

## 2024-06-25 ENCOUNTER — Inpatient Hospital Stay: Payer: Medicare (Managed Care)

## 2024-06-25 ENCOUNTER — Inpatient Hospital Stay: Payer: Medicare (Managed Care) | Attending: Oncology | Admitting: Oncology

## 2024-06-25 VITALS — BP 120/65 | HR 76 | Temp 98.3°F | Resp 18 | Ht 59.0 in | Wt 179.9 lb

## 2024-06-25 DIAGNOSIS — Z86711 Personal history of pulmonary embolism: Secondary | ICD-10-CM | POA: Insufficient documentation

## 2024-06-25 DIAGNOSIS — Z7901 Long term (current) use of anticoagulants: Secondary | ICD-10-CM | POA: Insufficient documentation

## 2024-06-25 DIAGNOSIS — I2609 Other pulmonary embolism with acute cor pulmonale: Secondary | ICD-10-CM | POA: Diagnosis not present

## 2024-06-25 DIAGNOSIS — R5383 Other fatigue: Secondary | ICD-10-CM | POA: Diagnosis not present

## 2024-06-25 DIAGNOSIS — Z833 Family history of diabetes mellitus: Secondary | ICD-10-CM

## 2024-06-25 DIAGNOSIS — Z809 Family history of malignant neoplasm, unspecified: Secondary | ICD-10-CM

## 2024-06-25 DIAGNOSIS — Z79899 Other long term (current) drug therapy: Secondary | ICD-10-CM

## 2024-06-25 LAB — PROTIME-INR
INR: 3.3 — ABNORMAL HIGH (ref 0.8–1.2)
Prothrombin Time: 35.2 s — ABNORMAL HIGH (ref 11.4–15.2)

## 2024-06-25 LAB — ANTITHROMBIN III: AntiThromb III Func: 127 % — ABNORMAL HIGH (ref 75–120)

## 2024-06-25 NOTE — Progress Notes (Unsigned)
 Hematology/Oncology Consult note [facility] Telephone:([phone] [phone]  Patient Care Team: Myra Geni ORN, FNP as PCP - General (Family Medicine) Lateef, Munsoor, MD (Nephrology)   Name of the patient: Jeanette Spencer  [id number]  Apr 18, 1951    Reason for referral-new diagnosis of pulmonary embolism   Referring physician- Dr. Isadora  Date of visit: 06/25/24   History of presenting illness- Patient is a 73 year old female with a past medical history significant for stage III CKD hypertension, type 2 diabetes and osteoarthritis.  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  She was on Coumadin  for 6 years and was taken off eventually.  She was admitted to [facility]  on 05/31/2024 with new diagnosis of pulmonary embolism and was discharged on Coumadin .  She then Jeanette Spencer presented to [facility] on 06/08/2024 with a syncopal event.  CT angio chest showed stable distribution and burden of extensive bilateral pulmonary emboli with positive for heart strain consistent with submassive PE.  New small pericardial effusion.  New multifocal ground glass opacities in the lungs which may represent pulmonary infarcts.  Patient underwent thrombectomy by vascular surgery.  She was again discharged on Coumadin .  Patient has not had any hypercoagulable workup done.  He denies any changes in her appetite or weight.  Denies any prior surgery or long distance travel before her PE.  ECOG PS- 1  Pain scale- 0   Review of systems- Review of Systems  Constitutional:  Positive for malaise/fatigue. Negative for chills, fever and weight loss.  HENT:  Negative for congestion, ear discharge and nosebleeds.   Eyes:  Negative for blurred vision.  Respiratory:  Negative for cough, hemoptysis, sputum production, shortness of breath and wheezing.   Cardiovascular:  Negative for chest pain, palpitations, orthopnea and claudication.  Gastrointestinal:   Negative for abdominal pain, blood in stool, constipation, diarrhea, heartburn, melena, nausea and vomiting.  Genitourinary:  Negative for dysuria, flank pain, frequency, hematuria and urgency.  Musculoskeletal:  Negative for back pain, joint pain and myalgias.  Skin:  Negative for rash.  Neurological:  Negative for dizziness, tingling, focal weakness, seizures, weakness and headaches.  Endo/Heme/Allergies:  Does not bruise/bleed easily.  Psychiatric/Behavioral:  Negative for depression and suicidal ideas. The patient does not have insomnia.     Allergies  Allergen Reactions   Oxycodone      Stops her breathing   Glipizide      Recurrent hypoglycemia   Lipitor [Atorvastatin ]     Affected balance   Penicillin G     Other reaction(s): Unknown    Patient Active Problem List   Diagnosis Date Noted   Pulmonary embolism (HCC) 06/08/2024   Vertigo 05/18/2021   Acute bronchitis with COPD (HCC) 05/09/2021   Hypertensive retinopathy 02/06/2021   Screening for osteoporosis 01/02/2021   Iron deficiency anemia 04/26/2020   Normocytic anemia 01/06/2020   Abnormal SPEP 01/06/2020   CKD stage 3 due to type 2 diabetes mellitus (HCC) 09/02/2019   Educated about COVID-19 virus infection 02/09/2019   Cough 02/09/2019   Positive FIT (fecal immunochemical test)    Polyp of sigmoid colon    Diabetic nephropathy associated with type 2 diabetes mellitus (HCC) 09/28/2017   Alopecia of scalp 09/28/2017   Breast cancer screening 09/28/2017   Morbid obesity (HCC) 12/23/2014   Statin intolerance 05/23/2013   B12 deficiency 05/23/2013   Hyperlipidemia associated with type 2 diabetes mellitus (HCC) 04/13/2013   Essential hypertension, benign 04/13/2013   Type 2 diabetes  mellitus with nephropathy (HCC)      Past Medical History:  Diagnosis Date   Allergy    Arthritis    knees   Chicken pox    Chronic bronchitis (HCC)    Diabetes mellitus without complication (HCC)    type 2   Hypertension     Phlebitis    Pulmonary embolism (HCC) 1990   after abdominal tumor removal   Vertigo      Past Surgical History:  Procedure Laterality Date   ABDOMINAL HYSTERECTOMY     abdominal tumor Left    size of grapefruit   COLONOSCOPY WITH PROPOFOL  N/A 09/24/2018   Procedure: COLONOSCOPY WITH BIOPSY;  Surgeon: Jinny Carmine, MD;  Location: [facility];  Service: Endoscopy;  Laterality: N/A;  diabetic - oral meds  requests arrival after 8:30   IR ANGIOGRAM PULMONARY BILATERAL SELECTIVE  06/08/2024   IR ANGIOGRAM SELECTIVE EACH ADDITIONAL VESSEL  06/08/2024   IR ANGIOGRAM SELECTIVE EACH ADDITIONAL VESSEL  06/08/2024   IR THROMBECT PRIM MECH ADD (INCLU) MOD SED  06/08/2024   IR THROMBECT PRIM MECH ADD (INCLU) MOD SED  06/08/2024   IR THROMBECT PRIM MECH INIT (INCLU) MOD SED  06/08/2024   IR US  GUIDE VASC ACCESS RIGHT  06/08/2024   POLYPECTOMY N/A 09/24/2018   Procedure: POLYPECTOMY;  Surgeon: Jinny Carmine, MD;  Location: [facility];  Service: Endoscopy;  Laterality: N/A;   TONSILECTOMY, ADENOIDECTOMY, BILATERAL MYRINGOTOMY AND TUBES      Social History   Socioeconomic History   Marital status: Single    Spouse name: Not on file   Number of children: Not on file   Years of education: Not on file   Highest education level: Not on file  Occupational History   Not on file  Tobacco Use   Smoking status: Never   Smokeless tobacco: Never  Vaping Use   Vaping status: Never Used  Substance and Sexual Activity   Alcohol use: Yes    Comment:  rarely - Holidays   Drug use: No   Sexual activity: Never  Other Topics Concern   Not on file  Social History Narrative   Not on file   Social Drivers of Health   Financial Resource Strain: Low Risk  (03/25/2021)   Overall Financial Resource Strain (CARDIA)    Difficulty of Paying Living Expenses: Not hard at all  Food Insecurity: No Food Insecurity (06/10/2024)   Hunger Vital Sign    Worried About Running Out of Food in the Last Year:  Never true    Ran Out of Food in the Last Year: Never true  Transportation Needs: No Transportation Needs (06/10/2024)   PRAPARE - Administrator, Civil Service (Medical): No    Lack of Transportation (Non-Medical): No  Physical Activity: Unknown (08/10/2020)   Exercise Vital Sign    Days of Exercise per Week: 0 days    Minutes of Exercise per Session: Not on file  Stress: Stress Concern Present (03/25/2021)   [employer] of Occupational Health - Occupational Stress Questionnaire    Feeling of Stress : Rather much  Social Connections: Socially Isolated (06/10/2024)   Social Connection and Isolation Panel    Frequency of Communication with Friends and Family: More than three times a week    Frequency of Social Gatherings with Friends and Family: More than three times a week    Attends Religious Services: Never    Database administrator or Organizations: No    Attends  Club or Organization Meetings: Never    Marital Status: Widowed  Intimate Partner Violence: Not At Risk (06/10/2024)   Humiliation, Afraid, Rape, and Kick questionnaire    Fear of Current or Ex-Partner: No    Emotionally Abused: No    Physically Abused: No    Sexually Abused: No     Family History  Problem Relation Age of Onset   Diabetes Mother    Cancer Father    Cancer Paternal Aunt    Cancer Paternal Uncle    Diabetes Paternal Grandmother      Current Outpatient Medications:    amLODipine  (NORVASC ) 2.5 MG tablet, Take 2.5 mg by mouth daily., Disp: , Rfl:    atorvastatin  (LIPITOR) 20 MG tablet, Take 1 tablet by mouth once daily, Disp: 90 tablet, Rfl: 3   Cholecalciferol (VITAMIN D -3) 25 MCG (1000 UT) CAPS, Take 1 capsule by mouth daily., Disp: , Rfl:    cyanocobalamin  1000 MCG tablet, Take 1 tablet (1,000 mcg total) by mouth daily., Disp: 30 tablet, Rfl: 0   glipiZIDE  (GLUCOTROL ) 5 MG tablet, Take 5 mg by mouth daily before breakfast., Disp: , Rfl:    glucose blood test strip, Use as instructed  to check sugars twice daily.  Uncontrolled diabetes mellitus E11.65, Disp: 100 each, Rfl: 12   losartan  (COZAAR ) 50 MG tablet, Take 1 tablet (50 mg total) by mouth daily., Disp: 90 tablet, Rfl: 1   metFORMIN  (GLUCOPHAGE ) 500 MG tablet, TAKE 1 TABLET BY MOUTH TWICE DAILY WITH A MEAL, Disp: 180 tablet, Rfl: 0   Omega-3 Fatty Acids (FISH OIL) 1000 MG CAPS, Take 1 capsule by mouth daily., Disp: , Rfl:    warfarin (COUMADIN ) 5 MG tablet, Take 5 mg by mouth daily., Disp: , Rfl:    Physical exam: There were no vitals filed for this visit. Physical Exam Cardiovascular:     Rate and Rhythm: Normal rate and regular rhythm.     Heart sounds: Normal heart sounds.  Pulmonary:     Effort: Pulmonary effort is normal.     Breath sounds: Normal breath sounds.  Abdominal:     General: Bowel sounds are normal.     Palpations: Abdomen is soft.  Skin:    General: Skin is warm and dry.  Neurological:     Mental Status: She is alert and oriented to person, place, and time.           Latest Ref Rng & Units 06/15/2024   11:20 AM  CMP  Glucose 70 - 99 mg/dL 749   BUN 8 - 23 mg/dL 26   Creatinine 9.55 - 1.00 mg/dL 8.75   Sodium 864 - 854 mmol/L 136   Potassium 3.5 - 5.1 mmol/L 4.6   Chloride 98 - 111 mmol/L 104   CO2 22 - 32 mmol/L 22   Calcium  8.9 - 10.3 mg/dL 9.3       Latest Ref Rng & Units 06/15/2024   11:20 AM  CBC  WBC 4.0 - 10.5 K/uL 8.9   Hemoglobin 12.0 - 15.0 g/dL 89.0   Hematocrit 63.9 - 46.0 % 35.4   Platelets 150 - 400 K/uL 311     No images are attached to the encounter.  VAS US  LOWER EXTREMITY VENOUS (DVT) Result Date: 06/09/2024  Lower Venous DVT Study Patient Name:  BRYN SALINE  Date of Exam:   06/08/2024 Medical Rec #: [id number]         Accession #:    [id number] Date of Birth:  [birth year]         Patient Gender: F Patient Age:   36 years Exam Location:  [facility] Procedure:      VAS US  LOWER EXTREMITY VENOUS (DVT) Referring Phys: MANCEL PLY  --------------------------------------------------------------------------------  Indications: Pulmonary embolism.  Limitations: Bandages (patient status post thrombectomy today), poor ultrasound/tissue interface and body habitus. Comparison Study: No prior study on file Performing Technologist: Alberta Lis RVS  Examination Guidelines: A complete evaluation includes B-mode imaging, spectral Doppler, color Doppler, and power Doppler as needed of all accessible portions of each vessel. Bilateral testing is considered an integral part of a complete examination. Limited examinations for reoccurring indications may be performed as noted. The reflux portion of the exam is performed with the patient in reverse Trendelenburg.  +---------+---------------+---------+-----------+---------------+--------------+ RIGHT    CompressibilityPhasicitySpontaneityProperties     Thrombus Aging +---------+---------------+---------+-----------+---------------+--------------+ CFV                                                        Not                                                                       visualized/ban                                                            dages          +---------+---------------+---------+-----------+---------------+--------------+ SFJ                                                        Not                                                                       visualized/ban                                                            dages          +---------+---------------+---------+-----------+---------------+--------------+ FV Prox  Full           Yes      No         pulsatile  waveform                      +---------+---------------+---------+-----------+---------------+--------------+ FV Mid   Full                                                              +---------+---------------+---------+-----------+---------------+--------------+ FV DistalFull                                                             +---------+---------------+---------+-----------+---------------+--------------+ PFV      Full           Yes      No         pulsatile                                                                 waveform                      +---------+---------------+---------+-----------+---------------+--------------+ POP      Full           Yes      No                                       +---------+---------------+---------+-----------+---------------+--------------+ PTV      Full                                                             +---------+---------------+---------+-----------+---------------+--------------+ PERO     Full                                                             +---------+---------------+---------+-----------+---------------+--------------+   +---------+---------------+---------+-----------+---------------+--------------+ LEFT     CompressibilityPhasicitySpontaneityProperties     Thrombus Aging +---------+---------------+---------+-----------+---------------+--------------+ CFV      Full           Yes      No         pulsatile                                                                 waveform                      +---------+---------------+---------+-----------+---------------+--------------+  SFJ      Full                                                             +---------+---------------+---------+-----------+---------------+--------------+ FV Prox  Full           Yes      No         pulsatile                                                                 waveform                      +---------+---------------+---------+-----------+---------------+--------------+ FV Mid   Full           Yes      No         pulsatile                                                                  waveform                      +---------+---------------+---------+-----------+---------------+--------------+ FV DistalFull                                                             +---------+---------------+---------+-----------+---------------+--------------+ PFV      Full                                                             +---------+---------------+---------+-----------+---------------+--------------+ POP                     Yes      No         pulsatile                                                                 waveform                      +---------+---------------+---------+-----------+---------------+--------------+ PTV      Full                                                             +---------+---------------+---------+-----------+---------------+--------------+  PERO                                                       Not well                                                                  visualized     +---------+---------------+---------+-----------+---------------+--------------+     Summary: RIGHT: - There is no evidence of deep vein thrombosis in the lower extremity. However, portions of this examination were limited- see technologist comments above.  Pulsatile waveforms noted throughout  LEFT: - There is no evidence of deep vein thrombosis in the lower extremity. However, portions of this examination were limited- see technologist comments above.  Pulsatile waveforms noted throughout.  *See table(s) above for measurements and observations. Electronically signed by Fonda Rim on 06/09/2024 at 9:16:04 AM.    Final    ECHOCARDIOGRAM COMPLETE Result Date: 06/08/2024    ECHOCARDIOGRAM REPORT   Patient Name:   ILHAN DEBENEDETTO Date of Exam: 06/08/2024 Medical Rec #:  [id number]        Height:       59.0 in Accession #:    [id number]       Weight:       185.8 lb Date of Birth:   1950-11-06        BSA:          1.788 m Patient Age:    73 years         BP:           120/72 mmHg Patient Gender: F                HR:           82 bpm. Exam Location:  Inpatient Procedure: 2D Echo, Cardiac Doppler and Color Doppler (Both Spectral and Color            Flow Doppler were utilized during procedure). Indications:    Pulmonary Embolus I26.09  History:        Patient has no prior history of Echocardiogram examinations.                 Risk Factors:Diabetes and Hypertension.  Sonographer:    Tinnie Gosling RDCS Referring Phys: [id number] OMAR M ALBUSTAMI IMPRESSIONS  1. Left ventricular ejection fraction, by estimation, is 60 to 65%. The left ventricle has normal function. Left ventricular endocardial border not optimally defined to evaluate regional wall motion. There is mild left ventricular hypertrophy. Left ventricular diastolic parameters are consistent with Grade I diastolic dysfunction (impaired relaxation).  2. Right ventricular systolic function is mildly reduced. The right ventricular size is normal. Tricuspid regurgitation signal is inadequate for assessing PA pressure.  3. A small pericardial effusion is present.  4. The mitral valve is degenerative. Trivial mitral valve regurgitation. No evidence of mitral stenosis.  5. The aortic valve is grossly normal. There is mild calcification of the aortic valve. Aortic valve regurgitation is not visualized. No aortic stenosis is present.  6. The inferior vena cava is normal in size with <50% respiratory variability, suggesting right  atrial pressure of 8 mmHg. FINDINGS  Left Ventricle: Left ventricular ejection fraction, by estimation, is 60 to 65%. The left ventricle has normal function. Left ventricular endocardial border not optimally defined to evaluate regional wall motion. The left ventricular internal cavity size was normal in size. There is mild left ventricular hypertrophy. Left ventricular diastolic parameters are consistent with Grade I  diastolic dysfunction (impaired relaxation). Right Ventricle: The right ventricular size is normal. No increase in right ventricular wall thickness. Right ventricular systolic function is mildly reduced. Tricuspid regurgitation signal is inadequate for assessing PA pressure. The tricuspid regurgitant velocity is 1.90 m/s, and with an assumed right atrial pressure of 8 mmHg, the estimated right ventricular systolic pressure is 22.4 mmHg. Left Atrium: Left atrial size was normal in size. Right Atrium: Right atrial size was normal in size. Pericardium: A small pericardial effusion is present. Mitral Valve: The mitral valve is degenerative in appearance. There is mild calcification of the mitral valve leaflet(s). Trivial mitral valve regurgitation. No evidence of mitral valve stenosis. Tricuspid Valve: The tricuspid valve is grossly normal. Tricuspid valve regurgitation is trivial. No evidence of tricuspid stenosis. Aortic Valve: The aortic valve is grossly normal. There is mild calcification of the aortic valve. Aortic valve regurgitation is not visualized. No aortic stenosis is present. Pulmonic Valve: The pulmonic valve was normal in structure. Pulmonic valve regurgitation is not visualized. No evidence of pulmonic stenosis. Aorta: The aortic root is normal in size and structure. Venous: The inferior vena cava is normal in size with less than 50% respiratory variability, suggesting right atrial pressure of 8 mmHg. IAS/Shunts: No atrial level shunt detected by color flow Doppler.  LEFT VENTRICLE PLAX 2D LVIDd:         3.40 cm   Diastology LVIDs:         2.50 cm   LV e' medial:    6.31 cm/s LV PW:         1.30 cm   LV E/e' medial:  11.4 LV IVS:        1.30 cm   LV e' lateral:   8.16 cm/s LVOT diam:     2.10 cm   LV E/e' lateral: 8.8 LV SV:         66 LV SV Index:   37 LVOT Area:     3.46 cm  RIGHT VENTRICLE            IVC RV S prime:     9.68 cm/s  IVC diam: 2.00 cm TAPSE (M-mode): 1.7 cm LEFT ATRIUM             Index         RIGHT ATRIUM           Index LA diam:        3.70 cm 2.07 cm/m   RA Area:     14.70 cm LA Vol (A2C):   36.9 ml 20.64 ml/m  RA Volume:   36.60 ml  20.47 ml/m LA Vol (A4C):   32.7 ml 18.29 ml/m LA Biplane Vol: 35.8 ml 20.03 ml/m  AORTIC VALVE LVOT Vmax:   92.30 cm/s LVOT Vmean:  67.000 cm/s LVOT VTI:    0.191 m  AORTA Ao Root diam: 2.90 cm Ao Asc diam:  3.20 cm MITRAL VALVE               TRICUSPID VALVE MV Area (PHT): 2.84 cm    TR Peak grad:   14.4 mmHg MV Decel Time: 267  msec    TR Vmax:        190.00 cm/s MV E velocity: 71.80 cm/s MV A velocity: 99.20 cm/s  SHUNTS MV E/A ratio:  0.72        Systemic VTI:  0.19 m                            Systemic Diam: 2.10 cm Soyla Merck MD Electronically signed by Soyla Merck MD Signature Date/Time: 06/08/2024/4:56:52 PM    Final    IR THROMBECT PRIM MECH INIT (INCLU) MOD SED Result Date: 06/08/2024 INDICATION: new PE on Coumadin . continued symptoms and imaging with intermediate-high risk and slightly increased clot burden from previous imaging, elevated biomarkers, RV strain EXAM: BILATERAL PULMONARY ARTERIOGRAM LEFT PULMONARY ARTERIAL THROMBECTOMY RIGHT PULMONARY ARTERIAL THROMBECTOMY ULTRASOUND GUIDANCE FOR VASCULAR ACCESS RIGHT ILIAC VENOGRAM COMPARISON:  CTA from previous day MEDICATIONS: Lidocaine  1% subcutaneous, heparin  6,000 units ANESTHESIA/SEDATION: Intravenous Fentanyl  100mcg and Versed  2mg  were administered as conscious sedation during continuous monitoring of the patient's level of consciousness and physiological / cardiorespiratory status by the radiology RN, with a total moderate sedation time of 90 minutes. Zofran  8 mg for nausea. TECHNIQUE: Informed written consent was obtained from the patient after a thorough discussion of the procedural risks, benefits and alternatives. All questions were addressed. Maximal Sterile Barrier Technique was utilized including caps, mask, sterile gowns, sterile gloves, sterile drape, hand hygiene and skin  antiseptic. A timeout was performed prior to the initiation of the procedure. Patency and complete compressibility of right common femoral vein was confirmed with ultrasound. After surgical prep, and local lidocaine  administration, micropuncture access to right common femoral vein achieved. Outflow right iliac venogram obtained, demonstrating no significant DVT. Micrdilator exchanged over a Bentson wire for a 6 French angled pigtail catheter advanced into the right pulmonary artery. Pressure measurements obtained. Patient was heparinized with 2000 units heparin , and ACT was maintained greater than 200 with additional intermittent boluses as needed. Catheter exchanged for a 5 French vert catheter, negotiated into lower lobe branch right pulmonary artery, removed over a stiff Amplatz wire. Vascular sheath was upsized, and 24 [place] FlowTriever device advanced into right pulmonary artery. Suction thrombectomy performed. Follow-up selective right pulmonary arteriography was obtained. The catheter was then redirected to the origin of the left pulmonary artery for left pulmonary arteriography. Coaxial curved FlowTriever device advanced into distal left pulmonary artery. Suction mechanical thrombectomy performed. Follow-up selective left pulmonary arteriogram obtained. Catheter was retracted into the distal main pulmonary artery for final pulmonary arteriography and pressure measurements. Guidewire, catheter and sheath were removed and hemostasis achieved with aid of 2-0 Ethilon pursestring suture. The patient tolerated the procedure well. FLUOROSCOPY TIME:  Radiation Exposure Index (as provided by the fluoroscopic device): 169 mGy air Kerma COMPLICATIONS: None immediate. FINDINGS: Outflow right iliac venogram  show no  thrombus or occlusion. Initial right pulmonary arterial pressure69/23(40) mmHg. Selective right pulmonary arteriogram shows central partially occlusive clot extending into the intralobar branch and lower  lobe segmental branches. After right pulmonary arterial selective suction thrombectomy, there is clearance of the central clot with some residual partially occlusive clot in peripheral segmental branches. Selective left pulmonary arteriogram demonstrates partially occlusive thrombus in the proximal left pulmonary artery . After left pulmonary artery selective suction thrombectomy, clearance of thrombus from the central left pulmonary artery with improved branch perfusion. Final pulmonary arterial pressure 53/18 (31)mmHg. IMPRESSION: 1. Negative for right iliac venous in a thrombus. 2. Bilateral central pulmonary emboli  with markedly elevated pulmonary arterial pressures. 3. Good response to bilateral selective pulmonary arterial suction thrombectomy, with improved pulmonary arterial pressures. Electronically Signed   By: JONETTA Faes M.D.   On: 06/08/2024 14:46   IR US  Guide Vasc Access Right Result Date: 06/08/2024 INDICATION: new PE on Coumadin . continued symptoms and imaging with intermediate-high risk and slightly increased clot burden from previous imaging, elevated biomarkers, RV strain EXAM: BILATERAL PULMONARY ARTERIOGRAM LEFT PULMONARY ARTERIAL THROMBECTOMY RIGHT PULMONARY ARTERIAL THROMBECTOMY ULTRASOUND GUIDANCE FOR VASCULAR ACCESS RIGHT ILIAC VENOGRAM COMPARISON:  CTA from previous day MEDICATIONS: Lidocaine  1% subcutaneous, heparin  6,000 units ANESTHESIA/SEDATION: Intravenous Fentanyl  100mcg and Versed  2mg  were administered as conscious sedation during continuous monitoring of the patient's level of consciousness and physiological / cardiorespiratory status by the radiology RN, with a total moderate sedation time of 90 minutes. Zofran  8 mg for nausea. TECHNIQUE: Informed written consent was obtained from the patient after a thorough discussion of the procedural risks, benefits and alternatives. All questions were addressed. Maximal Sterile Barrier Technique was utilized including caps, mask, sterile  gowns, sterile gloves, sterile drape, hand hygiene and skin antiseptic. A timeout was performed prior to the initiation of the procedure. Patency and complete compressibility of right common femoral vein was confirmed with ultrasound. After surgical prep, and local lidocaine  administration, micropuncture access to right common femoral vein achieved. Outflow right iliac venogram obtained, demonstrating no significant DVT. Micrdilator exchanged over a Bentson wire for a 6 French angled pigtail catheter advanced into the right pulmonary artery. Pressure measurements obtained. Patient was heparinized with 2000 units heparin , and ACT was maintained greater than 200 with additional intermittent boluses as needed. Catheter exchanged for a 5 French vert catheter, negotiated into lower lobe branch right pulmonary artery, removed over a stiff Amplatz wire. Vascular sheath was upsized, and 24 [place] FlowTriever device advanced into right pulmonary artery. Suction thrombectomy performed. Follow-up selective right pulmonary arteriography was obtained. The catheter was then redirected to the origin of the left pulmonary artery for left pulmonary arteriography. Coaxial curved FlowTriever device advanced into distal left pulmonary artery. Suction mechanical thrombectomy performed. Follow-up selective left pulmonary arteriogram obtained. Catheter was retracted into the distal main pulmonary artery for final pulmonary arteriography and pressure measurements. Guidewire, catheter and sheath were removed and hemostasis achieved with aid of 2-0 Ethilon pursestring suture. The patient tolerated the procedure well. FLUOROSCOPY TIME:  Radiation Exposure Index (as provided by the fluoroscopic device): 169 mGy air Kerma COMPLICATIONS: None immediate. FINDINGS: Outflow right iliac venogram  show no  thrombus or occlusion. Initial right pulmonary arterial pressure69/23(40) mmHg. Selective right pulmonary arteriogram shows central partially  occlusive clot extending into the intralobar branch and lower lobe segmental branches. After right pulmonary arterial selective suction thrombectomy, there is clearance of the central clot with some residual partially occlusive clot in peripheral segmental branches. Selective left pulmonary arteriogram demonstrates partially occlusive thrombus in the proximal left pulmonary artery . After left pulmonary artery selective suction thrombectomy, clearance of thrombus from the central left pulmonary artery with improved branch perfusion. Final pulmonary arterial pressure 53/18 (31)mmHg. IMPRESSION: 1. Negative for right iliac venous in a thrombus. 2. Bilateral central pulmonary emboli with markedly elevated pulmonary arterial pressures. 3. Good response to bilateral selective pulmonary arterial suction thrombectomy, with improved pulmonary arterial pressures. Electronically Signed   By: JONETTA Faes M.D.   On: 06/08/2024 14:46   IR THROMBECT PRIM MECH ADD (INCLU) MOD SED Result Date: 06/08/2024 INDICATION: new PE on Coumadin . continued symptoms and imaging with intermediate-high risk  and slightly increased clot burden from previous imaging, elevated biomarkers, RV strain EXAM: BILATERAL PULMONARY ARTERIOGRAM LEFT PULMONARY ARTERIAL THROMBECTOMY RIGHT PULMONARY ARTERIAL THROMBECTOMY ULTRASOUND GUIDANCE FOR VASCULAR ACCESS RIGHT ILIAC VENOGRAM COMPARISON:  CTA from previous day MEDICATIONS: Lidocaine  1% subcutaneous, heparin  6,000 units ANESTHESIA/SEDATION: Intravenous Fentanyl  100mcg and Versed  2mg  were administered as conscious sedation during continuous monitoring of the patient's level of consciousness and physiological / cardiorespiratory status by the radiology RN, with a total moderate sedation time of 90 minutes. Zofran  8 mg for nausea. TECHNIQUE: Informed written consent was obtained from the patient after a thorough discussion of the procedural risks, benefits and alternatives. All questions were addressed.  Maximal Sterile Barrier Technique was utilized including caps, mask, sterile gowns, sterile gloves, sterile drape, hand hygiene and skin antiseptic. A timeout was performed prior to the initiation of the procedure. Patency and complete compressibility of right common femoral vein was confirmed with ultrasound. After surgical prep, and local lidocaine  administration, micropuncture access to right common femoral vein achieved. Outflow right iliac venogram obtained, demonstrating no significant DVT. Micrdilator exchanged over a Bentson wire for a 6 French angled pigtail catheter advanced into the right pulmonary artery. Pressure measurements obtained. Patient was heparinized with 2000 units heparin , and ACT was maintained greater than 200 with additional intermittent boluses as needed. Catheter exchanged for a 5 French vert catheter, negotiated into lower lobe branch right pulmonary artery, removed over a stiff Amplatz wire. Vascular sheath was upsized, and 24 [place] FlowTriever device advanced into right pulmonary artery. Suction thrombectomy performed. Follow-up selective right pulmonary arteriography was obtained. The catheter was then redirected to the origin of the left pulmonary artery for left pulmonary arteriography. Coaxial curved FlowTriever device advanced into distal left pulmonary artery. Suction mechanical thrombectomy performed. Follow-up selective left pulmonary arteriogram obtained. Catheter was retracted into the distal main pulmonary artery for final pulmonary arteriography and pressure measurements. Guidewire, catheter and sheath were removed and hemostasis achieved with aid of 2-0 Ethilon pursestring suture. The patient tolerated the procedure well. FLUOROSCOPY TIME:  Radiation Exposure Index (as provided by the fluoroscopic device): 169 mGy air Kerma COMPLICATIONS: None immediate. FINDINGS: Outflow right iliac venogram  show no  thrombus or occlusion. Initial right pulmonary arterial  pressure69/23(40) mmHg. Selective right pulmonary arteriogram shows central partially occlusive clot extending into the intralobar branch and lower lobe segmental branches. After right pulmonary arterial selective suction thrombectomy, there is clearance of the central clot with some residual partially occlusive clot in peripheral segmental branches. Selective left pulmonary arteriogram demonstrates partially occlusive thrombus in the proximal left pulmonary artery . After left pulmonary artery selective suction thrombectomy, clearance of thrombus from the central left pulmonary artery with improved branch perfusion. Final pulmonary arterial pressure 53/18 (31)mmHg. IMPRESSION: 1. Negative for right iliac venous in a thrombus. 2. Bilateral central pulmonary emboli with markedly elevated pulmonary arterial pressures. 3. Good response to bilateral selective pulmonary arterial suction thrombectomy, with improved pulmonary arterial pressures. Electronically Signed   By: JONETTA Faes M.D.   On: 06/08/2024 14:46   IR THROMBECT PRIM MECH ADD (INCLU) MOD SED Result Date: 06/08/2024 INDICATION: new PE on Coumadin . continued symptoms and imaging with intermediate-high risk and slightly increased clot burden from previous imaging, elevated biomarkers, RV strain EXAM: BILATERAL PULMONARY ARTERIOGRAM LEFT PULMONARY ARTERIAL THROMBECTOMY RIGHT PULMONARY ARTERIAL THROMBECTOMY ULTRASOUND GUIDANCE FOR VASCULAR ACCESS RIGHT ILIAC VENOGRAM COMPARISON:  CTA from previous day MEDICATIONS: Lidocaine  1% subcutaneous, heparin  6,000 units ANESTHESIA/SEDATION: Intravenous Fentanyl  100mcg and Versed  2mg  were administered as conscious sedation during  continuous monitoring of the patient's level of consciousness and physiological / cardiorespiratory status by the radiology RN, with a total moderate sedation time of 90 minutes. Zofran  8 mg for nausea. TECHNIQUE: Informed written consent was obtained from the patient after a thorough discussion  of the procedural risks, benefits and alternatives. All questions were addressed. Maximal Sterile Barrier Technique was utilized including caps, mask, sterile gowns, sterile gloves, sterile drape, hand hygiene and skin antiseptic. A timeout was performed prior to the initiation of the procedure. Patency and complete compressibility of right common femoral vein was confirmed with ultrasound. After surgical prep, and local lidocaine  administration, micropuncture access to right common femoral vein achieved. Outflow right iliac venogram obtained, demonstrating no significant DVT. Micrdilator exchanged over a Bentson wire for a 6 French angled pigtail catheter advanced into the right pulmonary artery. Pressure measurements obtained. Patient was heparinized with 2000 units heparin , and ACT was maintained greater than 200 with additional intermittent boluses as needed. Catheter exchanged for a 5 French vert catheter, negotiated into lower lobe branch right pulmonary artery, removed over a stiff Amplatz wire. Vascular sheath was upsized, and 24 [place] FlowTriever device advanced into right pulmonary artery. Suction thrombectomy performed. Follow-up selective right pulmonary arteriography was obtained. The catheter was then redirected to the origin of the left pulmonary artery for left pulmonary arteriography. Coaxial curved FlowTriever device advanced into distal left pulmonary artery. Suction mechanical thrombectomy performed. Follow-up selective left pulmonary arteriogram obtained. Catheter was retracted into the distal main pulmonary artery for final pulmonary arteriography and pressure measurements. Guidewire, catheter and sheath were removed and hemostasis achieved with aid of 2-0 Ethilon pursestring suture. The patient tolerated the procedure well. FLUOROSCOPY TIME:  Radiation Exposure Index (as provided by the fluoroscopic device): 169 mGy air Kerma COMPLICATIONS: None immediate. FINDINGS: Outflow right iliac  venogram  show no  thrombus or occlusion. Initial right pulmonary arterial pressure69/23(40) mmHg. Selective right pulmonary arteriogram shows central partially occlusive clot extending into the intralobar branch and lower lobe segmental branches. After right pulmonary arterial selective suction thrombectomy, there is clearance of the central clot with some residual partially occlusive clot in peripheral segmental branches. Selective left pulmonary arteriogram demonstrates partially occlusive thrombus in the proximal left pulmonary artery . After left pulmonary artery selective suction thrombectomy, clearance of thrombus from the central left pulmonary artery with improved branch perfusion. Final pulmonary arterial pressure 53/18 (31)mmHg. IMPRESSION: 1. Negative for right iliac venous in a thrombus. 2. Bilateral central pulmonary emboli with markedly elevated pulmonary arterial pressures. 3. Good response to bilateral selective pulmonary arterial suction thrombectomy, with improved pulmonary arterial pressures. Electronically Signed   By: JONETTA Faes M.D.   On: 06/08/2024 14:46   IR Angiogram Pulmonary Bilateral Selective Result Date: 06/08/2024 INDICATION: new PE on Coumadin . continued symptoms and imaging with intermediate-high risk and slightly increased clot burden from previous imaging, elevated biomarkers, RV strain EXAM: BILATERAL PULMONARY ARTERIOGRAM LEFT PULMONARY ARTERIAL THROMBECTOMY RIGHT PULMONARY ARTERIAL THROMBECTOMY ULTRASOUND GUIDANCE FOR VASCULAR ACCESS RIGHT ILIAC VENOGRAM COMPARISON:  CTA from previous day MEDICATIONS: Lidocaine  1% subcutaneous, heparin  6,000 units ANESTHESIA/SEDATION: Intravenous Fentanyl  100mcg and Versed  2mg  were administered as conscious sedation during continuous monitoring of the patient's level of consciousness and physiological / cardiorespiratory status by the radiology RN, with a total moderate sedation time of 90 minutes. Zofran  8 mg for nausea. TECHNIQUE: Informed  written consent was obtained from the patient after a thorough discussion of the procedural risks, benefits and alternatives. All questions were addressed. Maximal Sterile Barrier Technique was  utilized including caps, mask, sterile gowns, sterile gloves, sterile drape, hand hygiene and skin antiseptic. A timeout was performed prior to the initiation of the procedure. Patency and complete compressibility of right common femoral vein was confirmed with ultrasound. After surgical prep, and local lidocaine  administration, micropuncture access to right common femoral vein achieved. Outflow right iliac venogram obtained, demonstrating no significant DVT. Micrdilator exchanged over a Bentson wire for a 6 French angled pigtail catheter advanced into the right pulmonary artery. Pressure measurements obtained. Patient was heparinized with 2000 units heparin , and ACT was maintained greater than 200 with additional intermittent boluses as needed. Catheter exchanged for a 5 French vert catheter, negotiated into lower lobe branch right pulmonary artery, removed over a stiff Amplatz wire. Vascular sheath was upsized, and 24 [place] FlowTriever device advanced into right pulmonary artery. Suction thrombectomy performed. Follow-up selective right pulmonary arteriography was obtained. The catheter was then redirected to the origin of the left pulmonary artery for left pulmonary arteriography. Coaxial curved FlowTriever device advanced into distal left pulmonary artery. Suction mechanical thrombectomy performed. Follow-up selective left pulmonary arteriogram obtained. Catheter was retracted into the distal main pulmonary artery for final pulmonary arteriography and pressure measurements. Guidewire, catheter and sheath were removed and hemostasis achieved with aid of 2-0 Ethilon pursestring suture. The patient tolerated the procedure well. FLUOROSCOPY TIME:  Radiation Exposure Index (as provided by the fluoroscopic device): 169 mGy air  Kerma COMPLICATIONS: None immediate. FINDINGS: Outflow right iliac venogram  show no  thrombus or occlusion. Initial right pulmonary arterial pressure69/23(40) mmHg. Selective right pulmonary arteriogram shows central partially occlusive clot extending into the intralobar branch and lower lobe segmental branches. After right pulmonary arterial selective suction thrombectomy, there is clearance of the central clot with some residual partially occlusive clot in peripheral segmental branches. Selective left pulmonary arteriogram demonstrates partially occlusive thrombus in the proximal left pulmonary artery . After left pulmonary artery selective suction thrombectomy, clearance of thrombus from the central left pulmonary artery with improved branch perfusion. Final pulmonary arterial pressure 53/18 (31)mmHg. IMPRESSION: 1. Negative for right iliac venous in a thrombus. 2. Bilateral central pulmonary emboli with markedly elevated pulmonary arterial pressures. 3. Good response to bilateral selective pulmonary arterial suction thrombectomy, with improved pulmonary arterial pressures. Electronically Signed   By: JONETTA Faes M.D.   On: 06/08/2024 14:46   IR Angiogram Selective Each Additional Vessel Result Date: 06/08/2024 INDICATION: new PE on Coumadin . continued symptoms and imaging with intermediate-high risk and slightly increased clot burden from previous imaging, elevated biomarkers, RV strain EXAM: BILATERAL PULMONARY ARTERIOGRAM LEFT PULMONARY ARTERIAL THROMBECTOMY RIGHT PULMONARY ARTERIAL THROMBECTOMY ULTRASOUND GUIDANCE FOR VASCULAR ACCESS RIGHT ILIAC VENOGRAM COMPARISON:  CTA from previous day MEDICATIONS: Lidocaine  1% subcutaneous, heparin  6,000 units ANESTHESIA/SEDATION: Intravenous Fentanyl  100mcg and Versed  2mg  were administered as conscious sedation during continuous monitoring of the patient's level of consciousness and physiological / cardiorespiratory status by the radiology RN, with a total moderate  sedation time of 90 minutes. Zofran  8 mg for nausea. TECHNIQUE: Informed written consent was obtained from the patient after a thorough discussion of the procedural risks, benefits and alternatives. All questions were addressed. Maximal Sterile Barrier Technique was utilized including caps, mask, sterile gowns, sterile gloves, sterile drape, hand hygiene and skin antiseptic. A timeout was performed prior to the initiation of the procedure. Patency and complete compressibility of right common femoral vein was confirmed with ultrasound. After surgical prep, and local lidocaine  administration, micropuncture access to right common femoral vein achieved. Outflow right iliac venogram obtained, demonstrating  no significant DVT. Micrdilator exchanged over a Bentson wire for a 6 French angled pigtail catheter advanced into the right pulmonary artery. Pressure measurements obtained. Patient was heparinized with 2000 units heparin , and ACT was maintained greater than 200 with additional intermittent boluses as needed. Catheter exchanged for a 5 French vert catheter, negotiated into lower lobe branch right pulmonary artery, removed over a stiff Amplatz wire. Vascular sheath was upsized, and 24 [place] FlowTriever device advanced into right pulmonary artery. Suction thrombectomy performed. Follow-up selective right pulmonary arteriography was obtained. The catheter was then redirected to the origin of the left pulmonary artery for left pulmonary arteriography. Coaxial curved FlowTriever device advanced into distal left pulmonary artery. Suction mechanical thrombectomy performed. Follow-up selective left pulmonary arteriogram obtained. Catheter was retracted into the distal main pulmonary artery for final pulmonary arteriography and pressure measurements. Guidewire, catheter and sheath were removed and hemostasis achieved with aid of 2-0 Ethilon pursestring suture. The patient tolerated the procedure well. FLUOROSCOPY TIME:   Radiation Exposure Index (as provided by the fluoroscopic device): 169 mGy air Kerma COMPLICATIONS: None immediate. FINDINGS: Outflow right iliac venogram  show no  thrombus or occlusion. Initial right pulmonary arterial pressure69/23(40) mmHg. Selective right pulmonary arteriogram shows central partially occlusive clot extending into the intralobar branch and lower lobe segmental branches. After right pulmonary arterial selective suction thrombectomy, there is clearance of the central clot with some residual partially occlusive clot in peripheral segmental branches. Selective left pulmonary arteriogram demonstrates partially occlusive thrombus in the proximal left pulmonary artery . After left pulmonary artery selective suction thrombectomy, clearance of thrombus from the central left pulmonary artery with improved branch perfusion. Final pulmonary arterial pressure 53/18 (31)mmHg. IMPRESSION: 1. Negative for right iliac venous in a thrombus. 2. Bilateral central pulmonary emboli with markedly elevated pulmonary arterial pressures. 3. Good response to bilateral selective pulmonary arterial suction thrombectomy, with improved pulmonary arterial pressures. Electronically Signed   By: JONETTA Faes M.D.   On: 06/08/2024 14:46   IR Angiogram Selective Each Additional Vessel Result Date: 06/08/2024 INDICATION: new PE on Coumadin . continued symptoms and imaging with intermediate-high risk and slightly increased clot burden from previous imaging, elevated biomarkers, RV strain EXAM: BILATERAL PULMONARY ARTERIOGRAM LEFT PULMONARY ARTERIAL THROMBECTOMY RIGHT PULMONARY ARTERIAL THROMBECTOMY ULTRASOUND GUIDANCE FOR VASCULAR ACCESS RIGHT ILIAC VENOGRAM COMPARISON:  CTA from previous day MEDICATIONS: Lidocaine  1% subcutaneous, heparin  6,000 units ANESTHESIA/SEDATION: Intravenous Fentanyl  100mcg and Versed  2mg  were administered as conscious sedation during continuous monitoring of the patient's level of consciousness and  physiological / cardiorespiratory status by the radiology RN, with a total moderate sedation time of 90 minutes. Zofran  8 mg for nausea. TECHNIQUE: Informed written consent was obtained from the patient after a thorough discussion of the procedural risks, benefits and alternatives. All questions were addressed. Maximal Sterile Barrier Technique was utilized including caps, mask, sterile gowns, sterile gloves, sterile drape, hand hygiene and skin antiseptic. A timeout was performed prior to the initiation of the procedure. Patency and complete compressibility of right common femoral vein was confirmed with ultrasound. After surgical prep, and local lidocaine  administration, micropuncture access to right common femoral vein achieved. Outflow right iliac venogram obtained, demonstrating no significant DVT. Micrdilator exchanged over a Bentson wire for a 6 French angled pigtail catheter advanced into the right pulmonary artery. Pressure measurements obtained. Patient was heparinized with 2000 units heparin , and ACT was maintained greater than 200 with additional intermittent boluses as needed. Catheter exchanged for a 5 French vert catheter, negotiated into lower lobe branch right pulmonary  artery, removed over a stiff Amplatz wire. Vascular sheath was upsized, and 24 [place] FlowTriever device advanced into right pulmonary artery. Suction thrombectomy performed. Follow-up selective right pulmonary arteriography was obtained. The catheter was then redirected to the origin of the left pulmonary artery for left pulmonary arteriography. Coaxial curved FlowTriever device advanced into distal left pulmonary artery. Suction mechanical thrombectomy performed. Follow-up selective left pulmonary arteriogram obtained. Catheter was retracted into the distal main pulmonary artery for final pulmonary arteriography and pressure measurements. Guidewire, catheter and sheath were removed and hemostasis achieved with aid of 2-0 Ethilon  pursestring suture. The patient tolerated the procedure well. FLUOROSCOPY TIME:  Radiation Exposure Index (as provided by the fluoroscopic device): 169 mGy air Kerma COMPLICATIONS: None immediate. FINDINGS: Outflow right iliac venogram  show no  thrombus or occlusion. Initial right pulmonary arterial pressure69/23(40) mmHg. Selective right pulmonary arteriogram shows central partially occlusive clot extending into the intralobar branch and lower lobe segmental branches. After right pulmonary arterial selective suction thrombectomy, there is clearance of the central clot with some residual partially occlusive clot in peripheral segmental branches. Selective left pulmonary arteriogram demonstrates partially occlusive thrombus in the proximal left pulmonary artery . After left pulmonary artery selective suction thrombectomy, clearance of thrombus from the central left pulmonary artery with improved branch perfusion. Final pulmonary arterial pressure 53/18 (31)mmHg. IMPRESSION: 1. Negative for right iliac venous in a thrombus. 2. Bilateral central pulmonary emboli with markedly elevated pulmonary arterial pressures. 3. Good response to bilateral selective pulmonary arterial suction thrombectomy, with improved pulmonary arterial pressures. Electronically Signed   By: JONETTA Faes M.D.   On: 06/08/2024 14:46   CT Angio Chest PE W and/or Wo Contrast Addendum Date: 06/07/2024 ADDENDUM REPORT: 06/07/2024 23:36 ADDENDUM: Right-sided thrombus in the distal right main pulmonary artery and right upper lobe has slightly increased compared to 05/30/2024. These results were called by telephone at the time of interpretation on 06/07/2024 at 11:36 pm to provider KEVIN PADUCHOWSKI , who verbally acknowledged these results. Electronically Signed   By: Greig Pique M.D.   On: 06/07/2024 23:36   Result Date: 06/07/2024 CLINICAL DATA:  Shortness of breath.  Near syncope. EXAM: CT ANGIOGRAPHY CHEST WITH CONTRAST TECHNIQUE: Multidetector  CT imaging of the chest was performed using the standard protocol during bolus administration of intravenous contrast. Multiplanar CT image reconstructions and MIPs were obtained to evaluate the vascular anatomy. RADIATION DOSE REDUCTION: This exam was performed according to the departmental dose-optimization program which includes automated exposure control, adjustment of the mA and/or kV according to patient size and/or use of iterative reconstruction technique. CONTRAST:  75mL OMNIPAQUE  IOHEXOL  350 MG/ML SOLN COMPARISON:  Chest CT 05/30/2024 FINDINGS: Cardiovascular: There is adequate opacification of the pulmonary arteries to the segmental level. Again seen are extensive bilateral pulmonary emboli within the distal main pulmonary arteries bilaterally extending into all lobes to the segmental level. Overall distribution and burden of thrombus has not significantly changed compared to 05/30/2024. The heart is normal in size. There is a new small pericardial effusion. Aorta is normal in size. Mediastinum/Nodes: No enlarged mediastinal, hilar, or axillary lymph nodes. Thyroid  gland, trachea, and esophagus demonstrate no significant findings. Lungs/Pleura: There are multiple new areas of ground-glass opacities predominantly in the bilateral upper lobes and minimally in the lower lobes and right middle lobe. These may represent pulmonary infarcts. Infectious etiology not excluded. No pleural effusion or pneumothorax. Upper Abdomen: Left renal atrophy again noted. Musculoskeletal: No chest wall abnormality. No acute or significant osseous findings. Review of the  MIP images confirms the above findings. IMPRESSION: 1. Stable distribution and burden of extensive bilateral pulmonary emboli. Positive for acute PE with CT evidence of right heart strain (RV/LV Ratio = 1.3) consistent with at least submassive (intermediate risk) PE. The presence of right heart strain has been associated with an increased risk of morbidity and  mortality. 2. New small pericardial effusion. 3. New multifocal ground-glass opacities in the lungs may represent pulmonary infarcts. Infectious etiology not excluded. Electronically Signed: By: Greig Pique M.D. On: 06/07/2024 23:30   DG Chest 2 View Result Date: 06/07/2024 CLINICAL DATA:  Shortness of breath, history of pulmonary embolism EXAM: CHEST - 2 VIEW COMPARISON:  05/30/2024 FINDINGS: Cardiac shadow is within normal limits. Lungs are well aerated bilaterally. No focal infiltrate or effusion is seen. No bony abnormality is noted. IMPRESSION: No active cardiopulmonary disease. Electronically Signed   By: Oneil Devonshire M.D.   On: 06/07/2024 20:34    Assessment and plan- Patient is a 73 y.o. female referred for history of acute pulmonary embolism  Patient had prior history of pulmonary embolism back in 1990 when she took Coumadin  for 6 years and then stopped taking it.  Subsequently she had a recurrent episode of PE that was at least submassive with evidence of cor pulmonale in September 2025 and patient was restarted on Coumadin .  Patient states that her insurance will not cover Eliquis and is associated with a heavy co-pay.  She wishes to remain on Coumadin .  INR is therapeutic today and is being managed by her primary care doctor.  Goal INR would be between 2 and 3.  I will proceed with hypercoagulable workup today including protein C protein S Antithrombin III, factor V Leiden and prothrombin gene mutation and antiphospholipid antibody syndrome testing.  We also discussed considering CT abdomen pelvis with contrast to rule out malignancy given unprovoked massive pulmonary embolism.  She would like to wait on the results of her hypercoagulable workup before proceeding with CT abdomen.  I will see her back in 2 weeks time to discuss results of blood work and further management   Thank you for this kind referral and the opportunity to participate in the care of this patient   Visit  Diagnosis 1. Other acute pulmonary embolism with acute cor pulmonale (HCC)     Dr. Annah Skene, MD, MPH [facility] at [facility] [phone] 06/25/2024

## 2024-06-25 NOTE — Progress Notes (Unsigned)
 Clemens / blacked out within past 3 months, went to hospital and that's how she found out about blood clot.  Allergy: Fentanyl  (very nauseated).  Lipitor is listed as an allergy (reason noted intolerance; says sometimes she is off balance a lot) but she is currently taking it.  Was dizzy a lot yesterday so she didn't take her blood pressure medication this morning. Every once a while has a bee sting in chest; was told by doctor it's a pulled muscle; is scheduled for echo on the 29th. New medications: Colace, B12.

## 2024-06-26 ENCOUNTER — Encounter: Payer: Self-pay | Admitting: Oncology

## 2024-06-27 LAB — PROTEIN C, TOTAL: Protein C, Total: 46 % — ABNORMAL LOW (ref 60–150)

## 2024-06-28 LAB — CARDIOLIPIN ANTIBODIES, IGG, IGM, IGA
Anticardiolipin IgA: <9 U/mL (ref 0–11)
Anticardiolipin IgG: <9 GPL U/mL (ref 0–14)
Anticardiolipin IgM: 12 [MPL'U]/mL (ref 0–12)

## 2024-06-28 LAB — PROTEIN S PANEL
Protein S Activity: 14 % — ABNORMAL LOW (ref 63–140)
Protein S Ag, Free: 35 % — ABNORMAL LOW (ref 61–136)
Protein S Ag, Total: 74 % (ref 60–150)

## 2024-06-28 LAB — BETA-2-GLYCOPROTEIN I ABS, IGG/M/A
Beta-2 Glyco I IgG: <9 GPI IgG units (ref 0–20)
Beta-2-Glycoprotein I IgA: <9 GPI IgA units (ref 0–25)
Beta-2-Glycoprotein I IgM: <9 GPI IgM units (ref 0–32)

## 2024-06-28 LAB — HEXAGONAL PHASE PHOSPHOLIPID: Hex Phosph Neut Test: 3 s (ref 0–11)

## 2024-06-28 LAB — HEX PHASE PHOSPHOLIPID REFLEX

## 2024-07-01 LAB — PROTHROMBIN GENE MUTATION

## 2024-07-01 LAB — FACTOR 5 LEIDEN

## 2024-07-05 LAB — LUPUS ANTICOAGULANT
DRVVT: 61.8 s — ABNORMAL HIGH (ref 0.0–47.0)
PTT Lupus Anticoagulant: 60.7 s — ABNORMAL HIGH (ref 0.0–43.5)
Thrombin Time: 21.1 s (ref 0.0–23.0)
dPT Confirm Ratio: UNDETERMINED ratio
dPT: 109.8 s — ABNORMAL HIGH (ref 0.0–47.6)

## 2024-07-05 LAB — DRVVT MIX: dRVVT Mix: 41.1 s — ABNORMAL HIGH (ref 0.0–40.4)

## 2024-07-05 LAB — HEXAGONAL PHASE PHOSPHOLIPID: Hexagonal Phase Phospholipid: 3 s (ref 0–11)

## 2024-07-05 LAB — DRVVT CONFIRM: dRVVT Confirm: 0.8 ratio (ref 0.8–1.2)

## 2024-07-05 LAB — PTT-LA MIX: PTT-LA Mix: 43.2 s — ABNORMAL HIGH (ref 0.0–40.5)

## 2024-07-09 ENCOUNTER — Telehealth: Payer: Self-pay | Admitting: Student in an Organized Health Care Education/Training Program

## 2024-07-09 NOTE — Telephone Encounter (Signed)
 This patient just had Echo done on 06/08/24 does she still need the echo that you order 06/05/24

## 2024-07-10 NOTE — Telephone Encounter (Signed)
 You see her on 10/23 and the echo is scheduled on 07/24/24 should I CXL or at least wait until 10/23 when you see her

## 2024-07-16 ENCOUNTER — Inpatient Hospital Stay: Payer: Medicare (Managed Care) | Attending: Oncology

## 2024-07-16 ENCOUNTER — Encounter: Payer: Self-pay | Admitting: Oncology

## 2024-07-16 ENCOUNTER — Inpatient Hospital Stay (HOSPITAL_BASED_OUTPATIENT_CLINIC_OR_DEPARTMENT_OTHER): Payer: Medicare (Managed Care) | Admitting: Oncology

## 2024-07-16 ENCOUNTER — Ambulatory Visit: Payer: Self-pay | Admitting: Oncology

## 2024-07-16 VITALS — BP 142/72 | HR 68 | Temp 96.3°F | Resp 20 | Wt 178.2 lb

## 2024-07-16 DIAGNOSIS — Z8672 Personal history of thrombophlebitis: Secondary | ICD-10-CM | POA: Insufficient documentation

## 2024-07-16 DIAGNOSIS — Z880 Allergy status to penicillin: Secondary | ICD-10-CM | POA: Diagnosis not present

## 2024-07-16 DIAGNOSIS — Z604 Social exclusion and rejection: Secondary | ICD-10-CM | POA: Insufficient documentation

## 2024-07-16 DIAGNOSIS — Z86718 Personal history of other venous thrombosis and embolism: Secondary | ICD-10-CM | POA: Diagnosis not present

## 2024-07-16 DIAGNOSIS — Z809 Family history of malignant neoplasm, unspecified: Secondary | ICD-10-CM | POA: Diagnosis not present

## 2024-07-16 DIAGNOSIS — I129 Hypertensive chronic kidney disease with stage 1 through stage 4 chronic kidney disease, or unspecified chronic kidney disease: Secondary | ICD-10-CM | POA: Insufficient documentation

## 2024-07-16 DIAGNOSIS — Z885 Allergy status to narcotic agent status: Secondary | ICD-10-CM | POA: Insufficient documentation

## 2024-07-16 DIAGNOSIS — Z79899 Other long term (current) drug therapy: Secondary | ICD-10-CM | POA: Diagnosis not present

## 2024-07-16 DIAGNOSIS — J42 Unspecified chronic bronchitis: Secondary | ICD-10-CM | POA: Diagnosis not present

## 2024-07-16 DIAGNOSIS — N1831 Chronic kidney disease, stage 3a: Secondary | ICD-10-CM | POA: Insufficient documentation

## 2024-07-16 DIAGNOSIS — I2609 Other pulmonary embolism with acute cor pulmonale: Secondary | ICD-10-CM | POA: Diagnosis not present

## 2024-07-16 DIAGNOSIS — Z833 Family history of diabetes mellitus: Secondary | ICD-10-CM | POA: Diagnosis not present

## 2024-07-16 DIAGNOSIS — Z7901 Long term (current) use of anticoagulants: Secondary | ICD-10-CM | POA: Diagnosis not present

## 2024-07-16 DIAGNOSIS — E1122 Type 2 diabetes mellitus with diabetic chronic kidney disease: Secondary | ICD-10-CM | POA: Diagnosis not present

## 2024-07-16 DIAGNOSIS — Z90710 Acquired absence of both cervix and uterus: Secondary | ICD-10-CM | POA: Insufficient documentation

## 2024-07-16 DIAGNOSIS — Z86711 Personal history of pulmonary embolism: Secondary | ICD-10-CM | POA: Insufficient documentation

## 2024-07-16 LAB — PROTIME-INR
INR: 1.5 — ABNORMAL HIGH (ref 0.8–1.2)
Prothrombin Time: 19.3 s — ABNORMAL HIGH (ref 11.4–15.2)

## 2024-07-16 NOTE — Progress Notes (Signed)
 Hematology/Oncology Consult note [facility]  [phone] [phone]  Patient Care Team: Myra Geni ORN, FNP as PCP - General (Family Medicine) Lateef, Munsoor, MD (Nephrology)   Name of the patient: Jeanette Spencer  [id number]  September 23, 1951   Date of visit: 07/16/24  Diagnosis-unprovoked recurrent pulmonary embolism  Chief complaint/ Reason for visit-discuss results of hypercoagulable blood work  Heme/Onc history: Patient is a 73 year old female with a past medical history significant for stage III CKD hypertension, type 2 diabetes and osteoarthritis.  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  She was on Coumadin  for 6 years and was taken off eventually.  She was admitted to [facility] [place]  on 05/31/2024 with new diagnosis of pulmonary embolism and was discharged on Coumadin .  She then Jeanette Spencer presented to [facility] on 06/08/2024 with a syncopal event.  CT angio chest showed stable distribution and burden of extensive bilateral pulmonary emboli with positive for heart strain consistent with submassive PE.  New small pericardial effusion.  New multifocal ground glass opacities in the lungs which may represent pulmonary infarcts.  Patient underwent thrombectomy by vascular surgery.  She was again discharged on Coumadin .  Patient has not had any hypercoagulable workup done.   Results of Labs from 06/25/2024 showed no evidence of antiphospholipid antibody syndrome.  No evidence of prothrombin gene mutation or factor V Leiden.  Antithrombin III levels were normal.  Both protein C and protein S levels were decreased likely secondary to ongoing Coumadin  therapy  Interval history-she is presently compliant with Coumadin  and denies any complaints at this time  ECOG PS- 1 Pain scale- 0   Review of systems- Review of Systems  Constitutional:  Negative for chills, fever, malaise/fatigue and weight loss.  HENT:  Negative for  congestion, ear discharge and nosebleeds.   Eyes:  Negative for blurred vision.  Respiratory:  Negative for cough, hemoptysis, sputum production, shortness of breath and wheezing.   Cardiovascular:  Negative for chest pain, palpitations, orthopnea and claudication.  Gastrointestinal:  Negative for abdominal pain, blood in stool, constipation, diarrhea, heartburn, melena, nausea and vomiting.  Genitourinary:  Negative for dysuria, flank pain, frequency, hematuria and urgency.  Musculoskeletal:  Negative for back pain, joint pain and myalgias.  Skin:  Negative for rash.  Neurological:  Negative for dizziness, tingling, focal weakness, seizures, weakness and headaches.  Endo/Heme/Allergies:  Does not bruise/bleed easily.  Psychiatric/Behavioral:  Negative for depression and suicidal ideas. The patient does not have insomnia.       Allergies  Allergen Reactions   Oxycodone      Stops her breathing   Fentanyl  Nausea Only   Lipitor [Atorvastatin ]     Affected balance   Penicillin G     Other reaction(s): Unknown     Past Medical History:  Diagnosis Date   Allergy    Arthritis    knees   Chicken pox    Chronic bronchitis (HCC)    Diabetes mellitus without complication (HCC)    type 2   Hypertension    Phlebitis    Pulmonary embolism (HCC) 1990   after abdominal tumor removal   Vertigo      Past Surgical History:  Procedure Laterality Date   ABDOMINAL HYSTERECTOMY     abdominal tumor Left    size of grapefruit   COLONOSCOPY WITH PROPOFOL  N/A 09/24/2018   Procedure: COLONOSCOPY WITH BIOPSY;  Surgeon: Jinny Carmine, MD;  Location: [facility];  Service: Endoscopy;  Laterality: N/A;  diabetic - oral meds  requests arrival after 8:30   IR ANGIOGRAM PULMONARY BILATERAL SELECTIVE  06/08/2024   IR ANGIOGRAM SELECTIVE EACH ADDITIONAL VESSEL  06/08/2024   IR ANGIOGRAM SELECTIVE EACH ADDITIONAL VESSEL  06/08/2024   IR THROMBECT PRIM MECH ADD (INCLU) MOD SED  06/08/2024   IR  THROMBECT PRIM MECH ADD (INCLU) MOD SED  06/08/2024   IR THROMBECT PRIM MECH INIT (INCLU) MOD SED  06/08/2024   IR US  GUIDE VASC ACCESS RIGHT  06/08/2024   POLYPECTOMY N/A 09/24/2018   Procedure: POLYPECTOMY;  Surgeon: Jinny Carmine, MD;  Location: [facility];  Service: Endoscopy;  Laterality: N/A;   TONSILECTOMY, ADENOIDECTOMY, BILATERAL MYRINGOTOMY AND TUBES      Social History   Socioeconomic History   Marital status: Single    Spouse name: Not on file   Number of children: Not on file   Years of education: Not on file   Highest education level: Not on file  Occupational History   Not on file  Tobacco Use   Smoking status: Never   Smokeless tobacco: Never  Vaping Use   Vaping status: Never Used  Substance and Sexual Activity   Alcohol use: Yes    Comment:  rarely - Holidays   Drug use: No   Sexual activity: Never  Other Topics Concern   Not on file  Social History Narrative   Not on file   Social Drivers of Health   Financial Resource Strain: Low Risk  (03/25/2021)   Overall Financial Resource Strain (CARDIA)    Difficulty of Paying Living Expenses: Not hard at all  Food Insecurity: No Food Insecurity (06/25/2024)   Hunger Vital Sign    Worried About Running Out of Food in the Last Year: Never true    Ran Out of Food in the Last Year: Never true  Transportation Needs: No Transportation Needs (06/25/2024)   PRAPARE - Administrator, Civil Service (Medical): No    Lack of Transportation (Non-Medical): No  Physical Activity: Unknown (08/10/2020)   Exercise Vital Sign    Days of Exercise per Week: 0 days    Minutes of Exercise per Session: Not on file  Stress: Stress Concern Present (03/25/2021)   [employer] of Occupational Health - Occupational Stress Questionnaire    Feeling of Stress : Rather much  Social Connections: Socially Isolated (06/10/2024)   Social Connection and Isolation Panel    Frequency of Communication with Friends and Family:  More than three times a week    Frequency of Social Gatherings with Friends and Family: More than three times a week    Attends Religious Services: Never    Database administrator or Organizations: No    Attends Banker Meetings: Never    Marital Status: Widowed  Intimate Partner Violence: Not At Risk (06/25/2024)   Humiliation, Afraid, Rape, and Kick questionnaire    Fear of Current or Ex-Partner: No    Emotionally Abused: No    Physically Abused: No    Sexually Abused: No    Family History  Problem Relation Age of Onset   Diabetes Mother    Cancer Father    Cancer Paternal Aunt    Cancer Paternal Uncle    Diabetes Paternal Grandmother      Current Outpatient Medications:    atorvastatin  (LIPITOR) 20 MG tablet, Take 1 tablet by mouth once daily, Disp: 90 tablet, Rfl: 3   Cholecalciferol (VITAMIN D -3) 25 MCG (1000  UT) CAPS, Take 1 capsule by mouth daily., Disp: , Rfl:    cyanocobalamin  (VITAMIN B12) 1000 MCG tablet, Take 1,000 mcg by mouth daily., Disp: , Rfl:    cyanocobalamin  1000 MCG tablet, Take 1 tablet (1,000 mcg total) by mouth daily., Disp: 30 tablet, Rfl: 0   docusate sodium  (COLACE) 100 MG capsule, Take 100 mg by mouth daily., Disp: , Rfl:    glipiZIDE  (GLUCOTROL  XL) 2.5 MG 24 hr tablet, Take 2.5 mg by mouth at bedtime., Disp: , Rfl:    glipiZIDE  (GLUCOTROL ) 5 MG tablet, Take 5 mg by mouth daily before breakfast., Disp: , Rfl:    glucose blood test strip, Use as instructed to check sugars twice daily.  Uncontrolled diabetes mellitus E11.65, Disp: 100 each, Rfl: 12   losartan  (COZAAR ) 50 MG tablet, Take 1 tablet (50 mg total) by mouth daily., Disp: 90 tablet, Rfl: 1   metFORMIN  (GLUCOPHAGE ) 500 MG tablet, TAKE 1 TABLET BY MOUTH TWICE DAILY WITH A MEAL, Disp: 180 tablet, Rfl: 0   Omega-3 Fatty Acids (FISH OIL) 1000 MG CAPS, Take 1 capsule by mouth daily., Disp: , Rfl:    warfarin (COUMADIN ) 5 MG tablet, Take 5 mg by mouth daily., Disp: , Rfl:    amLODipine   (NORVASC ) 2.5 MG tablet, Take 2.5 mg by mouth daily. (Patient not taking: Reported on 07/16/2024), Disp: , Rfl:   Physical exam:  Vitals:   07/16/24 0934  BP: (!) 142/72  Pulse: 68  Resp: 20  Temp: (!) 96.3 F (35.7 C)  SpO2: 100%  Weight: 178 lb 3.2 oz (80.8 kg)   Physical Exam Cardiovascular:     Rate and Rhythm: Normal rate and regular rhythm.     Heart sounds: Normal heart sounds.  Pulmonary:     Effort: Pulmonary effort is normal.     Breath sounds: Normal breath sounds.  Skin:    General: Skin is warm and dry.  Neurological:     Mental Status: She is alert and oriented to person, place, and time.      I have personally reviewed labs listed below:    Latest Ref Rng & Units 06/15/2024   11:20 AM  CMP  Glucose 70 - 99 mg/dL 749   BUN 8 - 23 mg/dL 26   Creatinine 9.55 - 1.00 mg/dL 8.75   Sodium 864 - 854 mmol/L 136   Potassium 3.5 - 5.1 mmol/L 4.6   Chloride 98 - 111 mmol/L 104   CO2 22 - 32 mmol/L 22   Calcium  8.9 - 10.3 mg/dL 9.3       Latest Ref Rng & Units 06/15/2024   11:20 AM  CBC  WBC 4.0 - 10.5 K/uL 8.9   Hemoglobin 12.0 - 15.0 g/dL 89.0   Hematocrit 63.9 - 46.0 % 35.4   Platelets 150 - 400 K/uL 311      Assessment and plan- Patient is a 73 y.o. female with history of recurrent pulmonary embolism unprovoked presently on Coumadin  here for a routine follow-up visit  Extensive bilateral unprovoked pulmonary embolism Second episode of extensive bilateral pulmonary embolism, unprovoked. History of previous embolism on Coumadin . Thrombectomy performed. Hypercoagulable workup was otherwise unremarkable.  Protein C and protein S levels were decreased likely secondary to Coumadin .  Long-term anticoagulation with Coumadin  recommended due to recurrence and severity. - Continue Coumadin  indefinitely, managed by primary care physician. - Monitor for signs of bleeding while on Coumadin .  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 - Defer follow-up of lung findings to Dr. Isadora for further imaging necessity.  Chronic kidney disease, stage 3a Stage 3a with creatinine 1.24 and GFR 48. Avoided IV contrast in imaging due to renal concerns. - Order CT of the abdomen without IV contrast to avoid renal insult to rule out malignancy given second episode of extensive pulmonary embolism - Discuss CT abdomen results via phone unless concerning findings necessitate in-person visit.   Visit Diagnosis 1. Other acute pulmonary embolism with acute cor pulmonale (HCC)      Dr. Annah Skene, MD, MPH [facility] at [facility] [phone] 07/16/2024 12:22 PM

## 2024-07-18 ENCOUNTER — Ambulatory Visit (INDEPENDENT_AMBULATORY_CARE_PROVIDER_SITE_OTHER): Payer: Medicare (Managed Care) | Admitting: Student in an Organized Health Care Education/Training Program

## 2024-07-18 ENCOUNTER — Encounter: Payer: Self-pay | Admitting: Student in an Organized Health Care Education/Training Program

## 2024-07-18 VITALS — BP 130/70 | HR 79 | Temp 98.0°F | Ht 59.0 in | Wt 178.6 lb

## 2024-07-18 DIAGNOSIS — I2602 Saddle embolus of pulmonary artery with acute cor pulmonale: Secondary | ICD-10-CM | POA: Diagnosis not present

## 2024-07-18 DIAGNOSIS — Z7901 Long term (current) use of anticoagulants: Secondary | ICD-10-CM | POA: Diagnosis not present

## 2024-07-18 NOTE — Progress Notes (Signed)
 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 She is unable to afford apixaban and is on Coumadin  therapy with well-managed INR levels between 2 and 3. No dyspnea or chest pain present. Pulmonary infarction noted on CT scan while hospitalized (with also signs of mosaicism) is expected to resolve over time. Lifelong anticoagulation is necessary due to the risk of recurrent embolism. Continue Coumadin  therapy indefinitely and monitor INR levels weekly. No repeat CT scan of the lungs is needed.   -Reschedule echocardiogram for March to assess pulmonary pressures.   Return in about 7 months (around 02/15/2025).  Belva November, MD [facility]  I spent 30 minutes caring for this patient today, including preparing to see the patient, obtaining a medical history , reviewing a separately obtained history, performing a medically appropriate examination and/or evaluation, counseling and educating the patient/family/caregiver, ordering medications, tests, or procedures, documenting clinical information in the electronic health record, and independently interpreting results (not separately reported/billed) and communicating results to the patient/family/caregiver  End of visit medications:  No orders of the defined types were placed in this encounter.    Current Outpatient Medications:    atorvastatin  (LIPITOR) 20 MG tablet, Take 1 tablet by mouth once daily, Disp: 90 tablet, Rfl: 3   Cholecalciferol (VITAMIN D -3) 25 MCG (1000 UT) CAPS, Take 1 capsule by mouth daily., Disp: , Rfl:    cyanocobalamin  (VITAMIN B12) 1000 MCG tablet, Take 1,000 mcg by mouth daily., Disp: , Rfl:    docusate sodium  (COLACE) 100 MG capsule, Take 100 mg by mouth daily., Disp: , Rfl:     glipiZIDE  (GLUCOTROL  XL) 2.5 MG 24 hr tablet, Take 2.5 mg by mouth at bedtime., Disp: , Rfl:    glipiZIDE  (GLUCOTROL ) 5 MG tablet, Take 5 mg by mouth daily before breakfast., Disp: , Rfl:    glucose blood test strip, Use as instructed to check sugars twice daily.  Uncontrolled diabetes mellitus E11.65, Disp: 100 each, Rfl: 12   guaiFENesin -codeine  100-10 MG/5ML syrup, Take 5 mLs by mouth every 6 (six) hours as needed., Disp: , Rfl:    losartan  (COZAAR ) 50 MG tablet, Take 1 tablet (50 mg total) by mouth daily., Disp: 90 tablet, Rfl: 1   metFORMIN  (GLUCOPHAGE ) 500 MG tablet, TAKE 1 TABLET BY MOUTH TWICE DAILY WITH A MEAL, Disp: 180 tablet, Rfl: 0   Omega-3 Fatty Acids (FISH OIL) 1000 MG CAPS, Take 1 capsule by mouth daily., Disp: , Rfl:    warfarin (COUMADIN ) 5 MG tablet, Take 5 mg by mouth daily., Disp: , Rfl:    amLODipine  (NORVASC ) 2.5 MG tablet, Take 2.5 mg by mouth daily. (Patient not taking: Reported on 07/18/2024), Disp: , Rfl:    Subjective:   PATIENT ID: Jeanette Spencer GENDER: female DOB: [birth year], MRN: [id number]  Chief Complaint  Patient presents with   Follow-up    No SOB or wheezing. Cough with clear sputum.     HPI  Jeanette Spencer is a 73 year old female with a recent pulmonary embolism who presents for outpatient follow-up.  Initial Visit 06/05/2024:   She was recently admitted to the hospital in [place].  Symptom onset was a couple weeks ago with increasing shortness of breath with exertion. Close to her hospitalization she was feeling short of breath with any activity.  She was also starting to feel weak and was more dizzy.  She went to the grocery store where she got dizzy and syncopized without hitting her head. EMS was activated and she was transferred to the nearest hospital.   In the hospital, a CT scan of the chest with PE protocol was positive for pulmonary embolism.  She was started on anticoagulation and discharged on Eliquis which she could  not afford and was subsequently transition to warfarin by her PCP.  An echocardiogram was performed while hospitalized which showed normal LV and RV function but with elevated RVSP of 75 mmHg. Since discharge, patient has been seen by her PCP and transition to warfarin. I don't have records from the hospitalization, and I am unable to review the echocardiogram images nor blood work. We were able to port her chest CT via powershare for review. I was able to review the last clinic note from her PCP.   Patient currently reports some shortness of breath and feels weak. She does not report any history of recent air travel or long car rides. She has no cough and no hemoptysis. No signs of bleeding are reported. She does not have any personal history of malignancy.  She does report having had pulmonary embolism in 1990.  She reports that her PCP manages her cancer screening.  Return Visit 07/18/2024:  Discussed the use of AI scribe software for clinical note transcription with the patient, who gave verbal consent to proceed.  She was initially admitted to [facility] in early September for a pulmonary embolism and was treated with anticoagulation. She followed up in the clinic on September 10th. However, she was readmitted to [facility] on September 13th with an intermediate high-risk pulmonary embolism. An echocardiogram performed on the same day showed mildly reduced right ventricular function. She underwent a thrombectomy, which resulted in improvement of her symptoms, and was discharged on Coumadin  therapy.  She feels 'pretty good' since the procedure, with no shortness of breath, chest pain, or chest tightness. She is currently on Coumadin , with her INR being monitored weekly at home. Her INR levels have been stable between 2 and 3, although there was a recent adjustment from 7.5 mg to 5 mg of Coumadin  to maintain appropriate INR levels.  She was seen by hematology on October 21st. A CT scan  of her abdomen is scheduled for October 30th as hematology is investigating the cause of her blood clots.       Patient denies any history of smoking.  She previously worked at a 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   Her past medical history is notable for hypertension, CKD, hyperlipidemia, type 2 diabetes, PE in 1990, and recent pulmonary embolism.  Ancillary information including prior medications, full medical/surgical/family/social histories, and PFTs (when available) are listed below and have been reviewed.    Review of Systems  Constitutional:  Negative for chills, fever, malaise/fatigue and weight loss.  Respiratory:  Negative for cough, hemoptysis, sputum production, shortness of breath and wheezing.   Cardiovascular:  Negative for chest pain.     Objective:   Vitals:   07/18/24 1318  BP: 130/70  Pulse: 79  Temp: 98 F (36.7 C)  SpO2: 98%  Weight: 178 lb 9.6 oz (81 kg)  Height: 4' 11 (1.499 m)   98% on RA BMI Readings from Last 3 Encounters:  07/18/24 36.07 kg/m  07/16/24 35.99 kg/m  06/25/24 36.34 kg/m   Wt  Readings from Last 3 Encounters:  07/18/24 178 lb 9.6 oz (81 kg)  07/16/24 178 lb 3.2 oz (80.8 kg)  06/25/24 179 lb 14.4 oz (81.6 kg)    Physical Exam Constitutional:      Appearance: Normal appearance.  Cardiovascular:     Rate and Rhythm: Normal rate and regular rhythm.     Pulses: Normal pulses.     Heart sounds: Normal heart sounds.  Pulmonary:     Effort: Pulmonary effort is normal.     Breath sounds: Normal breath sounds. No wheezing or rales.  Neurological:     General: No focal deficit present.     Mental Status: She is alert and oriented to person, place, and time. Mental status is at baseline.       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   Vertigo      Family History  Problem Relation Age of Onset   Diabetes Mother    Cancer Father    Cancer Paternal Aunt    Cancer Paternal Uncle    Diabetes Paternal Grandmother      Past Surgical History:  Procedure Laterality Date   ABDOMINAL HYSTERECTOMY     abdominal tumor Left    size of grapefruit   COLONOSCOPY WITH PROPOFOL  N/A 09/24/2018   Procedure: COLONOSCOPY WITH BIOPSY;  Surgeon: Jinny Carmine, MD;  Location: [facility];  Service: Endoscopy;  Laterality: N/A;  diabetic - oral meds  requests arrival after 8:30   IR ANGIOGRAM PULMONARY BILATERAL SELECTIVE  06/08/2024   IR ANGIOGRAM SELECTIVE EACH ADDITIONAL VESSEL  06/08/2024   IR ANGIOGRAM SELECTIVE EACH ADDITIONAL VESSEL  06/08/2024   IR THROMBECT PRIM MECH ADD (INCLU) MOD SED  06/08/2024   IR THROMBECT PRIM MECH ADD (INCLU) MOD SED  06/08/2024   IR THROMBECT PRIM MECH INIT (INCLU) MOD SED  06/08/2024   IR US  GUIDE VASC ACCESS RIGHT  06/08/2024   POLYPECTOMY N/A 09/24/2018   Procedure: POLYPECTOMY;  Surgeon: Jinny Carmine, MD;  Location: [facility];  Service: Endoscopy;  Laterality: N/A;   TONSILECTOMY, ADENOIDECTOMY, BILATERAL MYRINGOTOMY AND TUBES      Social History   Socioeconomic History   Marital status: Single    Spouse name: Not on file   Number of children: Not on file   Years of education: Not on file   Highest education level: Not on file  Occupational History   Not on file  Tobacco Use   Smoking status: Never   Smokeless tobacco: Never  Vaping Use   Vaping status: Never Used  Substance and Sexual Activity   Alcohol use: Yes    Comment:  rarely - Holidays   Drug use: No   Sexual activity: Never  Other Topics Concern   Not on file  Social History Narrative   Not on file   Social Drivers of Health   Financial Resource Strain: Low Risk  (03/25/2021)   Overall  Financial Resource Strain (CARDIA)    Difficulty of Paying Living Expenses: Not hard at all  Food Insecurity: No Food Insecurity (06/25/2024)   Hunger Vital Sign    Worried About Running Out of Food  in the Last Year: Never true    Ran Out of Food in the Last Year: Never true  Transportation Needs: No Transportation Needs (06/25/2024)   PRAPARE - Administrator, Civil Service (Medical): No    Lack of Transportation (Non-Medical): No  Physical Activity: Unknown (08/10/2020)   Exercise Vital Sign    Days of Exercise per Week: 0 days    Minutes of Exercise per Session: Not on file  Stress: Stress Concern Present (03/25/2021)   [employer] of Occupational Health - Occupational Stress Questionnaire    Feeling of Stress : Rather much  Social Connections: Socially Isolated (06/10/2024)   Social Connection and Isolation Panel    Frequency of Communication with Friends and Family: More than three times a week    Frequency of Social Gatherings with Friends and Family: More than three times a week    Attends Religious Services: Never    Database administrator or Organizations: No    Attends Banker Meetings: Never    Marital Status: Widowed  Intimate Partner Violence: Not At Risk (06/25/2024)   Humiliation, Afraid, Rape, and Kick questionnaire    Fear of Current or Ex-Partner: No    Emotionally Abused: No    Physically Abused: No    Sexually Abused: No     Allergies  Allergen Reactions   Oxycodone      Stops her breathing   Fentanyl  Nausea Only   Lipitor [Atorvastatin ]     Affected balance   Penicillin G     Other reaction(s): Unknown     CBC    Component Value Date/Time   WBC 8.9 06/15/2024 1120   RBC 4.15 06/15/2024 1120   HGB 10.9 (L) 06/15/2024 1120   HCT 35.4 (L) 06/15/2024 1120   PLT 311 06/15/2024 1120   MCV 85.3 06/15/2024 1120   MCH 26.3 06/15/2024 1120   MCHC 30.8 06/15/2024 1120   RDW 15.5 06/15/2024 1120   LYMPHSABS 3.5 06/08/2024 0323    MONOABS 0.6 06/08/2024 0323   EOSABS 0.0 06/08/2024 0323   BASOSABS 0.0 06/08/2024 0323    Pulmonary Functions Testing Results:     No data to display          Outpatient Medications Prior to Visit  Medication Sig Dispense Refill   atorvastatin  (LIPITOR) 20 MG tablet Take 1 tablet by mouth once daily 90 tablet 3   Cholecalciferol (VITAMIN D -3) 25 MCG (1000 UT) CAPS Take 1 capsule by mouth daily.     cyanocobalamin  (VITAMIN B12) 1000 MCG tablet Take 1,000 mcg by mouth daily.     docusate sodium  (COLACE) 100 MG capsule Take 100 mg by mouth daily.     glipiZIDE  (GLUCOTROL  XL) 2.5 MG 24 hr tablet Take 2.5 mg by mouth at bedtime.     glipiZIDE  (GLUCOTROL ) 5 MG tablet Take 5 mg by mouth daily before breakfast.     glucose blood test strip Use as instructed to check sugars twice daily.  Uncontrolled diabetes mellitus E11.65 100 each 12   guaiFENesin -codeine  100-10 MG/5ML syrup Take 5 mLs by mouth every 6 (six) hours as needed.     losartan  (COZAAR ) 50 MG tablet Take 1 tablet (50 mg total) by mouth daily. 90 tablet 1   metFORMIN  (GLUCOPHAGE ) 500 MG tablet TAKE 1 TABLET BY MOUTH TWICE DAILY WITH A MEAL 180 tablet 0   Omega-3 Fatty Acids (FISH OIL) 1000 MG CAPS Take 1 capsule by mouth daily.     warfarin (COUMADIN )  5 MG tablet Take 5 mg by mouth daily.     amLODipine  (NORVASC ) 2.5 MG tablet Take 2.5 mg by mouth daily. (Patient not taking: Reported on 07/18/2024)     No facility-administered medications prior to visit.

## 2024-07-18 NOTE — Patient Instructions (Signed)
  VISIT SUMMARY: You had a follow-up appointment today to check on your recovery from a recent pulmonary embolism and the treatment you received. You reported feeling pretty good with no shortness of breath, chest pain, or chest tightness. Your INR levels have been stable, and you are continuing your Coumadin  therapy. A CT scan of your abdomen is scheduled for October 30th to investigate the cause of your blood clots.  YOUR PLAN: -PULMONARY EMBOLISM STATUS POST THROMBECTOMY WITH PULMONARY INFARCTION: A pulmonary embolism is a blockage in one of the pulmonary arteries in your lungs, often caused by blood clots. You had a thrombectomy to remove the clot, which improved your symptoms. You are on Coumadin  therapy to prevent future clots, and your INR levels are being monitored weekly. Your pulmonary infarction, which is damage to lung tissue due to the clot, is expected to resolve over time, though some residual damage may occur. Continue taking Coumadin  indefinitely and monitor your INR levels weekly. No repeat CT scan of the lungs is needed. We will reschedule an echocardiogram for March to assess your pulmonary pressures.  -INTERMEDIATE HIGH RISK PULMONARY EMBOLISM WITH MILDLY REDUCED RIGHT VENTRICULAR FUNCTION: An intermediate high-risk pulmonary embolism is a serious condition where a blood clot blocks an artery in the lungs, affecting heart function. Your right ventricular function was mildly reduced, but this should improve over time. We will reschedule an echocardiogram for March to reassess your heart function.  INSTRUCTIONS: Continue taking Coumadin  as prescribed and monitor your INR levels weekly. Attend your CT scan appointment on October 30th. We will reschedule an echocardiogram for March to assess your pulmonary pressures and right ventricular function.

## 2024-07-19 NOTE — Telephone Encounter (Signed)
 Jeanette Spencer echo has been rescheduled for 12/12/2024 @ 9:00am it was rescheduled while she was in the office

## 2024-07-24 ENCOUNTER — Ambulatory Visit: Payer: Medicare (Managed Care)

## 2024-07-25 ENCOUNTER — Ambulatory Visit
Admission: RE | Admit: 2024-07-25 | Discharge: 2024-07-25 | Disposition: A | Discharge status: Home / Self Care | Payer: Medicare (Managed Care) | Source: Ambulatory Visit | Attending: Oncology | Admitting: Oncology

## 2024-07-25 DIAGNOSIS — N261 Atrophy of kidney (terminal): Secondary | ICD-10-CM | POA: Insufficient documentation

## 2024-07-25 DIAGNOSIS — C801 Malignant (primary) neoplasm, unspecified: Secondary | ICD-10-CM | POA: Diagnosis not present

## 2024-07-25 DIAGNOSIS — I2609 Other pulmonary embolism with acute cor pulmonale: Secondary | ICD-10-CM | POA: Diagnosis present

## 2024-07-25 DIAGNOSIS — K5730 Diverticulosis of large intestine without perforation or abscess without bleeding: Secondary | ICD-10-CM | POA: Insufficient documentation

## 2024-07-29 ENCOUNTER — Ambulatory Visit: Payer: Self-pay | Admitting: Oncology

## 2024-07-30 NOTE — Telephone Encounter (Signed)
 Per Dr. Melanee Please let her know CT abdomen which was done to see if there is any concern for malignancy given her history of blood clot was negative. Her left kidney appears shrunken which may not be a new finding and she can discuss this with her nephrologist. She does not require any follow-up with me in the future. Outbound call to patient; informed of above.  Has no further questions / concerns at this time.

## 2024-07-30 NOTE — Telephone Encounter (Signed)
-----   Message from Jeanette Spencer sent at 07/29/2024  8:35 AM EST ----- Please let her know CT abdomen which was done to see if there is any concern for malignancy given her history of blood clot was negative.  Her left kidney appears shrunken which may not be a new  finding and she can discuss this with her nephrologist.  She does not require any follow-up with me in the future. ----- Message ----- From: Interface, Rad Results In Sent: 07/27/2024   1:00 PM EST To: Jeanette JAYSON Skene, MD

## 2024-09-17 ENCOUNTER — Other Ambulatory Visit: Payer: Self-pay | Admitting: Medical Genetics

## 2024-09-30 ENCOUNTER — Other Ambulatory Visit
Admission: RE | Admit: 2024-09-30 | Discharge: 2024-09-30 | Disposition: A | Discharge status: Home / Self Care | Payer: Medicare (Managed Care) | Source: Ambulatory Visit | Attending: Medical Genetics | Admitting: Medical Genetics

## 2024-10-08 LAB — GENECONNECT MOLECULAR SCREEN: Genetic Analysis Overall Interpretation: NEGATIVE

## 2024-10-14 ENCOUNTER — Other Ambulatory Visit (INDEPENDENT_AMBULATORY_CARE_PROVIDER_SITE_OTHER): Payer: Self-pay | Admitting: Nephrology

## 2024-10-14 ENCOUNTER — Ambulatory Visit (INDEPENDENT_AMBULATORY_CARE_PROVIDER_SITE_OTHER): Payer: Medicare (Managed Care)

## 2024-10-14 DIAGNOSIS — Z794 Long term (current) use of insulin: Secondary | ICD-10-CM

## 2024-10-14 DIAGNOSIS — N1832 Chronic kidney disease, stage 3b: Secondary | ICD-10-CM | POA: Diagnosis not present

## 2024-10-14 DIAGNOSIS — E1122 Type 2 diabetes mellitus with diabetic chronic kidney disease: Secondary | ICD-10-CM | POA: Diagnosis not present

## 2024-10-14 DIAGNOSIS — N182 Chronic kidney disease, stage 2 (mild): Secondary | ICD-10-CM | POA: Diagnosis not present

## 2024-12-12 ENCOUNTER — Ambulatory Visit: Payer: Medicare (Managed Care)
